# Patient Record
Sex: Male | Born: 1935
Health system: Southern US, Community
[De-identification: ages and names within clinical notes are randomized; demographics above are authoritative.]

## PROBLEM LIST (undated history)

## (undated) DIAGNOSIS — U071 COVID-19: Secondary | ICD-10-CM

## (undated) DIAGNOSIS — F0390 Unspecified dementia without behavioral disturbance: Secondary | ICD-10-CM

## (undated) DIAGNOSIS — J449 Chronic obstructive pulmonary disease, unspecified: Secondary | ICD-10-CM

## (undated) DIAGNOSIS — E785 Hyperlipidemia, unspecified: Secondary | ICD-10-CM

## (undated) DIAGNOSIS — J342 Deviated nasal septum: Secondary | ICD-10-CM

## (undated) DIAGNOSIS — R911 Solitary pulmonary nodule: Secondary | ICD-10-CM

## (undated) DIAGNOSIS — K219 Gastro-esophageal reflux disease without esophagitis: Secondary | ICD-10-CM

## (undated) DIAGNOSIS — N400 Enlarged prostate without lower urinary tract symptoms: Secondary | ICD-10-CM

## (undated) DIAGNOSIS — H269 Unspecified cataract: Secondary | ICD-10-CM

## (undated) DIAGNOSIS — S42009A Fracture of unspecified part of unspecified clavicle, initial encounter for closed fracture: Secondary | ICD-10-CM

## (undated) DIAGNOSIS — H9319 Tinnitus, unspecified ear: Secondary | ICD-10-CM

## (undated) DIAGNOSIS — H9190 Unspecified hearing loss, unspecified ear: Secondary | ICD-10-CM

## (undated) HISTORY — PX: SHOULDER SURGERY: SHX246

## (undated) HISTORY — PX: APPENDECTOMY: SHX54

---

## 2000-12-30 ENCOUNTER — Other Ambulatory Visit: Admission: RE | Admit: 2000-12-30 | Discharge: 2000-12-30 | Payer: Self-pay | Admitting: Gastroenterology

## 2000-12-30 ENCOUNTER — Encounter (INDEPENDENT_AMBULATORY_CARE_PROVIDER_SITE_OTHER): Payer: Self-pay | Admitting: Specialist

## 2007-01-07 ENCOUNTER — Encounter: Admission: RE | Admit: 2007-01-07 | Discharge: 2007-01-07 | Payer: Self-pay | Admitting: Family Medicine

## 2010-05-09 ENCOUNTER — Encounter (INDEPENDENT_AMBULATORY_CARE_PROVIDER_SITE_OTHER): Payer: Self-pay | Admitting: Surgery

## 2010-07-03 ENCOUNTER — Observation Stay (HOSPITAL_COMMUNITY): Admission: EM | Admit: 2010-07-03 | Discharge: 2010-05-11 | Payer: Self-pay | Admitting: Emergency Medicine

## 2010-08-12 ENCOUNTER — Other Ambulatory Visit: Payer: Self-pay | Admitting: Gastroenterology

## 2010-10-09 LAB — COMPREHENSIVE METABOLIC PANEL
ALT: 16 U/L (ref 0–53)
Albumin: 4.1 g/dL (ref 3.5–5.2)
Alkaline Phosphatase: 78 U/L (ref 39–117)
Calcium: 8.8 mg/dL (ref 8.4–10.5)
GFR calc Af Amer: >60 mL/min (ref >60)
Potassium: 4.1 mEq/L (ref 3.5–5.1)
Sodium: 134 mEq/L — ABNORMAL LOW (ref 135–145)
Total Protein: 6.9 g/dL (ref 6.0–8.3)

## 2010-10-09 LAB — CBC
HCT: 45.8 % (ref 39.0–52.0)
MCHC: 34.1 g/dL (ref 30.0–36.0)
Platelets: 201 10*3/uL (ref 150–400)
RDW: 13.3 % (ref 11.5–15.5)
WBC: 11.6 10*3/uL — ABNORMAL HIGH (ref 4.0–10.5)

## 2010-10-09 LAB — URINE MICROSCOPIC-ADD ON

## 2010-10-09 LAB — URINALYSIS, ROUTINE W REFLEX MICROSCOPIC
Bilirubin Urine: NEGATIVE
Glucose, UA: NEGATIVE mg/dL
Hgb urine dipstick: NEGATIVE
Specific Gravity, Urine: 1.025 (ref 1.005–1.030)
Urobilinogen, UA: 1 mg/dL (ref 0.0–1.0)

## 2010-10-09 LAB — DIFFERENTIAL
Basophils Relative: 0 % (ref 0–1)
Eosinophils Absolute: 0 10*3/uL (ref 0.0–0.7)
Lymphs Abs: 0.4 10*3/uL — ABNORMAL LOW (ref 0.7–4.0)
Monocytes Absolute: 0.3 10*3/uL (ref 0.1–1.0)
Monocytes Relative: 2 % — ABNORMAL LOW (ref 3–12)

## 2011-01-14 ENCOUNTER — Other Ambulatory Visit: Payer: Self-pay | Admitting: Dermatology

## 2011-02-10 ENCOUNTER — Encounter: Payer: Self-pay | Admitting: Gastroenterology

## 2011-04-14 ENCOUNTER — Other Ambulatory Visit: Payer: Self-pay | Admitting: Dermatology

## 2011-08-03 DIAGNOSIS — H6980 Other specified disorders of Eustachian tube, unspecified ear: Secondary | ICD-10-CM | POA: Diagnosis not present

## 2011-08-03 DIAGNOSIS — H9319 Tinnitus, unspecified ear: Secondary | ICD-10-CM | POA: Diagnosis not present

## 2011-09-15 DIAGNOSIS — H9190 Unspecified hearing loss, unspecified ear: Secondary | ICD-10-CM | POA: Diagnosis not present

## 2011-10-07 DIAGNOSIS — H903 Sensorineural hearing loss, bilateral: Secondary | ICD-10-CM | POA: Diagnosis not present

## 2011-10-13 DIAGNOSIS — L570 Actinic keratosis: Secondary | ICD-10-CM | POA: Diagnosis not present

## 2011-10-13 DIAGNOSIS — L821 Other seborrheic keratosis: Secondary | ICD-10-CM | POA: Diagnosis not present

## 2011-10-13 DIAGNOSIS — Z85828 Personal history of other malignant neoplasm of skin: Secondary | ICD-10-CM | POA: Diagnosis not present

## 2011-10-13 DIAGNOSIS — Z85820 Personal history of malignant melanoma of skin: Secondary | ICD-10-CM | POA: Diagnosis not present

## 2012-01-06 DIAGNOSIS — L821 Other seborrheic keratosis: Secondary | ICD-10-CM | POA: Diagnosis not present

## 2012-01-06 DIAGNOSIS — Z85820 Personal history of malignant melanoma of skin: Secondary | ICD-10-CM | POA: Diagnosis not present

## 2012-01-06 DIAGNOSIS — L570 Actinic keratosis: Secondary | ICD-10-CM | POA: Diagnosis not present

## 2012-02-10 DIAGNOSIS — H905 Unspecified sensorineural hearing loss: Secondary | ICD-10-CM | POA: Diagnosis not present

## 2012-02-10 DIAGNOSIS — H6980 Other specified disorders of Eustachian tube, unspecified ear: Secondary | ICD-10-CM | POA: Diagnosis not present

## 2012-02-10 DIAGNOSIS — J329 Chronic sinusitis, unspecified: Secondary | ICD-10-CM | POA: Diagnosis not present

## 2012-02-10 DIAGNOSIS — J301 Allergic rhinitis due to pollen: Secondary | ICD-10-CM | POA: Diagnosis not present

## 2012-02-24 ENCOUNTER — Other Ambulatory Visit: Payer: Self-pay | Admitting: *Deleted

## 2012-02-24 ENCOUNTER — Ambulatory Visit
Admission: RE | Admit: 2012-02-24 | Discharge: 2012-02-24 | Disposition: A | Discharge status: Home / Self Care | Payer: Medicare Other | Source: Ambulatory Visit | Attending: *Deleted | Admitting: *Deleted

## 2012-02-24 DIAGNOSIS — M79609 Pain in unspecified limb: Secondary | ICD-10-CM | POA: Diagnosis not present

## 2012-02-24 DIAGNOSIS — R609 Edema, unspecified: Secondary | ICD-10-CM | POA: Diagnosis not present

## 2012-02-24 DIAGNOSIS — M7989 Other specified soft tissue disorders: Secondary | ICD-10-CM | POA: Diagnosis not present

## 2012-04-11 DIAGNOSIS — Z85820 Personal history of malignant melanoma of skin: Secondary | ICD-10-CM | POA: Diagnosis not present

## 2012-04-11 DIAGNOSIS — L570 Actinic keratosis: Secondary | ICD-10-CM | POA: Diagnosis not present

## 2012-04-11 DIAGNOSIS — L821 Other seborrheic keratosis: Secondary | ICD-10-CM | POA: Diagnosis not present

## 2012-04-11 DIAGNOSIS — D485 Neoplasm of uncertain behavior of skin: Secondary | ICD-10-CM | POA: Diagnosis not present

## 2012-04-14 ENCOUNTER — Encounter: Payer: Self-pay | Admitting: Gastroenterology

## 2012-05-04 DIAGNOSIS — L570 Actinic keratosis: Secondary | ICD-10-CM | POA: Diagnosis not present

## 2012-05-04 DIAGNOSIS — J310 Chronic rhinitis: Secondary | ICD-10-CM | POA: Diagnosis not present

## 2012-05-04 DIAGNOSIS — H9190 Unspecified hearing loss, unspecified ear: Secondary | ICD-10-CM | POA: Diagnosis not present

## 2012-05-04 DIAGNOSIS — Z0000 Encounter for general adult medical examination without abnormal findings: Secondary | ICD-10-CM | POA: Diagnosis not present

## 2012-05-04 DIAGNOSIS — Z23 Encounter for immunization: Secondary | ICD-10-CM | POA: Diagnosis not present

## 2012-05-04 DIAGNOSIS — Z86010 Personal history of colonic polyps: Secondary | ICD-10-CM | POA: Diagnosis not present

## 2012-05-04 DIAGNOSIS — Z1331 Encounter for screening for depression: Secondary | ICD-10-CM | POA: Diagnosis not present

## 2012-05-04 DIAGNOSIS — R351 Nocturia: Secondary | ICD-10-CM | POA: Diagnosis not present

## 2012-05-04 DIAGNOSIS — E785 Hyperlipidemia, unspecified: Secondary | ICD-10-CM | POA: Diagnosis not present

## 2012-07-21 DIAGNOSIS — L821 Other seborrheic keratosis: Secondary | ICD-10-CM | POA: Diagnosis not present

## 2012-07-21 DIAGNOSIS — L570 Actinic keratosis: Secondary | ICD-10-CM | POA: Diagnosis not present

## 2012-07-21 DIAGNOSIS — D485 Neoplasm of uncertain behavior of skin: Secondary | ICD-10-CM | POA: Diagnosis not present

## 2012-07-21 DIAGNOSIS — C44621 Squamous cell carcinoma of skin of unspecified upper limb, including shoulder: Secondary | ICD-10-CM | POA: Diagnosis not present

## 2012-07-29 DIAGNOSIS — H6980 Other specified disorders of Eustachian tube, unspecified ear: Secondary | ICD-10-CM | POA: Diagnosis not present

## 2012-07-29 DIAGNOSIS — H9319 Tinnitus, unspecified ear: Secondary | ICD-10-CM | POA: Diagnosis not present

## 2012-07-29 DIAGNOSIS — J3489 Other specified disorders of nose and nasal sinuses: Secondary | ICD-10-CM | POA: Diagnosis not present

## 2012-07-29 DIAGNOSIS — H903 Sensorineural hearing loss, bilateral: Secondary | ICD-10-CM | POA: Insufficient documentation

## 2012-07-29 DIAGNOSIS — J342 Deviated nasal septum: Secondary | ICD-10-CM | POA: Insufficient documentation

## 2012-07-29 DIAGNOSIS — J343 Hypertrophy of nasal turbinates: Secondary | ICD-10-CM | POA: Diagnosis not present

## 2012-07-29 DIAGNOSIS — H6990 Unspecified Eustachian tube disorder, unspecified ear: Secondary | ICD-10-CM | POA: Insufficient documentation

## 2012-08-05 DIAGNOSIS — H6980 Other specified disorders of Eustachian tube, unspecified ear: Secondary | ICD-10-CM | POA: Diagnosis not present

## 2012-08-05 DIAGNOSIS — J301 Allergic rhinitis due to pollen: Secondary | ICD-10-CM | POA: Diagnosis not present

## 2012-08-11 DIAGNOSIS — I498 Other specified cardiac arrhythmias: Secondary | ICD-10-CM | POA: Diagnosis not present

## 2012-08-11 DIAGNOSIS — Z01810 Encounter for preprocedural cardiovascular examination: Secondary | ICD-10-CM | POA: Diagnosis not present

## 2012-08-17 DIAGNOSIS — J343 Hypertrophy of nasal turbinates: Secondary | ICD-10-CM | POA: Diagnosis not present

## 2012-08-17 DIAGNOSIS — J342 Deviated nasal septum: Secondary | ICD-10-CM | POA: Diagnosis not present

## 2012-08-22 DIAGNOSIS — J343 Hypertrophy of nasal turbinates: Secondary | ICD-10-CM | POA: Diagnosis not present

## 2012-08-22 DIAGNOSIS — J342 Deviated nasal septum: Secondary | ICD-10-CM | POA: Diagnosis not present

## 2012-08-22 DIAGNOSIS — H9319 Tinnitus, unspecified ear: Secondary | ICD-10-CM | POA: Diagnosis not present

## 2012-08-22 DIAGNOSIS — J3489 Other specified disorders of nose and nasal sinuses: Secondary | ICD-10-CM | POA: Diagnosis not present

## 2012-08-22 DIAGNOSIS — H6980 Other specified disorders of Eustachian tube, unspecified ear: Secondary | ICD-10-CM | POA: Diagnosis not present

## 2012-08-22 DIAGNOSIS — H903 Sensorineural hearing loss, bilateral: Secondary | ICD-10-CM | POA: Diagnosis not present

## 2012-08-26 DIAGNOSIS — H903 Sensorineural hearing loss, bilateral: Secondary | ICD-10-CM | POA: Diagnosis not present

## 2012-10-05 ENCOUNTER — Other Ambulatory Visit: Payer: Self-pay | Admitting: Dermatology

## 2012-10-05 DIAGNOSIS — Z85828 Personal history of other malignant neoplasm of skin: Secondary | ICD-10-CM | POA: Diagnosis not present

## 2012-10-05 DIAGNOSIS — L820 Inflamed seborrheic keratosis: Secondary | ICD-10-CM | POA: Diagnosis not present

## 2012-10-05 DIAGNOSIS — D485 Neoplasm of uncertain behavior of skin: Secondary | ICD-10-CM | POA: Diagnosis not present

## 2012-10-05 DIAGNOSIS — D235 Other benign neoplasm of skin of trunk: Secondary | ICD-10-CM | POA: Diagnosis not present

## 2012-10-05 DIAGNOSIS — L570 Actinic keratosis: Secondary | ICD-10-CM | POA: Diagnosis not present

## 2012-10-20 DIAGNOSIS — H918X9 Other specified hearing loss, unspecified ear: Secondary | ICD-10-CM | POA: Diagnosis not present

## 2012-10-20 DIAGNOSIS — J3489 Other specified disorders of nose and nasal sinuses: Secondary | ICD-10-CM | POA: Diagnosis not present

## 2012-10-20 DIAGNOSIS — H6980 Other specified disorders of Eustachian tube, unspecified ear: Secondary | ICD-10-CM | POA: Diagnosis not present

## 2012-10-20 DIAGNOSIS — H9319 Tinnitus, unspecified ear: Secondary | ICD-10-CM | POA: Diagnosis not present

## 2013-01-05 DIAGNOSIS — L821 Other seborrheic keratosis: Secondary | ICD-10-CM | POA: Diagnosis not present

## 2013-01-05 DIAGNOSIS — Z85820 Personal history of malignant melanoma of skin: Secondary | ICD-10-CM | POA: Diagnosis not present

## 2013-01-05 DIAGNOSIS — L570 Actinic keratosis: Secondary | ICD-10-CM | POA: Diagnosis not present

## 2013-01-26 DIAGNOSIS — H2510 Age-related nuclear cataract, unspecified eye: Secondary | ICD-10-CM | POA: Diagnosis not present

## 2013-04-04 DIAGNOSIS — H6980 Other specified disorders of Eustachian tube, unspecified ear: Secondary | ICD-10-CM | POA: Diagnosis not present

## 2013-04-04 DIAGNOSIS — H903 Sensorineural hearing loss, bilateral: Secondary | ICD-10-CM | POA: Diagnosis not present

## 2013-04-04 DIAGNOSIS — J343 Hypertrophy of nasal turbinates: Secondary | ICD-10-CM | POA: Diagnosis not present

## 2013-04-04 DIAGNOSIS — J342 Deviated nasal septum: Secondary | ICD-10-CM | POA: Diagnosis not present

## 2013-04-04 DIAGNOSIS — H9319 Tinnitus, unspecified ear: Secondary | ICD-10-CM | POA: Diagnosis not present

## 2013-04-04 DIAGNOSIS — J3489 Other specified disorders of nose and nasal sinuses: Secondary | ICD-10-CM | POA: Diagnosis not present

## 2013-04-12 DIAGNOSIS — L570 Actinic keratosis: Secondary | ICD-10-CM | POA: Diagnosis not present

## 2013-04-12 DIAGNOSIS — Z85820 Personal history of malignant melanoma of skin: Secondary | ICD-10-CM | POA: Diagnosis not present

## 2013-04-12 DIAGNOSIS — L820 Inflamed seborrheic keratosis: Secondary | ICD-10-CM | POA: Diagnosis not present

## 2013-04-12 DIAGNOSIS — L821 Other seborrheic keratosis: Secondary | ICD-10-CM | POA: Diagnosis not present

## 2013-05-08 DIAGNOSIS — R351 Nocturia: Secondary | ICD-10-CM | POA: Diagnosis not present

## 2013-05-08 DIAGNOSIS — Z79899 Other long term (current) drug therapy: Secondary | ICD-10-CM | POA: Diagnosis not present

## 2013-05-08 DIAGNOSIS — Z86010 Personal history of colonic polyps: Secondary | ICD-10-CM | POA: Diagnosis not present

## 2013-05-08 DIAGNOSIS — L570 Actinic keratosis: Secondary | ICD-10-CM | POA: Diagnosis not present

## 2013-05-08 DIAGNOSIS — M1710 Unilateral primary osteoarthritis, unspecified knee: Secondary | ICD-10-CM | POA: Diagnosis not present

## 2013-05-08 DIAGNOSIS — Z1331 Encounter for screening for depression: Secondary | ICD-10-CM | POA: Diagnosis not present

## 2013-05-08 DIAGNOSIS — Z0000 Encounter for general adult medical examination without abnormal findings: Secondary | ICD-10-CM | POA: Diagnosis not present

## 2013-05-08 DIAGNOSIS — K5730 Diverticulosis of large intestine without perforation or abscess without bleeding: Secondary | ICD-10-CM | POA: Diagnosis not present

## 2013-05-08 DIAGNOSIS — E785 Hyperlipidemia, unspecified: Secondary | ICD-10-CM | POA: Diagnosis not present

## 2013-05-23 DIAGNOSIS — Z23 Encounter for immunization: Secondary | ICD-10-CM | POA: Diagnosis not present

## 2013-05-29 DIAGNOSIS — H903 Sensorineural hearing loss, bilateral: Secondary | ICD-10-CM | POA: Diagnosis not present

## 2013-07-03 DIAGNOSIS — Z85820 Personal history of malignant melanoma of skin: Secondary | ICD-10-CM | POA: Diagnosis not present

## 2013-07-03 DIAGNOSIS — L570 Actinic keratosis: Secondary | ICD-10-CM | POA: Diagnosis not present

## 2013-07-03 DIAGNOSIS — B353 Tinea pedis: Secondary | ICD-10-CM | POA: Diagnosis not present

## 2013-07-03 DIAGNOSIS — Z85828 Personal history of other malignant neoplasm of skin: Secondary | ICD-10-CM | POA: Diagnosis not present

## 2013-08-31 DIAGNOSIS — H9319 Tinnitus, unspecified ear: Secondary | ICD-10-CM | POA: Diagnosis not present

## 2013-08-31 DIAGNOSIS — H903 Sensorineural hearing loss, bilateral: Secondary | ICD-10-CM | POA: Diagnosis not present

## 2013-08-31 DIAGNOSIS — H6980 Other specified disorders of Eustachian tube, unspecified ear: Secondary | ICD-10-CM | POA: Diagnosis not present

## 2013-10-05 DIAGNOSIS — D485 Neoplasm of uncertain behavior of skin: Secondary | ICD-10-CM | POA: Diagnosis not present

## 2013-10-05 DIAGNOSIS — B353 Tinea pedis: Secondary | ICD-10-CM | POA: Diagnosis not present

## 2013-10-05 DIAGNOSIS — Z85820 Personal history of malignant melanoma of skin: Secondary | ICD-10-CM | POA: Diagnosis not present

## 2013-10-05 DIAGNOSIS — L570 Actinic keratosis: Secondary | ICD-10-CM | POA: Diagnosis not present

## 2013-10-05 DIAGNOSIS — Z85828 Personal history of other malignant neoplasm of skin: Secondary | ICD-10-CM | POA: Diagnosis not present

## 2013-11-29 DIAGNOSIS — H6980 Other specified disorders of Eustachian tube, unspecified ear: Secondary | ICD-10-CM | POA: Diagnosis not present

## 2013-11-29 DIAGNOSIS — J310 Chronic rhinitis: Secondary | ICD-10-CM | POA: Diagnosis not present

## 2014-01-01 ENCOUNTER — Other Ambulatory Visit: Payer: Self-pay | Admitting: Dermatology

## 2014-01-01 DIAGNOSIS — Z85820 Personal history of malignant melanoma of skin: Secondary | ICD-10-CM | POA: Diagnosis not present

## 2014-01-01 DIAGNOSIS — Z85828 Personal history of other malignant neoplasm of skin: Secondary | ICD-10-CM | POA: Diagnosis not present

## 2014-01-01 DIAGNOSIS — D485 Neoplasm of uncertain behavior of skin: Secondary | ICD-10-CM | POA: Diagnosis not present

## 2014-01-01 DIAGNOSIS — C44721 Squamous cell carcinoma of skin of unspecified lower limb, including hip: Secondary | ICD-10-CM | POA: Diagnosis not present

## 2014-01-01 DIAGNOSIS — L821 Other seborrheic keratosis: Secondary | ICD-10-CM | POA: Diagnosis not present

## 2014-01-01 DIAGNOSIS — L570 Actinic keratosis: Secondary | ICD-10-CM | POA: Diagnosis not present

## 2014-01-30 DIAGNOSIS — H903 Sensorineural hearing loss, bilateral: Secondary | ICD-10-CM | POA: Diagnosis not present

## 2014-01-30 DIAGNOSIS — J310 Chronic rhinitis: Secondary | ICD-10-CM | POA: Diagnosis not present

## 2014-01-30 DIAGNOSIS — H6980 Other specified disorders of Eustachian tube, unspecified ear: Secondary | ICD-10-CM | POA: Diagnosis not present

## 2014-01-30 DIAGNOSIS — J309 Allergic rhinitis, unspecified: Secondary | ICD-10-CM | POA: Diagnosis not present

## 2014-01-31 DIAGNOSIS — H2510 Age-related nuclear cataract, unspecified eye: Secondary | ICD-10-CM | POA: Diagnosis not present

## 2014-02-20 DIAGNOSIS — H6980 Other specified disorders of Eustachian tube, unspecified ear: Secondary | ICD-10-CM | POA: Diagnosis not present

## 2014-02-20 DIAGNOSIS — H903 Sensorineural hearing loss, bilateral: Secondary | ICD-10-CM | POA: Diagnosis not present

## 2014-03-12 DIAGNOSIS — J343 Hypertrophy of nasal turbinates: Secondary | ICD-10-CM | POA: Diagnosis not present

## 2014-03-12 DIAGNOSIS — H9319 Tinnitus, unspecified ear: Secondary | ICD-10-CM | POA: Diagnosis not present

## 2014-03-12 DIAGNOSIS — J342 Deviated nasal septum: Secondary | ICD-10-CM | POA: Diagnosis not present

## 2014-03-12 DIAGNOSIS — J3489 Other specified disorders of nose and nasal sinuses: Secondary | ICD-10-CM | POA: Diagnosis not present

## 2014-03-12 DIAGNOSIS — H903 Sensorineural hearing loss, bilateral: Secondary | ICD-10-CM | POA: Diagnosis not present

## 2014-03-12 DIAGNOSIS — H6980 Other specified disorders of Eustachian tube, unspecified ear: Secondary | ICD-10-CM | POA: Diagnosis not present

## 2014-04-04 ENCOUNTER — Other Ambulatory Visit: Payer: Self-pay | Admitting: Dermatology

## 2014-04-04 DIAGNOSIS — D485 Neoplasm of uncertain behavior of skin: Secondary | ICD-10-CM | POA: Diagnosis not present

## 2014-04-04 DIAGNOSIS — L821 Other seborrheic keratosis: Secondary | ICD-10-CM | POA: Diagnosis not present

## 2014-04-04 DIAGNOSIS — L570 Actinic keratosis: Secondary | ICD-10-CM | POA: Diagnosis not present

## 2014-04-04 DIAGNOSIS — C44319 Basal cell carcinoma of skin of other parts of face: Secondary | ICD-10-CM | POA: Diagnosis not present

## 2014-04-04 DIAGNOSIS — Z85820 Personal history of malignant melanoma of skin: Secondary | ICD-10-CM | POA: Diagnosis not present

## 2014-04-04 DIAGNOSIS — Z85828 Personal history of other malignant neoplasm of skin: Secondary | ICD-10-CM | POA: Diagnosis not present

## 2014-05-04 DIAGNOSIS — Z23 Encounter for immunization: Secondary | ICD-10-CM | POA: Diagnosis not present

## 2014-05-08 DIAGNOSIS — R6889 Other general symptoms and signs: Secondary | ICD-10-CM | POA: Diagnosis not present

## 2014-05-30 DIAGNOSIS — Z0001 Encounter for general adult medical examination with abnormal findings: Secondary | ICD-10-CM | POA: Diagnosis not present

## 2014-05-30 DIAGNOSIS — Z23 Encounter for immunization: Secondary | ICD-10-CM | POA: Diagnosis not present

## 2014-05-30 DIAGNOSIS — H903 Sensorineural hearing loss, bilateral: Secondary | ICD-10-CM | POA: Diagnosis not present

## 2014-05-30 DIAGNOSIS — E785 Hyperlipidemia, unspecified: Secondary | ICD-10-CM | POA: Diagnosis not present

## 2014-05-30 DIAGNOSIS — Z1389 Encounter for screening for other disorder: Secondary | ICD-10-CM | POA: Diagnosis not present

## 2014-05-30 DIAGNOSIS — R351 Nocturia: Secondary | ICD-10-CM | POA: Diagnosis not present

## 2014-05-30 DIAGNOSIS — K5730 Diverticulosis of large intestine without perforation or abscess without bleeding: Secondary | ICD-10-CM | POA: Diagnosis not present

## 2014-05-30 DIAGNOSIS — M179 Osteoarthritis of knee, unspecified: Secondary | ICD-10-CM | POA: Diagnosis not present

## 2014-05-30 DIAGNOSIS — J310 Chronic rhinitis: Secondary | ICD-10-CM | POA: Diagnosis not present

## 2014-05-30 DIAGNOSIS — Z86010 Personal history of colonic polyps: Secondary | ICD-10-CM | POA: Diagnosis not present

## 2014-06-27 ENCOUNTER — Other Ambulatory Visit: Payer: Self-pay | Admitting: Dermatology

## 2014-06-27 DIAGNOSIS — L570 Actinic keratosis: Secondary | ICD-10-CM | POA: Diagnosis not present

## 2014-06-27 DIAGNOSIS — Z85828 Personal history of other malignant neoplasm of skin: Secondary | ICD-10-CM | POA: Diagnosis not present

## 2014-06-27 DIAGNOSIS — L821 Other seborrheic keratosis: Secondary | ICD-10-CM | POA: Diagnosis not present

## 2014-06-27 DIAGNOSIS — C44629 Squamous cell carcinoma of skin of left upper limb, including shoulder: Secondary | ICD-10-CM | POA: Diagnosis not present

## 2014-06-27 DIAGNOSIS — Z85820 Personal history of malignant melanoma of skin: Secondary | ICD-10-CM | POA: Diagnosis not present

## 2014-07-10 DIAGNOSIS — H903 Sensorineural hearing loss, bilateral: Secondary | ICD-10-CM | POA: Diagnosis not present

## 2014-07-10 DIAGNOSIS — H9313 Tinnitus, bilateral: Secondary | ICD-10-CM | POA: Diagnosis not present

## 2014-09-25 DIAGNOSIS — J343 Hypertrophy of nasal turbinates: Secondary | ICD-10-CM | POA: Diagnosis not present

## 2014-09-25 DIAGNOSIS — H9313 Tinnitus, bilateral: Secondary | ICD-10-CM | POA: Diagnosis not present

## 2014-09-25 DIAGNOSIS — J342 Deviated nasal septum: Secondary | ICD-10-CM | POA: Diagnosis not present

## 2014-09-25 DIAGNOSIS — J3489 Other specified disorders of nose and nasal sinuses: Secondary | ICD-10-CM | POA: Diagnosis not present

## 2014-09-25 DIAGNOSIS — H6983 Other specified disorders of Eustachian tube, bilateral: Secondary | ICD-10-CM | POA: Diagnosis not present

## 2014-09-25 DIAGNOSIS — H903 Sensorineural hearing loss, bilateral: Secondary | ICD-10-CM | POA: Diagnosis not present

## 2014-09-26 DIAGNOSIS — Z85820 Personal history of malignant melanoma of skin: Secondary | ICD-10-CM | POA: Diagnosis not present

## 2014-09-26 DIAGNOSIS — L570 Actinic keratosis: Secondary | ICD-10-CM | POA: Diagnosis not present

## 2014-09-26 DIAGNOSIS — L821 Other seborrheic keratosis: Secondary | ICD-10-CM | POA: Diagnosis not present

## 2014-09-26 DIAGNOSIS — Z85828 Personal history of other malignant neoplasm of skin: Secondary | ICD-10-CM | POA: Diagnosis not present

## 2014-11-01 DIAGNOSIS — J310 Chronic rhinitis: Secondary | ICD-10-CM | POA: Diagnosis not present

## 2014-11-01 DIAGNOSIS — H903 Sensorineural hearing loss, bilateral: Secondary | ICD-10-CM | POA: Diagnosis not present

## 2014-11-01 DIAGNOSIS — J45909 Unspecified asthma, uncomplicated: Secondary | ICD-10-CM | POA: Diagnosis not present

## 2014-11-30 DIAGNOSIS — R351 Nocturia: Secondary | ICD-10-CM | POA: Diagnosis not present

## 2014-11-30 DIAGNOSIS — J310 Chronic rhinitis: Secondary | ICD-10-CM | POA: Diagnosis not present

## 2014-11-30 DIAGNOSIS — J45909 Unspecified asthma, uncomplicated: Secondary | ICD-10-CM | POA: Diagnosis not present

## 2014-11-30 DIAGNOSIS — H903 Sensorineural hearing loss, bilateral: Secondary | ICD-10-CM | POA: Diagnosis not present

## 2014-11-30 DIAGNOSIS — E785 Hyperlipidemia, unspecified: Secondary | ICD-10-CM | POA: Diagnosis not present

## 2014-12-12 DIAGNOSIS — M25511 Pain in right shoulder: Secondary | ICD-10-CM | POA: Diagnosis not present

## 2014-12-17 DIAGNOSIS — M25511 Pain in right shoulder: Secondary | ICD-10-CM | POA: Diagnosis not present

## 2014-12-27 DIAGNOSIS — Z85828 Personal history of other malignant neoplasm of skin: Secondary | ICD-10-CM | POA: Diagnosis not present

## 2014-12-27 DIAGNOSIS — C44319 Basal cell carcinoma of skin of other parts of face: Secondary | ICD-10-CM | POA: Diagnosis not present

## 2014-12-27 DIAGNOSIS — Z85820 Personal history of malignant melanoma of skin: Secondary | ICD-10-CM | POA: Diagnosis not present

## 2014-12-27 DIAGNOSIS — L570 Actinic keratosis: Secondary | ICD-10-CM | POA: Diagnosis not present

## 2014-12-27 DIAGNOSIS — L821 Other seborrheic keratosis: Secondary | ICD-10-CM | POA: Diagnosis not present

## 2014-12-27 DIAGNOSIS — C4442 Squamous cell carcinoma of skin of scalp and neck: Secondary | ICD-10-CM | POA: Diagnosis not present

## 2014-12-27 DIAGNOSIS — D485 Neoplasm of uncertain behavior of skin: Secondary | ICD-10-CM | POA: Diagnosis not present

## 2015-01-01 DIAGNOSIS — M25511 Pain in right shoulder: Secondary | ICD-10-CM | POA: Diagnosis not present

## 2015-02-04 DIAGNOSIS — H2513 Age-related nuclear cataract, bilateral: Secondary | ICD-10-CM | POA: Diagnosis not present

## 2015-03-11 DIAGNOSIS — Z09 Encounter for follow-up examination after completed treatment for conditions other than malignant neoplasm: Secondary | ICD-10-CM | POA: Diagnosis not present

## 2015-03-11 DIAGNOSIS — H918X9 Other specified hearing loss, unspecified ear: Secondary | ICD-10-CM | POA: Diagnosis not present

## 2015-03-11 DIAGNOSIS — Z974 Presence of external hearing-aid: Secondary | ICD-10-CM | POA: Diagnosis not present

## 2015-04-04 DIAGNOSIS — Z85828 Personal history of other malignant neoplasm of skin: Secondary | ICD-10-CM | POA: Diagnosis not present

## 2015-04-04 DIAGNOSIS — Z85820 Personal history of malignant melanoma of skin: Secondary | ICD-10-CM | POA: Diagnosis not present

## 2015-04-04 DIAGNOSIS — L821 Other seborrheic keratosis: Secondary | ICD-10-CM | POA: Diagnosis not present

## 2015-04-04 DIAGNOSIS — B351 Tinea unguium: Secondary | ICD-10-CM | POA: Diagnosis not present

## 2015-04-04 DIAGNOSIS — L570 Actinic keratosis: Secondary | ICD-10-CM | POA: Diagnosis not present

## 2015-07-02 DIAGNOSIS — M179 Osteoarthritis of knee, unspecified: Secondary | ICD-10-CM | POA: Diagnosis not present

## 2015-07-02 DIAGNOSIS — J310 Chronic rhinitis: Secondary | ICD-10-CM | POA: Diagnosis not present

## 2015-07-02 DIAGNOSIS — J45909 Unspecified asthma, uncomplicated: Secondary | ICD-10-CM | POA: Diagnosis not present

## 2015-07-02 DIAGNOSIS — Z125 Encounter for screening for malignant neoplasm of prostate: Secondary | ICD-10-CM | POA: Diagnosis not present

## 2015-07-02 DIAGNOSIS — H903 Sensorineural hearing loss, bilateral: Secondary | ICD-10-CM | POA: Diagnosis not present

## 2015-07-02 DIAGNOSIS — R351 Nocturia: Secondary | ICD-10-CM | POA: Diagnosis not present

## 2015-07-02 DIAGNOSIS — Z0001 Encounter for general adult medical examination with abnormal findings: Secondary | ICD-10-CM | POA: Diagnosis not present

## 2015-07-02 DIAGNOSIS — E785 Hyperlipidemia, unspecified: Secondary | ICD-10-CM | POA: Diagnosis not present

## 2015-07-02 DIAGNOSIS — K5730 Diverticulosis of large intestine without perforation or abscess without bleeding: Secondary | ICD-10-CM | POA: Diagnosis not present

## 2015-07-02 DIAGNOSIS — Z1389 Encounter for screening for other disorder: Secondary | ICD-10-CM | POA: Diagnosis not present

## 2015-07-02 DIAGNOSIS — Z79899 Other long term (current) drug therapy: Secondary | ICD-10-CM | POA: Diagnosis not present

## 2015-07-04 DIAGNOSIS — Z85828 Personal history of other malignant neoplasm of skin: Secondary | ICD-10-CM | POA: Diagnosis not present

## 2015-07-04 DIAGNOSIS — Z85820 Personal history of malignant melanoma of skin: Secondary | ICD-10-CM | POA: Diagnosis not present

## 2015-07-04 DIAGNOSIS — L821 Other seborrheic keratosis: Secondary | ICD-10-CM | POA: Diagnosis not present

## 2015-07-04 DIAGNOSIS — L570 Actinic keratosis: Secondary | ICD-10-CM | POA: Diagnosis not present

## 2015-07-04 DIAGNOSIS — L814 Other melanin hyperpigmentation: Secondary | ICD-10-CM | POA: Diagnosis not present

## 2015-07-11 DIAGNOSIS — Z0110 Encounter for examination of ears and hearing without abnormal findings: Secondary | ICD-10-CM | POA: Diagnosis not present

## 2015-07-11 DIAGNOSIS — H903 Sensorineural hearing loss, bilateral: Secondary | ICD-10-CM | POA: Diagnosis not present

## 2015-07-11 DIAGNOSIS — J342 Deviated nasal septum: Secondary | ICD-10-CM | POA: Diagnosis not present

## 2015-07-11 DIAGNOSIS — H9313 Tinnitus, bilateral: Secondary | ICD-10-CM | POA: Diagnosis not present

## 2015-07-11 DIAGNOSIS — J3489 Other specified disorders of nose and nasal sinuses: Secondary | ICD-10-CM | POA: Diagnosis not present

## 2015-07-11 DIAGNOSIS — J343 Hypertrophy of nasal turbinates: Secondary | ICD-10-CM | POA: Diagnosis not present

## 2015-07-11 DIAGNOSIS — Z9889 Other specified postprocedural states: Secondary | ICD-10-CM | POA: Diagnosis not present

## 2015-08-09 DIAGNOSIS — J209 Acute bronchitis, unspecified: Secondary | ICD-10-CM | POA: Diagnosis not present

## 2015-08-19 DIAGNOSIS — M25561 Pain in right knee: Secondary | ICD-10-CM | POA: Diagnosis not present

## 2015-09-26 DIAGNOSIS — H6983 Other specified disorders of Eustachian tube, bilateral: Secondary | ICD-10-CM | POA: Diagnosis not present

## 2015-09-26 DIAGNOSIS — J342 Deviated nasal septum: Secondary | ICD-10-CM | POA: Diagnosis not present

## 2015-09-26 DIAGNOSIS — J3489 Other specified disorders of nose and nasal sinuses: Secondary | ICD-10-CM | POA: Diagnosis not present

## 2015-09-26 DIAGNOSIS — H9313 Tinnitus, bilateral: Secondary | ICD-10-CM | POA: Diagnosis not present

## 2015-09-26 DIAGNOSIS — Z7951 Long term (current) use of inhaled steroids: Secondary | ICD-10-CM | POA: Diagnosis not present

## 2015-09-26 DIAGNOSIS — H903 Sensorineural hearing loss, bilateral: Secondary | ICD-10-CM | POA: Diagnosis not present

## 2015-10-08 DIAGNOSIS — Z85820 Personal history of malignant melanoma of skin: Secondary | ICD-10-CM | POA: Diagnosis not present

## 2015-10-08 DIAGNOSIS — L570 Actinic keratosis: Secondary | ICD-10-CM | POA: Diagnosis not present

## 2015-10-08 DIAGNOSIS — Z85828 Personal history of other malignant neoplasm of skin: Secondary | ICD-10-CM | POA: Diagnosis not present

## 2015-10-08 DIAGNOSIS — L821 Other seborrheic keratosis: Secondary | ICD-10-CM | POA: Diagnosis not present

## 2015-12-31 DIAGNOSIS — J310 Chronic rhinitis: Secondary | ICD-10-CM | POA: Diagnosis not present

## 2015-12-31 DIAGNOSIS — K5730 Diverticulosis of large intestine without perforation or abscess without bleeding: Secondary | ICD-10-CM | POA: Diagnosis not present

## 2015-12-31 DIAGNOSIS — H903 Sensorineural hearing loss, bilateral: Secondary | ICD-10-CM | POA: Diagnosis not present

## 2015-12-31 DIAGNOSIS — R351 Nocturia: Secondary | ICD-10-CM | POA: Diagnosis not present

## 2015-12-31 DIAGNOSIS — Z1211 Encounter for screening for malignant neoplasm of colon: Secondary | ICD-10-CM | POA: Diagnosis not present

## 2016-01-08 DIAGNOSIS — L570 Actinic keratosis: Secondary | ICD-10-CM | POA: Diagnosis not present

## 2016-01-08 DIAGNOSIS — Z85828 Personal history of other malignant neoplasm of skin: Secondary | ICD-10-CM | POA: Diagnosis not present

## 2016-01-08 DIAGNOSIS — L821 Other seborrheic keratosis: Secondary | ICD-10-CM | POA: Diagnosis not present

## 2016-01-08 DIAGNOSIS — Z85820 Personal history of malignant melanoma of skin: Secondary | ICD-10-CM | POA: Diagnosis not present

## 2016-01-10 DIAGNOSIS — H903 Sensorineural hearing loss, bilateral: Secondary | ICD-10-CM | POA: Diagnosis not present

## 2016-01-16 DIAGNOSIS — E785 Hyperlipidemia, unspecified: Secondary | ICD-10-CM | POA: Insufficient documentation

## 2016-01-16 DIAGNOSIS — J330 Polyp of nasal cavity: Secondary | ICD-10-CM | POA: Insufficient documentation

## 2016-01-16 DIAGNOSIS — M179 Osteoarthritis of knee, unspecified: Secondary | ICD-10-CM | POA: Insufficient documentation

## 2016-01-16 DIAGNOSIS — Z86010 Personal history of colonic polyps: Secondary | ICD-10-CM | POA: Insufficient documentation

## 2016-01-16 DIAGNOSIS — Z79899 Other long term (current) drug therapy: Secondary | ICD-10-CM | POA: Insufficient documentation

## 2016-01-16 DIAGNOSIS — J310 Chronic rhinitis: Secondary | ICD-10-CM | POA: Diagnosis not present

## 2016-01-16 DIAGNOSIS — R351 Nocturia: Secondary | ICD-10-CM | POA: Insufficient documentation

## 2016-01-16 DIAGNOSIS — H9193 Unspecified hearing loss, bilateral: Secondary | ICD-10-CM | POA: Insufficient documentation

## 2016-01-16 DIAGNOSIS — J45909 Unspecified asthma, uncomplicated: Secondary | ICD-10-CM | POA: Insufficient documentation

## 2016-01-16 DIAGNOSIS — K5730 Diverticulosis of large intestine without perforation or abscess without bleeding: Secondary | ICD-10-CM | POA: Insufficient documentation

## 2016-01-16 DIAGNOSIS — E7800 Pure hypercholesterolemia, unspecified: Secondary | ICD-10-CM | POA: Insufficient documentation

## 2016-01-16 DIAGNOSIS — H903 Sensorineural hearing loss, bilateral: Secondary | ICD-10-CM | POA: Diagnosis not present

## 2016-01-16 DIAGNOSIS — M25519 Pain in unspecified shoulder: Secondary | ICD-10-CM | POA: Insufficient documentation

## 2016-01-16 DIAGNOSIS — J343 Hypertrophy of nasal turbinates: Secondary | ICD-10-CM | POA: Diagnosis not present

## 2016-01-16 DIAGNOSIS — Z9889 Other specified postprocedural states: Secondary | ICD-10-CM | POA: Diagnosis not present

## 2016-01-16 DIAGNOSIS — J309 Allergic rhinitis, unspecified: Secondary | ICD-10-CM | POA: Insufficient documentation

## 2016-01-16 DIAGNOSIS — H9313 Tinnitus, bilateral: Secondary | ICD-10-CM | POA: Diagnosis not present

## 2016-01-16 DIAGNOSIS — L570 Actinic keratosis: Secondary | ICD-10-CM | POA: Insufficient documentation

## 2016-01-16 DIAGNOSIS — Z881 Allergy status to other antibiotic agents status: Secondary | ICD-10-CM | POA: Diagnosis not present

## 2016-01-16 DIAGNOSIS — Z974 Presence of external hearing-aid: Secondary | ICD-10-CM | POA: Diagnosis not present

## 2016-01-16 DIAGNOSIS — R6889 Other general symptoms and signs: Secondary | ICD-10-CM | POA: Insufficient documentation

## 2016-01-16 DIAGNOSIS — J209 Acute bronchitis, unspecified: Secondary | ICD-10-CM | POA: Insufficient documentation

## 2016-01-16 DIAGNOSIS — H6983 Other specified disorders of Eustachian tube, bilateral: Secondary | ICD-10-CM | POA: Diagnosis not present

## 2016-01-16 DIAGNOSIS — R350 Frequency of micturition: Secondary | ICD-10-CM | POA: Insufficient documentation

## 2016-02-06 DIAGNOSIS — H2513 Age-related nuclear cataract, bilateral: Secondary | ICD-10-CM | POA: Diagnosis not present

## 2016-03-19 DIAGNOSIS — Z85828 Personal history of other malignant neoplasm of skin: Secondary | ICD-10-CM | POA: Diagnosis not present

## 2016-03-19 DIAGNOSIS — L720 Epidermal cyst: Secondary | ICD-10-CM | POA: Diagnosis not present

## 2016-03-19 DIAGNOSIS — Z85820 Personal history of malignant melanoma of skin: Secondary | ICD-10-CM | POA: Diagnosis not present

## 2016-04-09 DIAGNOSIS — L570 Actinic keratosis: Secondary | ICD-10-CM | POA: Diagnosis not present

## 2016-04-09 DIAGNOSIS — C44622 Squamous cell carcinoma of skin of right upper limb, including shoulder: Secondary | ICD-10-CM | POA: Diagnosis not present

## 2016-04-09 DIAGNOSIS — D1801 Hemangioma of skin and subcutaneous tissue: Secondary | ICD-10-CM | POA: Diagnosis not present

## 2016-04-09 DIAGNOSIS — Z85820 Personal history of malignant melanoma of skin: Secondary | ICD-10-CM | POA: Diagnosis not present

## 2016-04-09 DIAGNOSIS — Z85828 Personal history of other malignant neoplasm of skin: Secondary | ICD-10-CM | POA: Diagnosis not present

## 2016-04-09 DIAGNOSIS — D485 Neoplasm of uncertain behavior of skin: Secondary | ICD-10-CM | POA: Diagnosis not present

## 2016-04-09 DIAGNOSIS — L821 Other seborrheic keratosis: Secondary | ICD-10-CM | POA: Diagnosis not present

## 2016-07-08 DIAGNOSIS — L570 Actinic keratosis: Secondary | ICD-10-CM | POA: Diagnosis not present

## 2016-07-08 DIAGNOSIS — L814 Other melanin hyperpigmentation: Secondary | ICD-10-CM | POA: Diagnosis not present

## 2016-07-08 DIAGNOSIS — L821 Other seborrheic keratosis: Secondary | ICD-10-CM | POA: Diagnosis not present

## 2016-07-08 DIAGNOSIS — Z85820 Personal history of malignant melanoma of skin: Secondary | ICD-10-CM | POA: Diagnosis not present

## 2016-07-08 DIAGNOSIS — Z85828 Personal history of other malignant neoplasm of skin: Secondary | ICD-10-CM | POA: Diagnosis not present

## 2016-07-31 DIAGNOSIS — J342 Deviated nasal septum: Secondary | ICD-10-CM | POA: Diagnosis not present

## 2016-07-31 DIAGNOSIS — H903 Sensorineural hearing loss, bilateral: Secondary | ICD-10-CM | POA: Diagnosis not present

## 2016-07-31 DIAGNOSIS — H6983 Other specified disorders of Eustachian tube, bilateral: Secondary | ICD-10-CM | POA: Diagnosis not present

## 2016-07-31 DIAGNOSIS — H9313 Tinnitus, bilateral: Secondary | ICD-10-CM | POA: Diagnosis not present

## 2016-07-31 DIAGNOSIS — J3489 Other specified disorders of nose and nasal sinuses: Secondary | ICD-10-CM | POA: Diagnosis not present

## 2016-07-31 DIAGNOSIS — J343 Hypertrophy of nasal turbinates: Secondary | ICD-10-CM | POA: Diagnosis not present

## 2016-09-02 DIAGNOSIS — H903 Sensorineural hearing loss, bilateral: Secondary | ICD-10-CM | POA: Diagnosis not present

## 2016-09-02 DIAGNOSIS — Z1389 Encounter for screening for other disorder: Secondary | ICD-10-CM | POA: Diagnosis not present

## 2016-09-02 DIAGNOSIS — K5730 Diverticulosis of large intestine without perforation or abscess without bleeding: Secondary | ICD-10-CM | POA: Diagnosis not present

## 2016-09-02 DIAGNOSIS — M179 Osteoarthritis of knee, unspecified: Secondary | ICD-10-CM | POA: Diagnosis not present

## 2016-09-02 DIAGNOSIS — Z0000 Encounter for general adult medical examination without abnormal findings: Secondary | ICD-10-CM | POA: Diagnosis not present

## 2016-09-02 DIAGNOSIS — E785 Hyperlipidemia, unspecified: Secondary | ICD-10-CM | POA: Diagnosis not present

## 2016-09-02 DIAGNOSIS — J310 Chronic rhinitis: Secondary | ICD-10-CM | POA: Diagnosis not present

## 2016-09-02 DIAGNOSIS — R351 Nocturia: Secondary | ICD-10-CM | POA: Diagnosis not present

## 2016-10-08 DIAGNOSIS — L820 Inflamed seborrheic keratosis: Secondary | ICD-10-CM | POA: Diagnosis not present

## 2016-10-08 DIAGNOSIS — L570 Actinic keratosis: Secondary | ICD-10-CM | POA: Diagnosis not present

## 2016-10-08 DIAGNOSIS — D485 Neoplasm of uncertain behavior of skin: Secondary | ICD-10-CM | POA: Diagnosis not present

## 2016-10-08 DIAGNOSIS — L821 Other seborrheic keratosis: Secondary | ICD-10-CM | POA: Diagnosis not present

## 2016-10-08 DIAGNOSIS — Z85828 Personal history of other malignant neoplasm of skin: Secondary | ICD-10-CM | POA: Diagnosis not present

## 2016-10-08 DIAGNOSIS — Z85820 Personal history of malignant melanoma of skin: Secondary | ICD-10-CM | POA: Diagnosis not present

## 2016-12-16 DIAGNOSIS — J342 Deviated nasal septum: Secondary | ICD-10-CM | POA: Diagnosis not present

## 2016-12-16 DIAGNOSIS — H838X3 Other specified diseases of inner ear, bilateral: Secondary | ICD-10-CM | POA: Diagnosis not present

## 2016-12-16 DIAGNOSIS — J343 Hypertrophy of nasal turbinates: Secondary | ICD-10-CM | POA: Diagnosis not present

## 2016-12-16 DIAGNOSIS — H903 Sensorineural hearing loss, bilateral: Secondary | ICD-10-CM | POA: Diagnosis not present

## 2016-12-16 DIAGNOSIS — J310 Chronic rhinitis: Secondary | ICD-10-CM | POA: Diagnosis not present

## 2017-01-12 DIAGNOSIS — L821 Other seborrheic keratosis: Secondary | ICD-10-CM | POA: Diagnosis not present

## 2017-01-12 DIAGNOSIS — Z85828 Personal history of other malignant neoplasm of skin: Secondary | ICD-10-CM | POA: Diagnosis not present

## 2017-01-12 DIAGNOSIS — D485 Neoplasm of uncertain behavior of skin: Secondary | ICD-10-CM | POA: Diagnosis not present

## 2017-01-12 DIAGNOSIS — Z85820 Personal history of malignant melanoma of skin: Secondary | ICD-10-CM | POA: Diagnosis not present

## 2017-01-12 DIAGNOSIS — L570 Actinic keratosis: Secondary | ICD-10-CM | POA: Diagnosis not present

## 2017-01-21 DIAGNOSIS — Z881 Allergy status to other antibiotic agents status: Secondary | ICD-10-CM | POA: Diagnosis not present

## 2017-01-21 DIAGNOSIS — H6123 Impacted cerumen, bilateral: Secondary | ICD-10-CM | POA: Diagnosis not present

## 2017-02-08 DIAGNOSIS — H2513 Age-related nuclear cataract, bilateral: Secondary | ICD-10-CM | POA: Diagnosis not present

## 2017-02-17 ENCOUNTER — Encounter (HOSPITAL_COMMUNITY): Payer: Self-pay | Admitting: Family Medicine

## 2017-02-17 ENCOUNTER — Emergency Department (HOSPITAL_COMMUNITY)
Admission: EM | Admit: 2017-02-17 | Discharge: 2017-02-18 | Disposition: A | Discharge status: Home / Self Care | Payer: Medicare Other | Attending: Emergency Medicine | Admitting: Emergency Medicine

## 2017-02-17 DIAGNOSIS — Z23 Encounter for immunization: Secondary | ICD-10-CM | POA: Insufficient documentation

## 2017-02-17 DIAGNOSIS — S0191XA Laceration without foreign body of unspecified part of head, initial encounter: Secondary | ICD-10-CM | POA: Diagnosis not present

## 2017-02-17 DIAGNOSIS — S0990XA Unspecified injury of head, initial encounter: Secondary | ICD-10-CM | POA: Diagnosis not present

## 2017-02-17 DIAGNOSIS — W1830XA Fall on same level, unspecified, initial encounter: Secondary | ICD-10-CM | POA: Insufficient documentation

## 2017-02-17 DIAGNOSIS — Y92312 Tennis court as the place of occurrence of the external cause: Secondary | ICD-10-CM | POA: Diagnosis not present

## 2017-02-17 DIAGNOSIS — Y9373 Activity, racquet and hand sports: Secondary | ICD-10-CM | POA: Insufficient documentation

## 2017-02-17 DIAGNOSIS — T148XXA Other injury of unspecified body region, initial encounter: Secondary | ICD-10-CM

## 2017-02-17 DIAGNOSIS — Y999 Unspecified external cause status: Secondary | ICD-10-CM | POA: Diagnosis not present

## 2017-02-17 DIAGNOSIS — S0101XA Laceration without foreign body of scalp, initial encounter: Secondary | ICD-10-CM | POA: Diagnosis not present

## 2017-02-17 DIAGNOSIS — W19XXXA Unspecified fall, initial encounter: Secondary | ICD-10-CM

## 2017-02-17 DIAGNOSIS — S0001XA Abrasion of scalp, initial encounter: Secondary | ICD-10-CM | POA: Diagnosis not present

## 2017-02-17 MED ORDER — TETANUS-DIPHTH-ACELL PERTUSSIS 5-2.5-18.5 LF-MCG/0.5 IM SUSP
0.5000 mL | Freq: Once | INTRAMUSCULAR | Status: AC
  Administered 2017-02-18: 0.5 mL via INTRAMUSCULAR
  Filled 2017-02-17: qty 0.5

## 2017-02-17 NOTE — ED Triage Notes (Signed)
Patient reports he was playing tennis, slipped, and fell. Patient hit his head on the tennis court. [facility] evaluated patient and bandaged patient. Denies LOC or taking any blood thinners. Patient has a hematoma with a laceration to the vertex of his head. Bleeding controlled with pressure dressing.

## 2017-02-17 NOTE — ED Provider Notes (Signed)
[facility] DEPT Provider Note   CSN: [id number] Arrival date & time: 02/17/17  1953  By signing my name below, I, Margit Banda, attest that this documentation has been prepared under the direction and in the presence of Safir Michalec, Barbette Hair, MD. Electronically Signed: Margit Banda, ED Scribe. 02/17/17. 11:56 PM.  History   Chief Complaint Chief Complaint  Patient presents with  . Fall    HPI Adrian Ray is a 83 y.o. male with a PMHx of hard of hearing, who presents to the Emergency Department complaining of a 2/10 head injury s/p falling while playing tennis ~ 6:15 pm today, 02/17/17. He states he was playing tennis when he tripped moving backwards and hit the back/ top of his head. Associated sx include wound. Bleeding is controlled. Pt describes pain as mild and as if he got a sunburn. He is not on any blood thinners. Tetanus is NOT UTD. Pt denies fever, LOC, visual disturbance, or any other sx at this time.   The history is provided by the patient. No language interpreter was used.    History reviewed. No pertinent past medical history.  There are no active problems to display for this patient.   Past Surgical History:  Procedure Laterality Date  . APPENDECTOMY         Home Medications    Prior to Admission medications   Not on File    Family History History reviewed. No pertinent family history.  Social History Social History  Substance Use Topics  . Smoking status: Never Smoker  . Smokeless tobacco: Never Used  . Alcohol use Yes     Comment: Once a day. Beer, wine, or liquor.      Allergies   Patient has no allergy information on record.   Review of Systems Review of Systems  Constitutional: Negative for fever.  Eyes: Negative for visual disturbance.  Skin: Positive for wound.  Neurological: Negative for syncope.  All other systems reviewed and are negative.    Physical Exam Updated Vital Signs BP (!) 143/65 (BP Location: Left Arm)    Pulse (!) 56   Temp 97.7 F (36.5 C) (Oral)   Resp 18   Ht 6\' 2"  (1.88 m)   Wt 69.1 kg (152 lb 4.8 oz)   SpO2 98%   BMI 19.55 kg/m   Physical Exam  Constitutional: He is oriented to person, place, and time. He appears well-developed and well-nourished. No distress.  Appears rather than stated age, no acute distress  HENT:  Head: Normocephalic.    Large abrasion/avulsion of the scalp just posterior to the vertex, mild oozing noted, there is a laceration that is U-shaped with in the avulsion most gaping over the inferior aspects, laceration is approximately 3 cm on all sides  Eyes: Pupils are equal, round, and reactive to light. EOM are normal.  Neck: Normal range of motion. Neck supple.  No midline C-spine tenderness  Cardiovascular: Normal rate, regular rhythm and normal heart sounds.   No murmur heard. Pulmonary/Chest: Effort normal and breath sounds normal. No respiratory distress. He has no wheezes.  Abdominal: Soft. There is no tenderness.  Musculoskeletal: He exhibits no edema.  Neurological: He is alert and oriented to person, place, and time.  Skin: Skin is warm and dry.  Psychiatric: He has a normal mood and affect.  Nursing note and vitals reviewed.    ED Treatments / Results  DIAGNOSTIC STUDIES: Oxygen Saturation is 98% on RA, normal by my interpretation.   COORDINATION OF  CARE: 11:56 PM-Discussed next steps with pt which includes cleaning and closing wound. Pt will also go for a CT scan. Pt verbalized understanding and is agreeable with the plan.   Labs (all labs ordered are listed, but only abnormal results are displayed) Labs Reviewed - No data to display  EKG  EKG Interpretation None       Radiology Ct Head Wo Contrast  Result Date: 02/18/2017 CLINICAL DATA:  Slip and fall injury while playing tennis. Struck head. Hematoma and laceration to the head. No loss of consciousness. EXAM: CT HEAD WITHOUT CONTRAST TECHNIQUE: Contiguous axial images were  obtained from the base of the skull through the vertex without intravenous contrast. COMPARISON:  None. FINDINGS: Brain: Diffuse cerebral atrophy. Ventricular dilatation consistent with central atrophy. Low-attenuation changes in the deep white matter consistent with small vessel ischemia. No mass effect or midline shift. No abnormal extra-axial fluid collections. Gray-white matter junctions are distinct. Basal cisterns are not effaced. Vascular: Vascular calcifications are present. Skull: No depressed skull fractures. Sinuses/Orbits: Mucosal thickening in the paranasal sinuses. No acute air-fluid levels. Mastoid air cells are not opacified. Other: Subcutaneous scalp hematoma and laceration over the posterior vertex. IMPRESSION: No acute intracranial abnormalities. Mild chronic atrophy and small vessel ischemic changes. Electronically Signed   By: Lucienne Capers M.D.   On: 02/18/2017 00:44    Procedures Procedures (including critical care time)  LACERATION REPAIR Performed by: Merryl Hacker Authorized by: Merryl Hacker Consent: Verbal consent obtained. Risks and benefits: risks, benefits and alternatives were discussed Consent given by: patient Patient identity confirmed: provided demographic data Prepped and Draped in normal sterile fashion Wound explored  Laceration Location: scalp  Laceration Length: 3cm  No Foreign Bodies seen or palpated    Irrigation method: syringe Amount of cleaning: standard  Skin closure: staples  Number of sutures: 2  Technique: interrupted  Patient tolerance: Patient tolerated the procedure well with no immediate complications.   Medications Ordered in ED Medications  Tdap (BOOSTRIX) injection 0.5 mL (0.5 mLs Intramuscular Given 02/18/17 0059)  hydrogen peroxide 3 % external solution (  Given by Other 02/18/17 0118)  bacitracin 500 UNIT/GM ointment (  Given by Other 02/18/17 0119)     Initial Impression / Assessment and Plan / ED  Course  I have reviewed the triage vital signs and the nursing notes.  Pertinent labs & imaging results that were available during my care of the patient were reviewed by me and considered in my medical decision making (see chart for details).     Patient presents following a fall. Occurred 6 hours prior to arrival. He is nontoxic. Well appearing. He does have a significant abrasion with overlying laceration over the scalp. Given his age, CT scan was obtained to rule out intracranial bleed. This is reassuring. Wound was washed and the gaping portion of the laceration was stapled. Patient's tetanus was updated. Staple removal recommended in 10 days.  After history, exam, and medical workup I feel the patient has been appropriately medically screened and is safe for discharge home. Pertinent diagnoses were discussed with the patient. Patient was given return precautions.   Final Clinical Impressions(s) / ED Diagnoses   Final diagnoses:  Fall, initial encounter  Laceration of scalp, initial encounter  Abrasion    New Prescriptions New Prescriptions   No medications on file   I personally performed the services described in this documentation, which was scribed in my presence. The recorded information has been reviewed and is accurate.  Merryl Hacker, MD 02/18/17 [phone]

## 2017-02-18 ENCOUNTER — Emergency Department (HOSPITAL_COMMUNITY): Payer: Medicare Other

## 2017-02-18 DIAGNOSIS — S0101XA Laceration without foreign body of scalp, initial encounter: Secondary | ICD-10-CM | POA: Diagnosis not present

## 2017-02-18 DIAGNOSIS — S0990XA Unspecified injury of head, initial encounter: Secondary | ICD-10-CM | POA: Diagnosis not present

## 2017-02-18 DIAGNOSIS — S0191XA Laceration without foreign body of unspecified part of head, initial encounter: Secondary | ICD-10-CM | POA: Diagnosis not present

## 2017-02-18 MED ORDER — BACITRACIN ZINC 500 UNIT/GM EX OINT
TOPICAL_OINTMENT | CUTANEOUS | Status: AC
  Administered 2017-02-18: 01:00:00
  Filled 2017-02-18: qty 1.8

## 2017-02-18 MED ORDER — HYDROGEN PEROXIDE 3 % EX SOLN
CUTANEOUS | Status: AC
  Administered 2017-02-18: 01:00:00
  Filled 2017-02-18: qty 473

## 2017-02-18 NOTE — Discharge Instructions (Signed)
You were seen today after a fall. You had staples placed for a laceration in her scalp. You also have significant abrasion of the scalp. Keep a nonadherent dressing applied. Use triple antibiotic ointment as needed. You need to have staples removed in 10 days.

## 2017-02-19 DIAGNOSIS — S0101XA Laceration without foreign body of scalp, initial encounter: Secondary | ICD-10-CM | POA: Diagnosis not present

## 2017-02-24 DIAGNOSIS — Z4802 Encounter for removal of sutures: Secondary | ICD-10-CM | POA: Diagnosis not present

## 2017-04-15 DIAGNOSIS — D485 Neoplasm of uncertain behavior of skin: Secondary | ICD-10-CM | POA: Diagnosis not present

## 2017-04-15 DIAGNOSIS — L441 Lichen nitidus: Secondary | ICD-10-CM | POA: Diagnosis not present

## 2017-04-15 DIAGNOSIS — D0471 Carcinoma in situ of skin of right lower limb, including hip: Secondary | ICD-10-CM | POA: Diagnosis not present

## 2017-04-15 DIAGNOSIS — Z85828 Personal history of other malignant neoplasm of skin: Secondary | ICD-10-CM | POA: Diagnosis not present

## 2017-04-15 DIAGNOSIS — C44311 Basal cell carcinoma of skin of nose: Secondary | ICD-10-CM | POA: Diagnosis not present

## 2017-04-15 DIAGNOSIS — L821 Other seborrheic keratosis: Secondary | ICD-10-CM | POA: Diagnosis not present

## 2017-04-15 DIAGNOSIS — L570 Actinic keratosis: Secondary | ICD-10-CM | POA: Diagnosis not present

## 2017-04-15 DIAGNOSIS — Z85820 Personal history of malignant melanoma of skin: Secondary | ICD-10-CM | POA: Diagnosis not present

## 2017-04-15 DIAGNOSIS — L814 Other melanin hyperpigmentation: Secondary | ICD-10-CM | POA: Diagnosis not present

## 2017-06-11 DIAGNOSIS — H838X3 Other specified diseases of inner ear, bilateral: Secondary | ICD-10-CM | POA: Diagnosis not present

## 2017-06-11 DIAGNOSIS — H903 Sensorineural hearing loss, bilateral: Secondary | ICD-10-CM | POA: Diagnosis not present

## 2017-07-01 DIAGNOSIS — Z85828 Personal history of other malignant neoplasm of skin: Secondary | ICD-10-CM | POA: Diagnosis not present

## 2017-07-01 DIAGNOSIS — Z85820 Personal history of malignant melanoma of skin: Secondary | ICD-10-CM | POA: Diagnosis not present

## 2017-07-01 DIAGNOSIS — D485 Neoplasm of uncertain behavior of skin: Secondary | ICD-10-CM | POA: Diagnosis not present

## 2017-07-01 DIAGNOSIS — C44729 Squamous cell carcinoma of skin of left lower limb, including hip: Secondary | ICD-10-CM | POA: Diagnosis not present

## 2017-07-01 DIAGNOSIS — L821 Other seborrheic keratosis: Secondary | ICD-10-CM | POA: Diagnosis not present

## 2017-07-01 DIAGNOSIS — L570 Actinic keratosis: Secondary | ICD-10-CM | POA: Diagnosis not present

## 2017-08-13 DIAGNOSIS — H903 Sensorineural hearing loss, bilateral: Secondary | ICD-10-CM | POA: Diagnosis not present

## 2017-09-01 DIAGNOSIS — H903 Sensorineural hearing loss, bilateral: Secondary | ICD-10-CM | POA: Diagnosis not present

## 2017-09-01 DIAGNOSIS — J342 Deviated nasal septum: Secondary | ICD-10-CM | POA: Diagnosis not present

## 2017-09-01 DIAGNOSIS — J310 Chronic rhinitis: Secondary | ICD-10-CM | POA: Diagnosis not present

## 2017-09-01 DIAGNOSIS — H6983 Other specified disorders of Eustachian tube, bilateral: Secondary | ICD-10-CM | POA: Diagnosis not present

## 2017-09-08 DIAGNOSIS — J310 Chronic rhinitis: Secondary | ICD-10-CM | POA: Diagnosis not present

## 2017-09-08 DIAGNOSIS — Z0000 Encounter for general adult medical examination without abnormal findings: Secondary | ICD-10-CM | POA: Diagnosis not present

## 2017-09-08 DIAGNOSIS — M179 Osteoarthritis of knee, unspecified: Secondary | ICD-10-CM | POA: Diagnosis not present

## 2017-09-08 DIAGNOSIS — R351 Nocturia: Secondary | ICD-10-CM | POA: Diagnosis not present

## 2017-09-08 DIAGNOSIS — K5730 Diverticulosis of large intestine without perforation or abscess without bleeding: Secondary | ICD-10-CM | POA: Diagnosis not present

## 2017-09-08 DIAGNOSIS — Z1389 Encounter for screening for other disorder: Secondary | ICD-10-CM | POA: Diagnosis not present

## 2017-09-08 DIAGNOSIS — E785 Hyperlipidemia, unspecified: Secondary | ICD-10-CM | POA: Diagnosis not present

## 2017-09-08 DIAGNOSIS — H903 Sensorineural hearing loss, bilateral: Secondary | ICD-10-CM | POA: Diagnosis not present

## 2017-09-30 DIAGNOSIS — L570 Actinic keratosis: Secondary | ICD-10-CM | POA: Diagnosis not present

## 2017-09-30 DIAGNOSIS — L821 Other seborrheic keratosis: Secondary | ICD-10-CM | POA: Diagnosis not present

## 2017-09-30 DIAGNOSIS — Z85820 Personal history of malignant melanoma of skin: Secondary | ICD-10-CM | POA: Diagnosis not present

## 2017-09-30 DIAGNOSIS — L814 Other melanin hyperpigmentation: Secondary | ICD-10-CM | POA: Diagnosis not present

## 2017-09-30 DIAGNOSIS — Z85828 Personal history of other malignant neoplasm of skin: Secondary | ICD-10-CM | POA: Diagnosis not present

## 2017-11-23 DIAGNOSIS — E7800 Pure hypercholesterolemia, unspecified: Secondary | ICD-10-CM | POA: Diagnosis not present

## 2017-12-30 DIAGNOSIS — Z85828 Personal history of other malignant neoplasm of skin: Secondary | ICD-10-CM | POA: Diagnosis not present

## 2017-12-30 DIAGNOSIS — L821 Other seborrheic keratosis: Secondary | ICD-10-CM | POA: Diagnosis not present

## 2017-12-30 DIAGNOSIS — Z85820 Personal history of malignant melanoma of skin: Secondary | ICD-10-CM | POA: Diagnosis not present

## 2017-12-30 DIAGNOSIS — D1801 Hemangioma of skin and subcutaneous tissue: Secondary | ICD-10-CM | POA: Diagnosis not present

## 2017-12-30 DIAGNOSIS — L570 Actinic keratosis: Secondary | ICD-10-CM | POA: Diagnosis not present

## 2018-02-10 DIAGNOSIS — H25013 Cortical age-related cataract, bilateral: Secondary | ICD-10-CM | POA: Diagnosis not present

## 2018-02-10 DIAGNOSIS — H2513 Age-related nuclear cataract, bilateral: Secondary | ICD-10-CM | POA: Diagnosis not present

## 2018-02-25 DIAGNOSIS — C44729 Squamous cell carcinoma of skin of left lower limb, including hip: Secondary | ICD-10-CM | POA: Diagnosis not present

## 2018-02-25 DIAGNOSIS — D485 Neoplasm of uncertain behavior of skin: Secondary | ICD-10-CM | POA: Diagnosis not present

## 2018-02-25 DIAGNOSIS — L821 Other seborrheic keratosis: Secondary | ICD-10-CM | POA: Diagnosis not present

## 2018-02-25 DIAGNOSIS — Z85828 Personal history of other malignant neoplasm of skin: Secondary | ICD-10-CM | POA: Diagnosis not present

## 2018-02-25 DIAGNOSIS — Z85820 Personal history of malignant melanoma of skin: Secondary | ICD-10-CM | POA: Diagnosis not present

## 2018-03-01 DIAGNOSIS — H838X3 Other specified diseases of inner ear, bilateral: Secondary | ICD-10-CM | POA: Diagnosis not present

## 2018-03-01 DIAGNOSIS — H903 Sensorineural hearing loss, bilateral: Secondary | ICD-10-CM | POA: Diagnosis not present

## 2018-03-01 DIAGNOSIS — J310 Chronic rhinitis: Secondary | ICD-10-CM | POA: Diagnosis not present

## 2018-03-01 DIAGNOSIS — J343 Hypertrophy of nasal turbinates: Secondary | ICD-10-CM | POA: Diagnosis not present

## 2018-03-01 DIAGNOSIS — J342 Deviated nasal septum: Secondary | ICD-10-CM | POA: Diagnosis not present

## 2018-03-14 DIAGNOSIS — H9193 Unspecified hearing loss, bilateral: Secondary | ICD-10-CM | POA: Diagnosis not present

## 2018-03-14 DIAGNOSIS — H6983 Other specified disorders of Eustachian tube, bilateral: Secondary | ICD-10-CM | POA: Diagnosis not present

## 2018-03-14 DIAGNOSIS — H9313 Tinnitus, bilateral: Secondary | ICD-10-CM | POA: Diagnosis not present

## 2018-04-04 DIAGNOSIS — L218 Other seborrheic dermatitis: Secondary | ICD-10-CM | POA: Diagnosis not present

## 2018-04-04 DIAGNOSIS — Z85828 Personal history of other malignant neoplasm of skin: Secondary | ICD-10-CM | POA: Diagnosis not present

## 2018-04-04 DIAGNOSIS — L821 Other seborrheic keratosis: Secondary | ICD-10-CM | POA: Diagnosis not present

## 2018-04-04 DIAGNOSIS — Z85820 Personal history of malignant melanoma of skin: Secondary | ICD-10-CM | POA: Diagnosis not present

## 2018-04-04 DIAGNOSIS — L570 Actinic keratosis: Secondary | ICD-10-CM | POA: Diagnosis not present

## 2018-05-23 DIAGNOSIS — L84 Corns and callosities: Secondary | ICD-10-CM | POA: Diagnosis not present

## 2018-05-23 DIAGNOSIS — E785 Hyperlipidemia, unspecified: Secondary | ICD-10-CM | POA: Diagnosis not present

## 2018-05-23 DIAGNOSIS — G4700 Insomnia, unspecified: Secondary | ICD-10-CM | POA: Diagnosis not present

## 2018-05-23 DIAGNOSIS — H903 Sensorineural hearing loss, bilateral: Secondary | ICD-10-CM | POA: Diagnosis not present

## 2018-05-30 ENCOUNTER — Ambulatory Visit: Payer: Self-pay | Admitting: Podiatry

## 2018-07-04 DIAGNOSIS — L570 Actinic keratosis: Secondary | ICD-10-CM | POA: Diagnosis not present

## 2018-07-04 DIAGNOSIS — Z85828 Personal history of other malignant neoplasm of skin: Secondary | ICD-10-CM | POA: Diagnosis not present

## 2018-07-04 DIAGNOSIS — L814 Other melanin hyperpigmentation: Secondary | ICD-10-CM | POA: Diagnosis not present

## 2018-07-04 DIAGNOSIS — Z85820 Personal history of malignant melanoma of skin: Secondary | ICD-10-CM | POA: Diagnosis not present

## 2018-07-04 DIAGNOSIS — L821 Other seborrheic keratosis: Secondary | ICD-10-CM | POA: Diagnosis not present

## 2018-08-23 DIAGNOSIS — H903 Sensorineural hearing loss, bilateral: Secondary | ICD-10-CM | POA: Diagnosis not present

## 2018-08-23 DIAGNOSIS — J310 Chronic rhinitis: Secondary | ICD-10-CM | POA: Diagnosis not present

## 2018-08-23 DIAGNOSIS — J343 Hypertrophy of nasal turbinates: Secondary | ICD-10-CM | POA: Diagnosis not present

## 2018-08-23 DIAGNOSIS — J342 Deviated nasal septum: Secondary | ICD-10-CM | POA: Diagnosis not present

## 2018-09-29 DIAGNOSIS — E785 Hyperlipidemia, unspecified: Secondary | ICD-10-CM | POA: Diagnosis not present

## 2018-09-29 DIAGNOSIS — R351 Nocturia: Secondary | ICD-10-CM | POA: Diagnosis not present

## 2018-09-29 DIAGNOSIS — J310 Chronic rhinitis: Secondary | ICD-10-CM | POA: Diagnosis not present

## 2018-09-29 DIAGNOSIS — Z0000 Encounter for general adult medical examination without abnormal findings: Secondary | ICD-10-CM | POA: Diagnosis not present

## 2018-09-29 DIAGNOSIS — H903 Sensorineural hearing loss, bilateral: Secondary | ICD-10-CM | POA: Diagnosis not present

## 2018-09-29 DIAGNOSIS — G4700 Insomnia, unspecified: Secondary | ICD-10-CM | POA: Diagnosis not present

## 2018-09-29 DIAGNOSIS — K5730 Diverticulosis of large intestine without perforation or abscess without bleeding: Secondary | ICD-10-CM | POA: Diagnosis not present

## 2018-09-29 DIAGNOSIS — M179 Osteoarthritis of knee, unspecified: Secondary | ICD-10-CM | POA: Diagnosis not present

## 2018-09-29 DIAGNOSIS — Z1389 Encounter for screening for other disorder: Secondary | ICD-10-CM | POA: Diagnosis not present

## 2018-10-17 DIAGNOSIS — Z85828 Personal history of other malignant neoplasm of skin: Secondary | ICD-10-CM | POA: Diagnosis not present

## 2018-10-17 DIAGNOSIS — L821 Other seborrheic keratosis: Secondary | ICD-10-CM | POA: Diagnosis not present

## 2018-10-17 DIAGNOSIS — Z85820 Personal history of malignant melanoma of skin: Secondary | ICD-10-CM | POA: Diagnosis not present

## 2018-10-17 DIAGNOSIS — L570 Actinic keratosis: Secondary | ICD-10-CM | POA: Diagnosis not present

## 2018-10-17 DIAGNOSIS — D1801 Hemangioma of skin and subcutaneous tissue: Secondary | ICD-10-CM | POA: Diagnosis not present

## 2019-01-03 DIAGNOSIS — S3991XA Unspecified injury of abdomen, initial encounter: Secondary | ICD-10-CM | POA: Diagnosis not present

## 2019-01-17 DIAGNOSIS — L570 Actinic keratosis: Secondary | ICD-10-CM | POA: Diagnosis not present

## 2019-01-17 DIAGNOSIS — Z85820 Personal history of malignant melanoma of skin: Secondary | ICD-10-CM | POA: Diagnosis not present

## 2019-01-17 DIAGNOSIS — L821 Other seborrheic keratosis: Secondary | ICD-10-CM | POA: Diagnosis not present

## 2019-01-17 DIAGNOSIS — Z85828 Personal history of other malignant neoplasm of skin: Secondary | ICD-10-CM | POA: Diagnosis not present

## 2019-01-17 DIAGNOSIS — D485 Neoplasm of uncertain behavior of skin: Secondary | ICD-10-CM | POA: Diagnosis not present

## 2019-01-17 DIAGNOSIS — L820 Inflamed seborrheic keratosis: Secondary | ICD-10-CM | POA: Diagnosis not present

## 2019-02-13 DIAGNOSIS — H2513 Age-related nuclear cataract, bilateral: Secondary | ICD-10-CM | POA: Diagnosis not present

## 2019-02-28 DIAGNOSIS — K5641 Fecal impaction: Secondary | ICD-10-CM | POA: Diagnosis not present

## 2019-03-16 DIAGNOSIS — K5730 Diverticulosis of large intestine without perforation or abscess without bleeding: Secondary | ICD-10-CM | POA: Diagnosis not present

## 2019-03-16 DIAGNOSIS — H903 Sensorineural hearing loss, bilateral: Secondary | ICD-10-CM | POA: Diagnosis not present

## 2019-03-16 DIAGNOSIS — E785 Hyperlipidemia, unspecified: Secondary | ICD-10-CM | POA: Diagnosis not present

## 2019-03-16 DIAGNOSIS — M179 Osteoarthritis of knee, unspecified: Secondary | ICD-10-CM | POA: Diagnosis not present

## 2019-03-16 DIAGNOSIS — K5641 Fecal impaction: Secondary | ICD-10-CM | POA: Diagnosis not present

## 2019-03-16 DIAGNOSIS — J310 Chronic rhinitis: Secondary | ICD-10-CM | POA: Diagnosis not present

## 2019-03-16 DIAGNOSIS — E538 Deficiency of other specified B group vitamins: Secondary | ICD-10-CM | POA: Diagnosis not present

## 2019-03-16 DIAGNOSIS — G4700 Insomnia, unspecified: Secondary | ICD-10-CM | POA: Diagnosis not present

## 2019-03-16 DIAGNOSIS — R351 Nocturia: Secondary | ICD-10-CM | POA: Diagnosis not present

## 2019-04-20 DIAGNOSIS — Z85820 Personal history of malignant melanoma of skin: Secondary | ICD-10-CM | POA: Diagnosis not present

## 2019-04-20 DIAGNOSIS — L821 Other seborrheic keratosis: Secondary | ICD-10-CM | POA: Diagnosis not present

## 2019-04-20 DIAGNOSIS — L570 Actinic keratosis: Secondary | ICD-10-CM | POA: Diagnosis not present

## 2019-04-20 DIAGNOSIS — Z85828 Personal history of other malignant neoplasm of skin: Secondary | ICD-10-CM | POA: Diagnosis not present

## 2019-04-20 DIAGNOSIS — C44729 Squamous cell carcinoma of skin of left lower limb, including hip: Secondary | ICD-10-CM | POA: Diagnosis not present

## 2019-04-20 DIAGNOSIS — D485 Neoplasm of uncertain behavior of skin: Secondary | ICD-10-CM | POA: Diagnosis not present

## 2019-05-08 DIAGNOSIS — M545 Low back pain: Secondary | ICD-10-CM | POA: Diagnosis not present

## 2019-05-11 DIAGNOSIS — Z23 Encounter for immunization: Secondary | ICD-10-CM | POA: Diagnosis not present

## 2019-05-16 DIAGNOSIS — M47896 Other spondylosis, lumbar region: Secondary | ICD-10-CM | POA: Diagnosis not present

## 2019-05-22 DIAGNOSIS — M47896 Other spondylosis, lumbar region: Secondary | ICD-10-CM | POA: Diagnosis not present

## 2019-05-26 DIAGNOSIS — M47896 Other spondylosis, lumbar region: Secondary | ICD-10-CM | POA: Diagnosis not present

## 2019-05-30 DIAGNOSIS — M47896 Other spondylosis, lumbar region: Secondary | ICD-10-CM | POA: Diagnosis not present

## 2019-06-06 DIAGNOSIS — M47896 Other spondylosis, lumbar region: Secondary | ICD-10-CM | POA: Diagnosis not present

## 2019-06-12 DIAGNOSIS — J310 Chronic rhinitis: Secondary | ICD-10-CM | POA: Diagnosis not present

## 2019-06-12 DIAGNOSIS — M545 Low back pain: Secondary | ICD-10-CM | POA: Diagnosis not present

## 2019-06-12 DIAGNOSIS — K5641 Fecal impaction: Secondary | ICD-10-CM | POA: Diagnosis not present

## 2019-06-12 DIAGNOSIS — H903 Sensorineural hearing loss, bilateral: Secondary | ICD-10-CM | POA: Diagnosis not present

## 2019-08-08 DIAGNOSIS — Z23 Encounter for immunization: Secondary | ICD-10-CM | POA: Diagnosis not present

## 2019-08-09 DIAGNOSIS — Z20828 Contact with and (suspected) exposure to other viral communicable diseases: Secondary | ICD-10-CM | POA: Diagnosis not present

## 2019-09-06 DIAGNOSIS — Z23 Encounter for immunization: Secondary | ICD-10-CM | POA: Diagnosis not present

## 2019-10-09 DIAGNOSIS — H903 Sensorineural hearing loss, bilateral: Secondary | ICD-10-CM | POA: Diagnosis not present

## 2019-10-09 DIAGNOSIS — E785 Hyperlipidemia, unspecified: Secondary | ICD-10-CM | POA: Diagnosis not present

## 2019-10-09 DIAGNOSIS — M545 Low back pain: Secondary | ICD-10-CM | POA: Diagnosis not present

## 2019-10-09 DIAGNOSIS — E538 Deficiency of other specified B group vitamins: Secondary | ICD-10-CM | POA: Diagnosis not present

## 2019-10-09 DIAGNOSIS — G4700 Insomnia, unspecified: Secondary | ICD-10-CM | POA: Diagnosis not present

## 2019-10-09 DIAGNOSIS — Z0001 Encounter for general adult medical examination with abnormal findings: Secondary | ICD-10-CM | POA: Diagnosis not present

## 2019-10-09 DIAGNOSIS — J310 Chronic rhinitis: Secondary | ICD-10-CM | POA: Diagnosis not present

## 2019-10-09 DIAGNOSIS — R351 Nocturia: Secondary | ICD-10-CM | POA: Diagnosis not present

## 2019-10-09 DIAGNOSIS — M179 Osteoarthritis of knee, unspecified: Secondary | ICD-10-CM | POA: Diagnosis not present

## 2019-10-09 DIAGNOSIS — K5730 Diverticulosis of large intestine without perforation or abscess without bleeding: Secondary | ICD-10-CM | POA: Diagnosis not present

## 2019-10-12 DIAGNOSIS — L821 Other seborrheic keratosis: Secondary | ICD-10-CM | POA: Diagnosis not present

## 2019-10-12 DIAGNOSIS — Z85828 Personal history of other malignant neoplasm of skin: Secondary | ICD-10-CM | POA: Diagnosis not present

## 2019-10-12 DIAGNOSIS — D485 Neoplasm of uncertain behavior of skin: Secondary | ICD-10-CM | POA: Diagnosis not present

## 2019-10-12 DIAGNOSIS — D225 Melanocytic nevi of trunk: Secondary | ICD-10-CM | POA: Diagnosis not present

## 2019-10-12 DIAGNOSIS — L570 Actinic keratosis: Secondary | ICD-10-CM | POA: Diagnosis not present

## 2019-10-12 DIAGNOSIS — C44629 Squamous cell carcinoma of skin of left upper limb, including shoulder: Secondary | ICD-10-CM | POA: Diagnosis not present

## 2019-10-12 DIAGNOSIS — Z85820 Personal history of malignant melanoma of skin: Secondary | ICD-10-CM | POA: Diagnosis not present

## 2019-10-18 DIAGNOSIS — H903 Sensorineural hearing loss, bilateral: Secondary | ICD-10-CM | POA: Diagnosis not present

## 2020-02-13 DIAGNOSIS — H2513 Age-related nuclear cataract, bilateral: Secondary | ICD-10-CM | POA: Diagnosis not present

## 2020-02-29 DIAGNOSIS — C44729 Squamous cell carcinoma of skin of left lower limb, including hip: Secondary | ICD-10-CM | POA: Diagnosis not present

## 2020-02-29 DIAGNOSIS — Z85828 Personal history of other malignant neoplasm of skin: Secondary | ICD-10-CM | POA: Diagnosis not present

## 2020-02-29 DIAGNOSIS — D485 Neoplasm of uncertain behavior of skin: Secondary | ICD-10-CM | POA: Diagnosis not present

## 2020-02-29 DIAGNOSIS — Z85820 Personal history of malignant melanoma of skin: Secondary | ICD-10-CM | POA: Diagnosis not present

## 2020-02-29 DIAGNOSIS — L570 Actinic keratosis: Secondary | ICD-10-CM | POA: Diagnosis not present

## 2020-04-08 DIAGNOSIS — H903 Sensorineural hearing loss, bilateral: Secondary | ICD-10-CM | POA: Diagnosis not present

## 2020-04-08 DIAGNOSIS — K5730 Diverticulosis of large intestine without perforation or abscess without bleeding: Secondary | ICD-10-CM | POA: Diagnosis not present

## 2020-04-08 DIAGNOSIS — Z23 Encounter for immunization: Secondary | ICD-10-CM | POA: Diagnosis not present

## 2020-04-08 DIAGNOSIS — G4700 Insomnia, unspecified: Secondary | ICD-10-CM | POA: Diagnosis not present

## 2020-04-08 DIAGNOSIS — E538 Deficiency of other specified B group vitamins: Secondary | ICD-10-CM | POA: Diagnosis not present

## 2020-04-08 DIAGNOSIS — Z0000 Encounter for general adult medical examination without abnormal findings: Secondary | ICD-10-CM | POA: Diagnosis not present

## 2020-04-08 DIAGNOSIS — M179 Osteoarthritis of knee, unspecified: Secondary | ICD-10-CM | POA: Diagnosis not present

## 2020-04-08 DIAGNOSIS — E785 Hyperlipidemia, unspecified: Secondary | ICD-10-CM | POA: Diagnosis not present

## 2020-04-08 DIAGNOSIS — J310 Chronic rhinitis: Secondary | ICD-10-CM | POA: Diagnosis not present

## 2020-04-08 DIAGNOSIS — M545 Low back pain: Secondary | ICD-10-CM | POA: Diagnosis not present

## 2020-04-08 DIAGNOSIS — Z1389 Encounter for screening for other disorder: Secondary | ICD-10-CM | POA: Diagnosis not present

## 2020-04-08 DIAGNOSIS — R351 Nocturia: Secondary | ICD-10-CM | POA: Diagnosis not present

## 2020-04-17 DIAGNOSIS — Z85820 Personal history of malignant melanoma of skin: Secondary | ICD-10-CM | POA: Diagnosis not present

## 2020-04-17 DIAGNOSIS — D485 Neoplasm of uncertain behavior of skin: Secondary | ICD-10-CM | POA: Diagnosis not present

## 2020-04-17 DIAGNOSIS — C44722 Squamous cell carcinoma of skin of right lower limb, including hip: Secondary | ICD-10-CM | POA: Diagnosis not present

## 2020-04-17 DIAGNOSIS — L570 Actinic keratosis: Secondary | ICD-10-CM | POA: Diagnosis not present

## 2020-04-17 DIAGNOSIS — Z85828 Personal history of other malignant neoplasm of skin: Secondary | ICD-10-CM | POA: Diagnosis not present

## 2020-04-17 DIAGNOSIS — L821 Other seborrheic keratosis: Secondary | ICD-10-CM | POA: Diagnosis not present

## 2020-06-11 DIAGNOSIS — Z23 Encounter for immunization: Secondary | ICD-10-CM | POA: Diagnosis not present

## 2020-07-17 DIAGNOSIS — D485 Neoplasm of uncertain behavior of skin: Secondary | ICD-10-CM | POA: Diagnosis not present

## 2020-07-17 DIAGNOSIS — Z85820 Personal history of malignant melanoma of skin: Secondary | ICD-10-CM | POA: Diagnosis not present

## 2020-07-17 DIAGNOSIS — Z85828 Personal history of other malignant neoplasm of skin: Secondary | ICD-10-CM | POA: Diagnosis not present

## 2020-07-17 DIAGNOSIS — L570 Actinic keratosis: Secondary | ICD-10-CM | POA: Diagnosis not present

## 2020-07-17 DIAGNOSIS — C44722 Squamous cell carcinoma of skin of right lower limb, including hip: Secondary | ICD-10-CM | POA: Diagnosis not present

## 2020-07-17 DIAGNOSIS — L814 Other melanin hyperpigmentation: Secondary | ICD-10-CM | POA: Diagnosis not present

## 2020-07-17 DIAGNOSIS — L821 Other seborrheic keratosis: Secondary | ICD-10-CM | POA: Diagnosis not present

## 2020-07-25 DIAGNOSIS — H906 Mixed conductive and sensorineural hearing loss, bilateral: Secondary | ICD-10-CM | POA: Diagnosis not present

## 2020-07-25 DIAGNOSIS — Z77122 Contact with and (suspected) exposure to noise: Secondary | ICD-10-CM | POA: Diagnosis not present

## 2020-07-25 DIAGNOSIS — H9313 Tinnitus, bilateral: Secondary | ICD-10-CM | POA: Diagnosis not present

## 2020-09-24 DIAGNOSIS — C4442 Squamous cell carcinoma of skin of scalp and neck: Secondary | ICD-10-CM | POA: Diagnosis not present

## 2020-09-24 DIAGNOSIS — Z85828 Personal history of other malignant neoplasm of skin: Secondary | ICD-10-CM | POA: Diagnosis not present

## 2020-09-24 DIAGNOSIS — Z85820 Personal history of malignant melanoma of skin: Secondary | ICD-10-CM | POA: Diagnosis not present

## 2020-09-24 DIAGNOSIS — D485 Neoplasm of uncertain behavior of skin: Secondary | ICD-10-CM | POA: Diagnosis not present

## 2020-10-02 ENCOUNTER — Emergency Department (HOSPITAL_COMMUNITY): Payer: Medicare Other

## 2020-10-02 ENCOUNTER — Emergency Department (HOSPITAL_COMMUNITY)
Admission: EM | Admit: 2020-10-02 | Discharge: 2020-10-02 | Disposition: A | Discharge status: Home / Self Care | Payer: Medicare Other | Attending: Emergency Medicine | Admitting: Emergency Medicine

## 2020-10-02 ENCOUNTER — Other Ambulatory Visit: Payer: Self-pay

## 2020-10-02 ENCOUNTER — Encounter (HOSPITAL_COMMUNITY): Payer: Self-pay | Admitting: *Deleted

## 2020-10-02 DIAGNOSIS — Z23 Encounter for immunization: Secondary | ICD-10-CM | POA: Diagnosis not present

## 2020-10-02 DIAGNOSIS — S0181XA Laceration without foreign body of other part of head, initial encounter: Secondary | ICD-10-CM | POA: Diagnosis not present

## 2020-10-02 DIAGNOSIS — S81011A Laceration without foreign body, right knee, initial encounter: Secondary | ICD-10-CM | POA: Insufficient documentation

## 2020-10-02 DIAGNOSIS — S0001XA Abrasion of scalp, initial encounter: Secondary | ICD-10-CM | POA: Diagnosis not present

## 2020-10-02 DIAGNOSIS — I10 Essential (primary) hypertension: Secondary | ICD-10-CM | POA: Diagnosis not present

## 2020-10-02 DIAGNOSIS — S0083XA Contusion of other part of head, initial encounter: Secondary | ICD-10-CM | POA: Diagnosis not present

## 2020-10-02 DIAGNOSIS — S41122A Laceration with foreign body of left upper arm, initial encounter: Secondary | ICD-10-CM | POA: Insufficient documentation

## 2020-10-02 DIAGNOSIS — S81012A Laceration without foreign body, left knee, initial encounter: Secondary | ICD-10-CM | POA: Insufficient documentation

## 2020-10-02 DIAGNOSIS — S41111A Laceration without foreign body of right upper arm, initial encounter: Secondary | ICD-10-CM | POA: Insufficient documentation

## 2020-10-02 DIAGNOSIS — S0512XA Contusion of eyeball and orbital tissues, left eye, initial encounter: Secondary | ICD-10-CM | POA: Diagnosis not present

## 2020-10-02 DIAGNOSIS — Z043 Encounter for examination and observation following other accident: Secondary | ICD-10-CM | POA: Diagnosis not present

## 2020-10-02 DIAGNOSIS — W01198A Fall on same level from slipping, tripping and stumbling with subsequent striking against other object, initial encounter: Secondary | ICD-10-CM | POA: Diagnosis not present

## 2020-10-02 DIAGNOSIS — S0993XA Unspecified injury of face, initial encounter: Secondary | ICD-10-CM | POA: Diagnosis present

## 2020-10-02 DIAGNOSIS — I959 Hypotension, unspecified: Secondary | ICD-10-CM | POA: Diagnosis not present

## 2020-10-02 DIAGNOSIS — Y92481 Parking lot as the place of occurrence of the external cause: Secondary | ICD-10-CM | POA: Diagnosis not present

## 2020-10-02 DIAGNOSIS — W19XXXA Unspecified fall, initial encounter: Secondary | ICD-10-CM

## 2020-10-02 DIAGNOSIS — S01112A Laceration without foreign body of left eyelid and periocular area, initial encounter: Secondary | ICD-10-CM | POA: Diagnosis not present

## 2020-10-02 DIAGNOSIS — R58 Hemorrhage, not elsewhere classified: Secondary | ICD-10-CM | POA: Diagnosis not present

## 2020-10-02 HISTORY — DX: Hyperlipidemia, unspecified: E78.5

## 2020-10-02 HISTORY — DX: Unspecified cataract: H26.9

## 2020-10-02 HISTORY — DX: Gastro-esophageal reflux disease without esophagitis: K21.9

## 2020-10-02 MED ORDER — CEPHALEXIN 500 MG PO CAPS: 500.0000 mg | ORAL_CAPSULE | Freq: Four times a day (QID) | ORAL | 0 refills | Status: AC

## 2020-10-02 MED ORDER — TETANUS-DIPHTH-ACELL PERTUSSIS 5-2.5-18.5 LF-MCG/0.5 IM SUSY
0.5000 mL | PREFILLED_SYRINGE | Freq: Once | INTRAMUSCULAR | Status: AC
  Administered 2020-10-02: 0.5 mL via INTRAMUSCULAR
  Filled 2020-10-02: qty 0.5

## 2020-10-02 MED ORDER — ACETAMINOPHEN 325 MG PO TABS
650.0000 mg | ORAL_TABLET | Freq: Once | ORAL | Status: AC
  Administered 2020-10-02: 650 mg via ORAL
  Filled 2020-10-02: qty 2

## 2020-10-02 MED ORDER — LIDOCAINE-EPINEPHRINE (PF) 2 %-1:200000 IJ SOLN
20.0000 mL | Freq: Once | INTRAMUSCULAR | Status: AC
  Administered 2020-10-02: 20 mL via INTRADERMAL
  Filled 2020-10-02: qty 20

## 2020-10-02 NOTE — ED Provider Notes (Signed)
[facility] DEPT Provider Note   CSN: [id number] Arrival date & time: 10/02/20  1747     History Chief Complaint  Patient presents with  . Laceration  . Fall    Adrian Ray is a 87 y.o. male.  HPI   87 year old male with a history of cataracts, GERD, hyperlipidemia, presents the emergency department today for evaluation of a fall.  Patient was at [place] prior to arrival when he tripped over something and fell forward hitting his face and bilateral knees.  He has laceration over the left eyebrow and has some ecchymosis around the left eye.  He denies any significant pain to the face and denies any LOC.  He denies any neck or back pain.  He is complaining of pain to the left ribs and he has some mild pain to the left knee.  He is not sure when his last Tdap was  Past Medical History:  Diagnosis Date  . Cataracts, bilateral   . GERD (gastroesophageal reflux disease)   . Hyperlipemia     There are no problems to display for this patient.   Past Surgical History:  Procedure Laterality Date  . APPENDECTOMY         No family history on file.  Social History   Tobacco Use  . Smoking status: Never Smoker  . Smokeless tobacco: Never Used  Vaping Use  . Vaping Use: Never used  Substance Use Topics  . Alcohol use: Yes    Comment: Once a day. Beer, wine, or liquor.   . Drug use: No    Home Medications Prior to Admission medications   Medication Sig Start Date End Date Taking? Authorizing Provider  atorvastatin (LIPITOR) 10 MG tablet Take 10 mg by mouth See admin instructions. Takes 1 tablet on Monday, Wednesday and Friday 02/10/18  Yes [provider]  cephALEXin (KEFLEX) 500 MG capsule Take 1 capsule (500 mg total) by mouth 4 (four) times daily for 7 days. 10/02/20 10/09/20 Yes Couture, Cortni S, PA-C  fluticasone (FLONASE) 50 MCG/ACT nasal spray Place 1 spray into both nostrils daily as needed for allergies. 01/16/16  Yes [provider]    Allergies    Tetracycline and Codeine  Review of Systems   Review of Systems  Constitutional: Negative for fever.  HENT: Negative for ear pain and sore throat.   Eyes: Negative for visual disturbance.  Respiratory: Negative for cough and shortness of breath.   Cardiovascular: Negative for chest pain.  Gastrointestinal: Negative for abdominal pain, constipation, diarrhea, nausea and vomiting.  Genitourinary: Negative for dysuria and hematuria.  Musculoskeletal: Negative for back pain and neck pain.       Left knee pain  Skin: Positive for wound.  Neurological:       Head injury, no loc  All other systems reviewed and are negative.   Physical Exam Updated Vital Signs BP (!) 148/70   Pulse 73   Temp 98 F (36.7 C) (Oral)   Resp 18   Ht 6\' 2"  (1.88 m)   Wt 83.9 kg   SpO2 97%   BMI 23.75 kg/m   Physical Exam Vitals and nursing note reviewed.  Constitutional:      Appearance: He is well-developed and well-nourished.  HENT:     Head: Normocephalic.     Comments: 3cm laceration above the left eyebrow, 1 cm laceration above this. Ecchymosis noted around the left eye.     Nose: Nose normal.  Comments: No ttp Eyes:     Extraocular Movements: Extraocular movements intact.     Conjunctiva/sclera: Conjunctivae normal.     Pupils: Pupils are equal, round, and reactive to light.     Comments: No entrapment  Cardiovascular:     Rate and Rhythm: Normal rate and regular rhythm.     Heart sounds: Normal heart sounds. No murmur heard.   Pulmonary:     Effort: Pulmonary effort is normal. No respiratory distress.     Breath sounds: Normal breath sounds. No wheezing, rhonchi or rales.  Chest:     Chest wall: Tenderness (left chest ttp) present.  Abdominal:     General: Bowel sounds are normal.     Palpations: Abdomen is soft.     Tenderness: There is no abdominal tenderness. There is no guarding or rebound.  Musculoskeletal:        General: No edema.      Cervical back: Neck supple.     Comments: No cspine ttp, skins tears noted to the bilat arms and bilat knees. No pain with ROM of the bilat knees or hips. 1cm laceration over the right knee. Does not appear to involve the joint.  Skin:    General: Skin is warm and dry.  Neurological:     Mental Status: He is alert.  Psychiatric:        Mood and Affect: Mood and affect normal.     ED Results / Procedures / Treatments   Labs (all labs ordered are listed, but only abnormal results are displayed) Labs Reviewed - No data to display  EKG None  Radiology DG Ribs Unilateral W/Chest Left  Result Date: 10/02/2020 CLINICAL DATA:  Fall EXAM: LEFT RIBS AND CHEST - 3+ VIEW COMPARISON:  None. FINDINGS: No fracture or other bone lesions are seen involving the ribs. There is no evidence of pneumothorax or pleural effusion. Coarse interstitial opacities are seen in both lungs. Mild pulmonary vascular congestion as well. The aorta is calcified. The remaining cardiomediastinal contours are unremarkable for portable technique. IMPRESSION: 1. No acute rib fracture or other acute osseous abnormality of the chest wall. 2. Coarse interstitial opacities in both lungs could reflect interstitial edema, infection or chronic interstitial disease in the absence of comparison. 3. Mild pulmonary vascular congestion. Electronically Signed   By: Lovena Le M.D.   On: 10/02/2020 20:27   CT Head Wo Contrast  Result Date: 10/02/2020 CLINICAL DATA:  Fall with head trauma EXAM: CT HEAD WITHOUT CONTRAST CT MAXILLOFACIAL WITHOUT CONTRAST CT CERVICAL SPINE WITHOUT CONTRAST TECHNIQUE: Multidetector CT imaging of the head, cervical spine, and maxillofacial structures were performed using the standard protocol without intravenous contrast. Multiplanar CT image reconstructions of the cervical spine and maxillofacial structures were also generated. COMPARISON:  None. FINDINGS: CT HEAD FINDINGS Brain: No intracranial hemorrhage or  extra-axial collection. No mass effect. Generalized volume loss with mild white matter hypoattenuation. Vascular: Small amount of atherosclerotic calcification of the internal carotid arteries at skull base. Skull: Left frontal scalp abrasion.  No skull fracture. Other: None CT MAXILLOFACIAL FINDINGS Osseous: No fracture.  No mandibular dislocation. Orbits: Normal Sinuses: Mild mucosal thickening in the left maxillary sinus. Soft tissues: Small left facial hematoma. CT CERVICAL SPINE FINDINGS Alignment: Normal Skull base and vertebrae: No fracture Soft tissues and spinal canal: Spinal canal is patent. Disc levels: Severe right C4 foraminal stenosis due to uncovertebral and facet hypertrophy. Upper chest: Negative Other: None IMPRESSION: 1. No acute intracranial abnormality. 2. Left frontal scalp abrasion  and small left facial hematoma without skull fracture. 3. No acute fracture or static subluxation of the cervical spine. 4. Severe right C4 foraminal stenosis due to uncovertebral and facet hypertrophy. Electronically Signed   By: Ulyses Jarred M.D.   On: 10/02/2020 20:40   CT Cervical Spine Wo Contrast  Result Date: 10/02/2020 CLINICAL DATA:  Fall with head trauma EXAM: CT HEAD WITHOUT CONTRAST CT MAXILLOFACIAL WITHOUT CONTRAST CT CERVICAL SPINE WITHOUT CONTRAST TECHNIQUE: Multidetector CT imaging of the head, cervical spine, and maxillofacial structures were performed using the standard protocol without intravenous contrast. Multiplanar CT image reconstructions of the cervical spine and maxillofacial structures were also generated. COMPARISON:  None. FINDINGS: CT HEAD FINDINGS Brain: No intracranial hemorrhage or extra-axial collection. No mass effect. Generalized volume loss with mild white matter hypoattenuation. Vascular: Small amount of atherosclerotic calcification of the internal carotid arteries at skull base. Skull: Left frontal scalp abrasion.  No skull fracture. Other: None CT MAXILLOFACIAL FINDINGS  Osseous: No fracture.  No mandibular dislocation. Orbits: Normal Sinuses: Mild mucosal thickening in the left maxillary sinus. Soft tissues: Small left facial hematoma. CT CERVICAL SPINE FINDINGS Alignment: Normal Skull base and vertebrae: No fracture Soft tissues and spinal canal: Spinal canal is patent. Disc levels: Severe right C4 foraminal stenosis due to uncovertebral and facet hypertrophy. Upper chest: Negative Other: None IMPRESSION: 1. No acute intracranial abnormality. 2. Left frontal scalp abrasion and small left facial hematoma without skull fracture. 3. No acute fracture or static subluxation of the cervical spine. 4. Severe right C4 foraminal stenosis due to uncovertebral and facet hypertrophy. Electronically Signed   By: Ulyses Jarred M.D.   On: 10/02/2020 20:40   DG Knee Complete 4 Views Left  Result Date: 10/02/2020 CLINICAL DATA:  Fall, multiple lacerations and bruising EXAM: LEFT KNEE - COMPLETE 4+ VIEW COMPARISON:  None. FINDINGS: Soft tissue swelling and thickening is seen anteriorly. Correlate for contusive change in at most minimal prepatellar bursitis. No sizeable joint effusion. No acute bony abnormality. Specifically, no fracture, subluxation, or dislocation. Bony excrescence along the superomedial aspect of the medial femoral condyle may be on the spectrum of a Pellegrini-Stieda lesion which could implicate a remote MCL injury. Enthesopathic changes are noted along the extensor mechanism with superior inferior anterior patellar spurring. Corticated os ossific fragment anteroinferior to the patella may reflect a fragmented osteophyte or sequela of prior trauma, unlikely to be acute. Vascular calcium in the soft tissues. IMPRESSION: 1. Soft tissue swelling and thickening anteriorly. Correlate for contusive change and/or trace prepatellar bursitis. 2. No acute osseous abnormality. 3. Bony excrescence along the medial femoral condyle, possibly Pellegrini-Stieda lesion which could implicate a  remote MCL injury. 4. Tricompartmental degenerative changes and enthesopathy along the extensor mechanism. 5. Corticated fragment near the anteroinferior patella likely degenerative or remote traumatic. Electronically Signed   By: Lovena Le M.D.   On: 10/02/2020 20:21   CT Maxillofacial Wo Contrast  Result Date: 10/02/2020 CLINICAL DATA:  Fall with head trauma EXAM: CT HEAD WITHOUT CONTRAST CT MAXILLOFACIAL WITHOUT CONTRAST CT CERVICAL SPINE WITHOUT CONTRAST TECHNIQUE: Multidetector CT imaging of the head, cervical spine, and maxillofacial structures were performed using the standard protocol without intravenous contrast. Multiplanar CT image reconstructions of the cervical spine and maxillofacial structures were also generated. COMPARISON:  None. FINDINGS: CT HEAD FINDINGS Brain: No intracranial hemorrhage or extra-axial collection. No mass effect. Generalized volume loss with mild white matter hypoattenuation. Vascular: Small amount of atherosclerotic calcification of the internal carotid arteries at skull base. Skull: Left  frontal scalp abrasion.  No skull fracture. Other: None CT MAXILLOFACIAL FINDINGS Osseous: No fracture.  No mandibular dislocation. Orbits: Normal Sinuses: Mild mucosal thickening in the left maxillary sinus. Soft tissues: Small left facial hematoma. CT CERVICAL SPINE FINDINGS Alignment: Normal Skull base and vertebrae: No fracture Soft tissues and spinal canal: Spinal canal is patent. Disc levels: Severe right C4 foraminal stenosis due to uncovertebral and facet hypertrophy. Upper chest: Negative Other: None IMPRESSION: 1. No acute intracranial abnormality. 2. Left frontal scalp abrasion and small left facial hematoma without skull fracture. 3. No acute fracture or static subluxation of the cervical spine. 4. Severe right C4 foraminal stenosis due to uncovertebral and facet hypertrophy. Electronically Signed   By: Ulyses Jarred M.D.   On: 10/02/2020 20:40    Procedures .Marland KitchenLaceration  Repair  Date/Time: 10/02/2020 10:20 PM Performed by: Rodney Booze, PA-C Authorized by: Rodney Booze, PA-C   Consent:    Consent obtained:  Verbal   Consent given by:  Patient   Risks, benefits, and alternatives were discussed: yes     Risks discussed:  Infection, pain, poor cosmetic result and need for additional repair   Alternatives discussed:  No treatment Universal protocol:    Patient identity confirmed:  Verbally with patient Anesthesia:    Anesthesia method:  Local infiltration   Local anesthetic:  Lidocaine 2% WITH epi Laceration details:    Location:  Face   Face location:  L eyebrow   Length (cm):  3 Pre-procedure details:    Preparation:  Patient was prepped and draped in usual sterile fashion and imaging obtained to evaluate for foreign bodies Exploration:    Limited defect created (wound extended): no     Hemostasis achieved with:  Direct pressure and epinephrine   Wound exploration: wound explored through full range of motion and entire depth of wound visualized     Contaminated: no   Treatment:    Area cleansed with:  Saline   Amount of cleaning:  Extensive   Irrigation solution:  Sterile saline   Irrigation method:  Pressure wash   Visualized foreign bodies/material removed: no     Debridement:  None   Scar revision: no   Skin repair:    Repair method:  Sutures   Suture size:  6-0   Wound skin closure material used: vicryl.   Suture technique:  Simple interrupted   Number of sutures:  7 Approximation:    Approximation:  Close Repair type:    Repair type:  Simple Post-procedure details:    Dressing:  Open (no dressing)   Procedure completion:  Tolerated .Marland KitchenLaceration Repair  Date/Time: 10/02/2020 10:21 PM Performed by: Rodney Booze, PA-C Authorized by: Rodney Booze, PA-C   Consent:    Consent obtained:  Verbal   Consent given by:  Patient   Risks, benefits, and alternatives were discussed: yes     Risks discussed:  Infection,  pain, poor cosmetic result and need for additional repair   Alternatives discussed:  No treatment Universal protocol:    Patient identity confirmed:  Verbally with patient Anesthesia:    Anesthesia method:  Local infiltration   Local anesthetic:  Lidocaine 2% WITH epi Laceration details:    Location:  Face   Face location:  Forehead   Length (cm):  1 Pre-procedure details:    Preparation:  Patient was prepped and draped in usual sterile fashion and imaging obtained to evaluate for foreign bodies Exploration:    Limited defect created (wound extended):  no     Hemostasis achieved with:  Direct pressure and epinephrine   Wound exploration: wound explored through full range of motion and entire depth of wound visualized     Contaminated: no   Treatment:    Area cleansed with:  Saline   Amount of cleaning:  Extensive   Irrigation solution:  Sterile saline   Irrigation method:  Pressure wash   Visualized foreign bodies/material removed: no     Debridement:  None   Undermining:  None   Scar revision: no   Skin repair:    Repair method:  Sutures   Suture size:  6-0   Wound skin closure material used: vicryl.   Suture technique:  Simple interrupted   Number of sutures:  2 Approximation:    Approximation:  Close Repair type:    Repair type:  Simple Post-procedure details:    Dressing:  Open (no dressing)   Procedure completion:  Tolerated .Marland KitchenLaceration Repair  Date/Time: 10/02/2020 10:22 PM Performed by: Rodney Booze, PA-C Authorized by: Rodney Booze, PA-C   Consent:    Consent obtained:  Verbal   Consent given by:  Patient   Risks, benefits, and alternatives were discussed: yes     Risks discussed:  Infection, pain, need for additional repair and poor cosmetic result   Alternatives discussed:  No treatment Universal protocol:    Patient identity confirmed:  Verbally with patient Anesthesia:    Anesthesia method:  Local infiltration   Local anesthetic:  Lidocaine  2% w/o epi Laceration details:    Location: L knee.   Length (cm):  1 Pre-procedure details:    Preparation:  Patient was prepped and draped in usual sterile fashion and imaging obtained to evaluate for foreign bodies Exploration:    Limited defect created (wound extended): no     Hemostasis achieved with:  Direct pressure and epinephrine   Wound exploration: wound explored through full range of motion and entire depth of wound visualized     Contaminated: no   Treatment:    Area cleansed with:  Saline   Amount of cleaning:  Extensive   Irrigation solution:  Sterile saline   Irrigation method:  Pressure wash   Visualized foreign bodies/material removed: no     Debridement:  None   Scar revision: no   Skin repair:    Repair method:  Sutures   Suture size:  3-0   Wound skin closure material used: vicryl.   Suture technique:  Simple interrupted   Number of sutures:  3 Approximation:    Approximation:  Close Repair type:    Repair type:  Simple Post-procedure details:    Dressing:  Non-adherent dressing   Procedure completion:  Tolerated     Medications Ordered in ED Medications  lidocaine-EPINEPHrine (XYLOCAINE W/EPI) 2 %-1:200000 (PF) injection 20 mL (has no administration in time range)  Tdap (BOOSTRIX) injection 0.5 mL (0.5 mLs Intramuscular Given 10/02/20 2031)  acetaminophen (TYLENOL) tablet 650 mg (650 mg Oral Given 10/02/20 2027)    ED Course  I have reviewed the triage vital signs and the nursing notes.  Pertinent labs & imaging results that were available during my care of the patient were reviewed by me and considered in my medical decision making (see chart for details).    MDM Rules/Calculators/A&P                          87 y/o M with mechanical fall pta.  Head trauma, no loc. Laceration to the face and left knee.   Reviewed/interpreted imaging Ct head/maxillofacial/cervical spine -  1. No acute intracranial abnormality. 2. Left frontal scalp abrasion and  small left facial hematoma without skull fracture. 3. No acute fracture or static subluxation of the cervical spine. 4. Severe right C4 foraminal stenosis due to uncovertebral and facet hypertrophy. Xray left knee - 1. Soft tissue swelling and thickening anteriorly. Correlate for contusive change and/or trace prepatellar bursitis. 2. No acute osseous abnormality. 3. Bony excrescence along the medial femoral condyle, possibly Pellegrini-Stieda lesion which could implicate a remote MCL injury. 4. Tricompartmental degenerative changes and enthesopathy along the extensor mechanism. 5. Corticated fragment near the anteroinferior patella likely degenerative or remote traumatic. Xray left ribs/chest - 1. No acute rib fracture or other acute osseous abnormality of the chest wall. 2. Coarse interstitial opacities in both lungs could reflect interstitial edema, infection or chronic interstitial disease in the absence of comparison. 3. Mild pulmonary vascular congestion.  Pressure irrigation performed. Wound explored and base of wound visualized in a bloodless field without evidence of foreign body.  Laceration occurred < 8 hours prior to repair which was well tolerated.  Tdap updated.  Pt will be d/c with abx. Sutures are absorbable and will not need to be removed.  Discussed suture home care with patient and answered questions. they are to return to the ED sooner for signs of infection. Pt is hemodynamically stable with no complaints prior to dc.     Final Clinical Impression(s) / ED Diagnoses Final diagnoses:  Fall, initial encounter  Facial laceration, initial encounter  Laceration of left knee, initial encounter    Rx / DC Orders ED Discharge Orders         Ordered    cephALEXin (KEFLEX) 500 MG capsule  4 times daily        10/02/20 2234           Rodney Booze, PA-C 10/02/20 2234    Drenda Freeze, MD 10/03/20 [id number]

## 2020-10-02 NOTE — Discharge Instructions (Addendum)
The patient had sutures placed to his knee and forehead.  These are absorbable sutures and will not need to be removed.  He was given antibiotics to help prevent any infection in the wounds.  He had a CT scan of his head, face and cervical spine and it did not show any evidence of any traumatic fractures or intracranial injuries.  He had a chest x-ray did not show any rib fractures and also had an x-ray of the knee that did not show any fractures of the knee.  He will need to follow-up with his regular doctor in 1 week for reassessment and return to the ER for any new or worsening symptoms in the meantime.

## 2020-10-02 NOTE — ED Notes (Signed)
Discharged no concerns at this time. Left with SNF transportation.

## 2020-10-02 NOTE — ED Notes (Signed)
Patient has returned from CT

## 2020-10-02 NOTE — ED Triage Notes (Signed)
[facility] after tripping over tire stop in parking lot at [employer]. Multiple lacerations and abrasions, lac over left eye, left knee with puncture wound, fingers and hands abrasions. Pt did not have any LOC.

## 2020-10-04 DIAGNOSIS — J310 Chronic rhinitis: Secondary | ICD-10-CM | POA: Diagnosis not present

## 2020-10-04 DIAGNOSIS — H903 Sensorineural hearing loss, bilateral: Secondary | ICD-10-CM | POA: Diagnosis not present

## 2020-10-04 DIAGNOSIS — H6123 Impacted cerumen, bilateral: Secondary | ICD-10-CM | POA: Diagnosis not present

## 2020-10-04 DIAGNOSIS — J343 Hypertrophy of nasal turbinates: Secondary | ICD-10-CM | POA: Diagnosis not present

## 2020-10-04 DIAGNOSIS — J342 Deviated nasal septum: Secondary | ICD-10-CM | POA: Diagnosis not present

## 2020-10-09 DIAGNOSIS — M179 Osteoarthritis of knee, unspecified: Secondary | ICD-10-CM | POA: Diagnosis not present

## 2020-10-09 DIAGNOSIS — E785 Hyperlipidemia, unspecified: Secondary | ICD-10-CM | POA: Diagnosis not present

## 2020-10-09 DIAGNOSIS — H903 Sensorineural hearing loss, bilateral: Secondary | ICD-10-CM | POA: Diagnosis not present

## 2020-10-09 DIAGNOSIS — J310 Chronic rhinitis: Secondary | ICD-10-CM | POA: Diagnosis not present

## 2020-10-09 DIAGNOSIS — G4700 Insomnia, unspecified: Secondary | ICD-10-CM | POA: Diagnosis not present

## 2020-10-09 DIAGNOSIS — R32 Unspecified urinary incontinence: Secondary | ICD-10-CM | POA: Diagnosis not present

## 2020-10-09 DIAGNOSIS — E538 Deficiency of other specified B group vitamins: Secondary | ICD-10-CM | POA: Diagnosis not present

## 2020-10-09 DIAGNOSIS — W19XXXD Unspecified fall, subsequent encounter: Secondary | ICD-10-CM | POA: Diagnosis not present

## 2020-10-09 DIAGNOSIS — R0789 Other chest pain: Secondary | ICD-10-CM | POA: Diagnosis not present

## 2020-10-09 DIAGNOSIS — K5730 Diverticulosis of large intestine without perforation or abscess without bleeding: Secondary | ICD-10-CM | POA: Diagnosis not present

## 2020-10-09 DIAGNOSIS — M545 Low back pain, unspecified: Secondary | ICD-10-CM | POA: Diagnosis not present

## 2020-10-09 DIAGNOSIS — R351 Nocturia: Secondary | ICD-10-CM | POA: Diagnosis not present

## 2020-10-11 ENCOUNTER — Other Ambulatory Visit (HOSPITAL_BASED_OUTPATIENT_CLINIC_OR_DEPARTMENT_OTHER): Payer: Self-pay

## 2020-10-11 ENCOUNTER — Encounter (HOSPITAL_BASED_OUTPATIENT_CLINIC_OR_DEPARTMENT_OTHER): Payer: Self-pay | Admitting: *Deleted

## 2020-10-11 ENCOUNTER — Other Ambulatory Visit: Payer: Self-pay

## 2020-10-11 ENCOUNTER — Emergency Department (HOSPITAL_BASED_OUTPATIENT_CLINIC_OR_DEPARTMENT_OTHER): Payer: Medicare Other | Admitting: Radiology

## 2020-10-11 ENCOUNTER — Emergency Department (HOSPITAL_BASED_OUTPATIENT_CLINIC_OR_DEPARTMENT_OTHER)
Admission: EM | Admit: 2020-10-11 | Discharge: 2020-10-11 | Disposition: A | Discharge status: Home / Self Care | Payer: Medicare Other | Attending: Emergency Medicine | Admitting: Emergency Medicine

## 2020-10-11 DIAGNOSIS — Z4802 Encounter for removal of sutures: Secondary | ICD-10-CM | POA: Insufficient documentation

## 2020-10-11 DIAGNOSIS — R079 Chest pain, unspecified: Secondary | ICD-10-CM | POA: Diagnosis present

## 2020-10-11 DIAGNOSIS — S29011A Strain of muscle and tendon of front wall of thorax, initial encounter: Secondary | ICD-10-CM

## 2020-10-11 DIAGNOSIS — Z79899 Other long term (current) drug therapy: Secondary | ICD-10-CM | POA: Diagnosis not present

## 2020-10-11 DIAGNOSIS — W19XXXA Unspecified fall, initial encounter: Secondary | ICD-10-CM | POA: Diagnosis not present

## 2020-10-11 DIAGNOSIS — S01112D Laceration without foreign body of left eyelid and periocular area, subsequent encounter: Secondary | ICD-10-CM | POA: Insufficient documentation

## 2020-10-11 DIAGNOSIS — K219 Gastro-esophageal reflux disease without esophagitis: Secondary | ICD-10-CM | POA: Diagnosis not present

## 2020-10-11 DIAGNOSIS — R0781 Pleurodynia: Secondary | ICD-10-CM | POA: Diagnosis not present

## 2020-10-11 DIAGNOSIS — R918 Other nonspecific abnormal finding of lung field: Secondary | ICD-10-CM | POA: Diagnosis not present

## 2020-10-11 DIAGNOSIS — R0789 Other chest pain: Secondary | ICD-10-CM | POA: Insufficient documentation

## 2020-10-11 LAB — CBC WITH DIFFERENTIAL/PLATELET
Abs Immature Granulocytes: 0.02 10*3/uL (ref 0.00–0.07)
Basophils Absolute: 0 10*3/uL (ref 0.0–0.1)
Basophils Relative: 0 %
Eosinophils Absolute: 0.1 10*3/uL (ref 0.0–0.5)
Eosinophils Relative: 1 %
HCT: 44.9 % (ref 39.0–52.0)
Hemoglobin: 15.3 g/dL (ref 13.0–17.0)
Immature Granulocytes: 0 %
Lymphocytes Relative: 14 %
Lymphs Abs: 1.4 10*3/uL (ref 0.7–4.0)
MCH: 32.8 pg (ref 26.0–34.0)
MCHC: 34.1 g/dL (ref 30.0–36.0)
MCV: 96.1 fL (ref 80.0–100.0)
Monocytes Absolute: 1.2 10*3/uL — ABNORMAL HIGH (ref 0.1–1.0)
Monocytes Relative: 12 %
Neutro Abs: 7.6 10*3/uL (ref 1.7–7.7)
Neutrophils Relative %: 73 %
Platelets: 224 10*3/uL (ref 150–400)
RBC: 4.67 MIL/uL (ref 4.22–5.81)
RDW: 13.2 % (ref 11.5–15.5)
WBC: 10.3 10*3/uL (ref 4.0–10.5)
nRBC: 0 % (ref 0.0–0.2)

## 2020-10-11 LAB — BASIC METABOLIC PANEL
Anion gap: 8 (ref 5–15)
BUN: 13 mg/dL (ref 8–23)
CO2: 27 mmol/L (ref 22–32)
Calcium: 9.1 mg/dL (ref 8.9–10.3)
Chloride: 100 mmol/L (ref 98–111)
Creatinine, Ser: 0.81 mg/dL (ref 0.61–1.24)
GFR, Estimated: >60 mL/min (ref >60)
Glucose, Bld: 103 mg/dL — ABNORMAL HIGH (ref 70–99)
Potassium: 4.4 mmol/L (ref 3.5–5.1)
Sodium: 135 mmol/L (ref 135–145)

## 2020-10-11 LAB — TROPONIN I (HIGH SENSITIVITY): Troponin I (High Sensitivity): 5 ng/L (ref <18)

## 2020-10-11 MED ORDER — KETOROLAC TROMETHAMINE 15 MG/ML IJ SOLN
15.0000 mg | Freq: Once | INTRAMUSCULAR | Status: AC
  Administered 2020-10-11: 15 mg via INTRAVENOUS
  Filled 2020-10-11: qty 1

## 2020-10-11 MED ORDER — METHOCARBAMOL 500 MG PO TABS
500.0000 mg | ORAL_TABLET | Freq: Three times a day (TID) | ORAL | 0 refills | Status: DC | PRN
  Filled 2020-10-11 (×2): qty 30, 10d supply, fill #0

## 2020-10-11 MED ORDER — ACETAMINOPHEN 500 MG PO TABS
1000.0000 mg | ORAL_TABLET | Freq: Once | ORAL | Status: DC
  Filled 2020-10-11: qty 2

## 2020-10-11 MED ORDER — METHOCARBAMOL 500 MG PO TABS: 500.0000 mg | ORAL_TABLET | Freq: Three times a day (TID) | ORAL | 0 refills | Status: DC | PRN

## 2020-10-11 NOTE — ED Provider Notes (Signed)
[place] EMERGENCY DEPT Provider Note   CSN: [id number] Arrival date & time: 10/11/20  1034     History Chief Complaint  Patient presents with  . Chest Pain    Adrian Ray is a 87 y.o. male.  I presents to ER with concern for chest pain.  Patient had fall on the ninth of this month and came to the ER.  At that time received wound care, laceration repair.  Imaging studies were negative.  Discharged.  States around 24 hours after the fall he started experiencing left-sided chest pains.  Pain occur whenever moving, getting up or down, touching chest wall.  Worse on left side, sharp and stabbing.  Currently mild.  At times severe.  Nonradiating.  No difficulty in breathing.  Has tried Tylenol with minimal relief.  Primary doctor prescribed diclofenac cream but he was hesitant to try this due to concern for side effects.  HPI     Past Medical History:  Diagnosis Date  . Cataracts, bilateral   . GERD (gastroesophageal reflux disease)   . Hyperlipemia     There are no problems to display for this patient.   Past Surgical History:  Procedure Laterality Date  . APPENDECTOMY         No family history on file.  Social History   Tobacco Use  . Smoking status: Never Smoker  . Smokeless tobacco: Never Used  Vaping Use  . Vaping Use: Never used  Substance Use Topics  . Alcohol use: Yes    Comment: Once a day. Beer, wine, or liquor.   . Drug use: No    Home Medications Prior to Admission medications   Medication Sig Start Date End Date Taking? Authorizing Provider  atorvastatin (LIPITOR) 10 MG tablet Take 10 mg by mouth See admin instructions. Takes 1 tablet on Monday, Wednesday and Friday 02/10/18  Yes [provider]  fluticasone (FLONASE) 50 MCG/ACT nasal spray Place 1 spray into both nostrils daily as needed for allergies. 01/16/16   [provider]    Allergies    Tetracycline and Codeine  Review of Systems   Review of Systems   Constitutional: Negative for chills and fever.  HENT: Negative for ear pain and sore throat.   Eyes: Negative for pain and visual disturbance.  Respiratory: Negative for cough and shortness of breath.   Cardiovascular: Positive for chest pain. Negative for palpitations.  Gastrointestinal: Negative for abdominal pain and vomiting.  Genitourinary: Negative for dysuria and hematuria.  Musculoskeletal: Negative for arthralgias and back pain.  Skin: Negative for color change and rash.  Neurological: Negative for seizures and syncope.  All other systems reviewed and are negative.   Physical Exam Updated Vital Signs BP (!) 154/76 (BP Location: Right Arm)   Pulse 76   Temp 98.4 F (36.9 C) (Oral)   Resp 18   Ht 6\' 2"  (1.88 m)   Wt 83.9 kg   SpO2 99%   BMI 23.75 kg/m   Physical Exam Vitals and nursing note reviewed.  Constitutional:      Appearance: He is well-developed.  HENT:     Head: Normocephalic.     Comments: Laceration above left eyebrow appears to be healing well Eyes:     Conjunctiva/sclera: Conjunctivae normal.  Cardiovascular:     Rate and Rhythm: Normal rate and regular rhythm.     Heart sounds: No murmur heard.   Pulmonary:     Effort: Pulmonary effort is normal. No respiratory distress.  Breath sounds: Normal breath sounds.  Chest:     Comments: There is tenderness over the left anterior chest wall, left lateral chest wall, no crepitus or deformity noted Abdominal:     Palpations: Abdomen is soft.     Tenderness: There is no abdominal tenderness.  Musculoskeletal:     Cervical back: Neck supple.  Skin:    General: Skin is warm and dry.     Comments: Superficial abrasions over anterior knees appear to be healing well  Neurological:     General: No focal deficit present.     Mental Status: He is alert.  Psychiatric:        Mood and Affect: Mood normal.        Behavior: Behavior normal.     ED Results / Procedures / Treatments   Labs (all labs  ordered are listed, but only abnormal results are displayed) Labs Reviewed  CBC WITH DIFFERENTIAL/PLATELET - Abnormal; Notable for the following components:      Result Value   Monocytes Absolute 1.2 (*)    All other components within normal limits  BASIC METABOLIC PANEL  TROPONIN I (HIGH SENSITIVITY)    EKG None  Radiology No results found.  Procedures .Suture Removal  Date/Time: 10/11/2020 2:16 PM Performed by: Lucrezia Starch, MD Authorized by: Lucrezia Starch, MD   Consent:    Consent obtained:  Verbal   Consent given by:  Patient   Risks, benefits, and alternatives were discussed: yes     Risks discussed:  Bleeding   Alternatives discussed:  No treatment Procedure details:    Wound appearance:  No signs of infection, clean and good wound healing   Number of sutures removed:  7 Post-procedure details:    Post-removal:  No dressing applied   Procedure completion:  Tolerated well, no immediate complications     Medications Ordered in ED Medications  ketorolac (TORADOL) 15 MG/ML injection 15 mg (has no administration in time range)  acetaminophen (TYLENOL) tablet 1,000 mg (has no administration in time range)    ED Course  I have reviewed the triage vital signs and the nursing notes.  Pertinent labs & imaging results that were available during my care of the patient were reviewed by me and considered in my medical decision making (see chart for details).    MDM Rules/Calculators/A&P                         87 year old male presents to ER with concern for left-sided chest pain associated with recent fall.  On exam he is well-appearing with stable vital signs.  Chest x-ray negative for any rib fractures.  Basic labs stable.  EKG without acute ischemic change and troponin within normal limits.  Suspect MSK strain related to the fall.  Recommended symptomatic management and follow-up with primary care.  The laceration above his left eyebrow appears to be healing  well.  Vicryl sutures were placed 9 days ago and were still intact.  Removed sutures.   After the discussed management above, the patient was determined to be safe for discharge.  The patient was in agreement with this plan and all questions regarding their care were answered.  ED return precautions were discussed and the patient will return to the ED with any significant worsening of condition.   Final Clinical Impression(s) / ED Diagnoses Final diagnoses:  None    Rx / DC Orders ED Discharge Orders    None  Lucrezia Starch, MD 10/11/20 [phone]

## 2020-10-11 NOTE — Discharge Instructions (Signed)
Continue the medicine as previously prescribed by your primary care doctor as needed.  I additionally would recommend taking Tylenol and the prescribed muscle relaxer, Robaxin.  Please note this can make you slightly drowsy and should not be taken while driving or operating heavy machinery.  If you are pain becomes worse, you develop difficulty in breathing, fever or other new concerning symptom, return to ER for reassessment.  Otherwise I recommend a recheck with your primary doctor later this week.

## 2020-10-11 NOTE — Progress Notes (Signed)
Patient arrived with chest pain. Patient placed on monitor. Patient endorses Shob with pain and had a recent fall. O2 sats were acceptable on RA @ 99%. Patient has no documented pulmonary hx or meds in Epic.

## 2020-10-11 NOTE — ED Notes (Signed)
ED Provider at bedside. 

## 2020-10-11 NOTE — ED Triage Notes (Signed)
Central chest pain started on the 9th of this month after the fall.

## 2020-10-15 DIAGNOSIS — W19XXXA Unspecified fall, initial encounter: Secondary | ICD-10-CM | POA: Diagnosis not present

## 2020-10-15 DIAGNOSIS — H2513 Age-related nuclear cataract, bilateral: Secondary | ICD-10-CM | POA: Diagnosis not present

## 2020-10-15 DIAGNOSIS — H25013 Cortical age-related cataract, bilateral: Secondary | ICD-10-CM | POA: Diagnosis not present

## 2020-10-15 DIAGNOSIS — S0012XA Contusion of left eyelid and periocular area, initial encounter: Secondary | ICD-10-CM | POA: Diagnosis not present

## 2020-10-17 DIAGNOSIS — L570 Actinic keratosis: Secondary | ICD-10-CM | POA: Diagnosis not present

## 2020-10-17 DIAGNOSIS — Z85820 Personal history of malignant melanoma of skin: Secondary | ICD-10-CM | POA: Diagnosis not present

## 2020-10-17 DIAGNOSIS — L821 Other seborrheic keratosis: Secondary | ICD-10-CM | POA: Diagnosis not present

## 2020-10-17 DIAGNOSIS — Z85828 Personal history of other malignant neoplasm of skin: Secondary | ICD-10-CM | POA: Diagnosis not present

## 2020-10-22 ENCOUNTER — Emergency Department (HOSPITAL_BASED_OUTPATIENT_CLINIC_OR_DEPARTMENT_OTHER): Payer: Medicare Other

## 2020-10-22 ENCOUNTER — Other Ambulatory Visit: Payer: Self-pay

## 2020-10-22 ENCOUNTER — Encounter (HOSPITAL_BASED_OUTPATIENT_CLINIC_OR_DEPARTMENT_OTHER): Payer: Self-pay | Admitting: Emergency Medicine

## 2020-10-22 ENCOUNTER — Inpatient Hospital Stay (HOSPITAL_BASED_OUTPATIENT_CLINIC_OR_DEPARTMENT_OTHER)
Admission: EM | Admit: 2020-10-22 | Discharge: 2020-10-24 | DRG: 690 | Disposition: A | Discharge status: Home Health | Payer: Medicare Other | Attending: Internal Medicine | Admitting: Internal Medicine

## 2020-10-22 DIAGNOSIS — R000 Tachycardia, unspecified: Secondary | ICD-10-CM | POA: Diagnosis not present

## 2020-10-22 DIAGNOSIS — R911 Solitary pulmonary nodule: Secondary | ICD-10-CM | POA: Diagnosis present

## 2020-10-22 DIAGNOSIS — E871 Hypo-osmolality and hyponatremia: Secondary | ICD-10-CM | POA: Diagnosis not present

## 2020-10-22 DIAGNOSIS — Z20822 Contact with and (suspected) exposure to covid-19: Secondary | ICD-10-CM | POA: Diagnosis present

## 2020-10-22 DIAGNOSIS — N390 Urinary tract infection, site not specified: Secondary | ICD-10-CM | POA: Diagnosis not present

## 2020-10-22 DIAGNOSIS — R7989 Other specified abnormal findings of blood chemistry: Secondary | ICD-10-CM | POA: Diagnosis present

## 2020-10-22 DIAGNOSIS — Z881 Allergy status to other antibiotic agents status: Secondary | ICD-10-CM

## 2020-10-22 DIAGNOSIS — R072 Precordial pain: Secondary | ICD-10-CM | POA: Diagnosis not present

## 2020-10-22 DIAGNOSIS — E785 Hyperlipidemia, unspecified: Secondary | ICD-10-CM | POA: Diagnosis present

## 2020-10-22 DIAGNOSIS — R079 Chest pain, unspecified: Secondary | ICD-10-CM

## 2020-10-22 DIAGNOSIS — N3001 Acute cystitis with hematuria: Secondary | ICD-10-CM

## 2020-10-22 DIAGNOSIS — D72829 Elevated white blood cell count, unspecified: Secondary | ICD-10-CM

## 2020-10-22 DIAGNOSIS — R410 Disorientation, unspecified: Secondary | ICD-10-CM

## 2020-10-22 DIAGNOSIS — J9811 Atelectasis: Secondary | ICD-10-CM | POA: Diagnosis not present

## 2020-10-22 DIAGNOSIS — R531 Weakness: Secondary | ICD-10-CM | POA: Diagnosis not present

## 2020-10-22 DIAGNOSIS — E861 Hypovolemia: Secondary | ICD-10-CM | POA: Diagnosis present

## 2020-10-22 LAB — BASIC METABOLIC PANEL
Anion gap: 8 (ref 5–15)
BUN: 14 mg/dL (ref 8–23)
CO2: 20 mmol/L — ABNORMAL LOW (ref 22–32)
Calcium: 7.2 mg/dL — ABNORMAL LOW (ref 8.9–10.3)
Chloride: 103 mmol/L (ref 98–111)
Creatinine, Ser: 0.76 mg/dL (ref 0.61–1.24)
GFR, Estimated: >60 mL/min (ref >60)
Glucose, Bld: 84 mg/dL (ref 70–99)
Potassium: 3.9 mmol/L (ref 3.5–5.1)
Sodium: 131 mmol/L — ABNORMAL LOW (ref 135–145)

## 2020-10-22 LAB — URINALYSIS, ROUTINE W REFLEX MICROSCOPIC
Bilirubin Urine: NEGATIVE
Glucose, UA: NEGATIVE mg/dL
Ketones, ur: NEGATIVE mg/dL
Nitrite: NEGATIVE
Protein, ur: 30 mg/dL — AB
Specific Gravity, Urine: 1.012 (ref 1.005–1.030)
WBC, UA: >50 WBC/hpf — ABNORMAL HIGH (ref 0–5)
pH: 6.5 (ref 5.0–8.0)

## 2020-10-22 LAB — CBC
HCT: 34.6 % — ABNORMAL LOW (ref 39.0–52.0)
Hemoglobin: 12.2 g/dL — ABNORMAL LOW (ref 13.0–17.0)
MCH: 33.2 pg (ref 26.0–34.0)
MCHC: 35.3 g/dL (ref 30.0–36.0)
MCV: 94 fL (ref 80.0–100.0)
Platelets: 151 10*3/uL (ref 150–400)
RBC: 3.68 MIL/uL — ABNORMAL LOW (ref 4.22–5.81)
RDW: 13 % (ref 11.5–15.5)
WBC: 14.8 10*3/uL — ABNORMAL HIGH (ref 4.0–10.5)
nRBC: 0 % (ref 0.0–0.2)

## 2020-10-22 LAB — LACTIC ACID, PLASMA: Lactic Acid, Venous: 1.5 mmol/L (ref 0.5–1.9)

## 2020-10-22 LAB — TROPONIN I (HIGH SENSITIVITY)
Troponin I (High Sensitivity): 21 ng/L — ABNORMAL HIGH (ref <18)
Troponin I (High Sensitivity): 6 ng/L (ref <18)

## 2020-10-22 LAB — D-DIMER, QUANTITATIVE: D-Dimer, Quant: 3.33 ug/mL-FEU — ABNORMAL HIGH (ref 0.00–0.50)

## 2020-10-22 MED ORDER — IOHEXOL 350 MG/ML SOLN
100.0000 mL | Freq: Once | INTRAVENOUS | Status: AC | PRN
  Administered 2020-10-22: 100 mL via INTRAVENOUS

## 2020-10-22 MED ORDER — SODIUM CHLORIDE 0.9 % IV SOLN
1.0000 g | Freq: Once | INTRAVENOUS | Status: AC
  Administered 2020-10-22: 1 g via INTRAVENOUS
  Filled 2020-10-22: qty 10

## 2020-10-22 MED ORDER — SODIUM CHLORIDE 0.9 % IV BOLUS
500.0000 mL | Freq: Once | INTRAVENOUS | Status: AC
  Administered 2020-10-22: 500 mL via INTRAVENOUS

## 2020-10-22 NOTE — ED Triage Notes (Signed)
Pt lives at [facility]. He was brought in by security at the facility. He went to visit his wife today and she felt like he didn't look right so she wanted him to be checked. Pt is extremely hard of hearing. Pt c/o chest pain (states it always hurts) and generalized weakness.

## 2020-10-22 NOTE — ED Provider Notes (Signed)
[facility] EMERGENCY DEPT Provider Note   CSN: [id number] Arrival date & time: 10/22/20  1821     History Chief Complaint  Patient presents with  . Weakness  . Chest Pain    Adrian Ray is a 87 y.o. male.  Adrian Ray was also recently evaluated in the emergency department for his ongoing left-sided chest pain.  Serial troponins were within normal limits.  Today, his wife noted that he looked a little bit ill.  The patient states that he was weak all over for a short period of time but that he feels better now.  He has been taking a muscle relaxer for his chest pain.  He reports no new symptoms, but he states that the pain in his chest has been quite limiting.  The history is provided by the patient. History limited by: very hard of hearing.  Chest Pain Pain location:  L chest Pain quality: stabbing   Pain radiates to:  Does not radiate Pain severity:  Severe Onset quality:  Sudden Timing:  Constant Progression:  Worsening Chronicity:  New Context comment:  Fell 10/02/20 and sustained a painful injury to his chest. X-rays have been normal. Relieved by:  Nothing Worsened by:  Certain positions and coughing Associated symptoms: no abdominal pain, no back pain, no cough, no diaphoresis, no fever, no nausea, no palpitations, no shortness of breath and no vomiting        Past Medical History:  Diagnosis Date  . Cataracts, bilateral   . GERD (gastroesophageal reflux disease)   . Hyperlipemia     There are no problems to display for this patient.   Past Surgical History:  Procedure Laterality Date  . APPENDECTOMY         No family history on file.  Social History   Tobacco Use  . Smoking status: Never Smoker  . Smokeless tobacco: Never Used  Vaping Use  . Vaping Use: Never used  Substance Use Topics  . Alcohol use: Yes    Comment: Once a day. Beer, wine, or liquor.   . Drug use: No    Home Medications Prior to Admission medications    Medication Sig Start Date End Date Taking? Authorizing Provider  atorvastatin (LIPITOR) 10 MG tablet Take 10 mg by mouth See admin instructions. Takes 1 tablet on Monday, Wednesday and Friday 02/10/18   [provider]  fluticasone (FLONASE) 50 MCG/ACT nasal spray Place 1 spray into both nostrils daily as needed for allergies. 01/16/16   [provider]  methocarbamol (ROBAXIN) 500 MG tablet Take 1 tablet (500 mg total) by mouth every 8 (eight) hours as needed for muscle spasms. 10/11/20   Lucrezia Starch, MD    Allergies    Tetracycline and Codeine  Review of Systems   Review of Systems  Constitutional: Negative for chills, diaphoresis and fever.  HENT: Negative for ear pain and sore throat.   Eyes: Negative for pain and visual disturbance.  Respiratory: Negative for cough and shortness of breath.   Cardiovascular: Positive for chest pain. Negative for palpitations.  Gastrointestinal: Negative for abdominal pain, nausea and vomiting.  Genitourinary: Negative for dysuria and hematuria.  Musculoskeletal: Negative for arthralgias and back pain.  Skin: Negative for color change and rash.  Neurological: Negative for seizures and syncope.  All other systems reviewed and are negative.   Physical Exam Updated Vital Signs BP (!) 117/57   Pulse (!) 108   Temp 98.6 F (37 C) (Oral)  Resp 20   Wt 86.2 kg   SpO2 96%   BMI 24.39 kg/m   Physical Exam Vitals and nursing note reviewed.  Constitutional:      Appearance: He is well-developed.  HENT:     Head: Normocephalic and atraumatic.  Eyes:     Conjunctiva/sclera: Conjunctivae normal.  Cardiovascular:     Rate and Rhythm: Regular rhythm. Tachycardia present.     Heart sounds: No murmur heard.   Pulmonary:     Effort: Pulmonary effort is normal. No respiratory distress.     Breath sounds: Normal breath sounds.  Chest:     Chest wall: Tenderness present.    Abdominal:     Palpations: Abdomen is soft.      Tenderness: There is no abdominal tenderness.  Musculoskeletal:     Cervical back: Neck supple.  Skin:    General: Skin is warm and dry.  Neurological:     Mental Status: He is alert.     ED Results / Procedures / Treatments   Labs (all labs ordered are listed, but only abnormal results are displayed) Labs Reviewed  BASIC METABOLIC PANEL - Abnormal; Notable for the following components:      Result Value   Sodium 131 (*)    CO2 20 (*)    Calcium 7.2 (*)    All other components within normal limits  CBC - Abnormal; Notable for the following components:   WBC 14.8 (*)    RBC 3.68 (*)    Hemoglobin 12.2 (*)    HCT 34.6 (*)    All other components within normal limits  D-DIMER, QUANTITATIVE - Abnormal; Notable for the following components:   D-Dimer, Quant 3.33 (*)    All other components within normal limits  URINALYSIS, ROUTINE W REFLEX MICROSCOPIC - Abnormal; Notable for the following components:   APPearance HAZY (*)    Hgb urine dipstick SMALL (*)    Protein, ur 30 (*)    Leukocytes,Ua LARGE (*)    WBC, UA >50 (*)    Bacteria, UA MANY (*)    All other components within normal limits  TROPONIN I (HIGH SENSITIVITY) - Abnormal; Notable for the following components:   Troponin I (High Sensitivity) 21 (*)    All other components within normal limits  RESP PANEL BY RT-PCR (FLU A&B, COVID) ARPGX2  URINE CULTURE  LACTIC ACID, PLASMA  LACTIC ACID, PLASMA  TROPONIN I (HIGH SENSITIVITY)    EKG EKG Interpretation  Date/Time:  Tuesday October 22 2020 18:35:18 EDT Ventricular Rate:  102 PR Interval:  157 QRS Duration: 92 QT Interval:  334 QTC Calculation: 435 R Axis:   78 Text Interpretation: Sinus tachycardia Atrial premature complex no acute ischemia axis normal Confirmed by Lorre Munroe ([id number]) on 10/22/2020 6:53:13 PM   Radiology DG Chest 1 View  Result Date: 10/22/2020 CLINICAL DATA:  Left chest pain EXAM: CHEST  1 VIEW COMPARISON:  10/11/2020 FINDINGS: Thoracic  spondylosis. Mildly low lung volumes. Chronic fine reticular interstitial accentuation in the lung bases. No pneumothorax or discrete airspace opacity identified. IMPRESSION: 1. A specific cause for left chest pain is not identified. 2. Stable fine reticular accentuation in the lung bases. 3. Thoracic spondylosis. Electronically Signed   By: Van Clines M.D.   On: 10/22/2020 19:44   CT Angio Chest PE W/Cm &/Or Wo Cm  Result Date: 10/22/2020 CLINICAL DATA:  Chest pain and weakness EXAM: CT ANGIOGRAPHY CHEST WITH CONTRAST TECHNIQUE: Multidetector CT imaging of the chest was performed  using the standard protocol during bolus administration of intravenous contrast. Multiplanar CT image reconstructions and MIPs were obtained to evaluate the vascular anatomy. CONTRAST:  162mL OMNIPAQUE IOHEXOL 350 MG/ML SOLN COMPARISON:  None. FINDINGS: Cardiovascular: Contrast injection is sufficient to demonstrate satisfactory opacification of the pulmonary arteries to the segmental level. There is no pulmonary embolus or evidence of right heart strain. The size of the main pulmonary artery is normal. Heart size is normal, with no pericardial effusion. The course and caliber of the aorta are normal. There is atherosclerotic calcification. No acute aortic syndrome. Mediastinum/Nodes: No mediastinal, hilar or axillary lymphadenopathy. Normal visualized thyroid. Thoracic esophageal course is normal. Lungs/Pleura: Bilateral dependent atelectasis. Right apical nodule measures 6 mm (7:73). Upper Abdomen: Contrast bolus timing is not optimized for evaluation of the abdominal organs. The visualized portions of the organs of the upper abdomen are normal. Musculoskeletal: No chest wall abnormality. No bony spinal canal stenosis. Review of the MIP images confirms the above findings. IMPRESSION: 1. No pulmonary embolus or other acute thoracic abnormality. 2. 6 mm right apical pulmonary nodule. Non-contrast chest CT at 6-12 months is  recommended. If the nodule is stable at time of repeat CT, then future CT at 18-24 months (from today's scan) is considered optional for low-risk patients, but is recommended for high-risk patients. This recommendation follows the consensus statement: Guidelines for Management of Incidental Pulmonary Nodules Detected on CT Images: From the Fleischner Society 2017; Radiology 2017; 284:228-243. Aortic atherosclerosis (ICD10-I70.0). Electronically Signed   By: Ulyses Jarred M.D.   On: 10/22/2020 21:40    Procedures Procedures   Medications Ordered in ED Medications  sodium chloride 0.9 % bolus 500 mL (has no administration in time range)    ED Course  I have reviewed the triage vital signs and the nursing notes.  Pertinent labs & imaging results that were available during my care of the patient were reviewed by me and considered in my medical decision making (see chart for details).  Clinical Course as of 10/22/20 2310  Tue Oct 22, 2020  2307 I spoke with Dr. Blima Singer with [facility].  [AW]    Clinical Course User Index [AW] Arnaldo Natal, MD   MDM Rules/Calculators/A&P                          Adrian Ray presented with left-sided chest pain after a trauma occurring 3 weeks ago. His wife noted that he was diffusely weak and possibly febrile. He was tachycardic, and I was suspicious for infection or PE. He was found to have a UTI with evidence of systemic infection.  He did have an elevated white count.  Troponin slightly bumped.  Lactic acid was within normal limits, and he does not have evidence of sepsis.  Nonetheless, I think he is slightly confused, and with his lab abnormalities, I think he would benefit from an observation admission.  Will be transferred to [facility] or [facility] for further treatment. Final Clinical Impression(s) / ED Diagnoses Final diagnoses:  Acute cystitis with hematuria  Delirium    Rx / DC Orders ED Discharge Orders    None       Arnaldo Natal,  MD 10/22/20 2318

## 2020-10-23 ENCOUNTER — Encounter (HOSPITAL_COMMUNITY): Payer: Self-pay | Admitting: Family Medicine

## 2020-10-23 DIAGNOSIS — D72829 Elevated white blood cell count, unspecified: Secondary | ICD-10-CM

## 2020-10-23 DIAGNOSIS — N390 Urinary tract infection, site not specified: Secondary | ICD-10-CM | POA: Diagnosis present

## 2020-10-23 DIAGNOSIS — R410 Disorientation, unspecified: Secondary | ICD-10-CM | POA: Diagnosis present

## 2020-10-23 DIAGNOSIS — R7989 Other specified abnormal findings of blood chemistry: Secondary | ICD-10-CM | POA: Diagnosis present

## 2020-10-23 DIAGNOSIS — Z20822 Contact with and (suspected) exposure to covid-19: Secondary | ICD-10-CM | POA: Diagnosis present

## 2020-10-23 DIAGNOSIS — R911 Solitary pulmonary nodule: Secondary | ICD-10-CM | POA: Diagnosis present

## 2020-10-23 DIAGNOSIS — E785 Hyperlipidemia, unspecified: Secondary | ICD-10-CM | POA: Diagnosis present

## 2020-10-23 DIAGNOSIS — E861 Hypovolemia: Secondary | ICD-10-CM | POA: Diagnosis present

## 2020-10-23 DIAGNOSIS — E871 Hypo-osmolality and hyponatremia: Secondary | ICD-10-CM | POA: Diagnosis present

## 2020-10-23 DIAGNOSIS — Z881 Allergy status to other antibiotic agents status: Secondary | ICD-10-CM | POA: Diagnosis not present

## 2020-10-23 DIAGNOSIS — R072 Precordial pain: Secondary | ICD-10-CM | POA: Diagnosis present

## 2020-10-23 DIAGNOSIS — R079 Chest pain, unspecified: Secondary | ICD-10-CM

## 2020-10-23 LAB — CBC
HCT: 38.9 % — ABNORMAL LOW (ref 39.0–52.0)
Hemoglobin: 13 g/dL (ref 13.0–17.0)
MCH: 32.8 pg (ref 26.0–34.0)
MCHC: 33.4 g/dL (ref 30.0–36.0)
MCV: 98.2 fL (ref 80.0–100.0)
Platelets: 150 10*3/uL (ref 150–400)
RBC: 3.96 MIL/uL — ABNORMAL LOW (ref 4.22–5.81)
RDW: 13.2 % (ref 11.5–15.5)
WBC: 21.7 10*3/uL — ABNORMAL HIGH (ref 4.0–10.5)
nRBC: 0 % (ref 0.0–0.2)

## 2020-10-23 LAB — BASIC METABOLIC PANEL
Anion gap: 7 (ref 5–15)
BUN: 12 mg/dL (ref 8–23)
CO2: 24 mmol/L (ref 22–32)
Calcium: 8.5 mg/dL — ABNORMAL LOW (ref 8.9–10.3)
Chloride: 103 mmol/L (ref 98–111)
Creatinine, Ser: 0.91 mg/dL (ref 0.61–1.24)
GFR, Estimated: >60 mL/min (ref >60)
Glucose, Bld: 119 mg/dL — ABNORMAL HIGH (ref 70–99)
Potassium: 4.1 mmol/L (ref 3.5–5.1)
Sodium: 134 mmol/L — ABNORMAL LOW (ref 135–145)

## 2020-10-23 LAB — RESP PANEL BY RT-PCR (FLU A&B, COVID) ARPGX2
Influenza A by PCR: NEGATIVE
Influenza B by PCR: NEGATIVE
SARS Coronavirus 2 by RT PCR: NEGATIVE

## 2020-10-23 LAB — TROPONIN I (HIGH SENSITIVITY)
Troponin I (High Sensitivity): 18 ng/L — ABNORMAL HIGH (ref <18)
Troponin I (High Sensitivity): 16 ng/L (ref <18)

## 2020-10-23 MED ORDER — ASPIRIN EC 81 MG PO TBEC
81.0000 mg | DELAYED_RELEASE_TABLET | Freq: Every day | ORAL | Status: DC
  Administered 2020-10-23 – 2020-10-24 (×2): 81 mg via ORAL
  Filled 2020-10-23 (×2): qty 1

## 2020-10-23 MED ORDER — ACETAMINOPHEN 650 MG RE SUPP: 650.0000 mg | Freq: Four times a day (QID) | RECTAL | Status: DC | PRN

## 2020-10-23 MED ORDER — ACETAMINOPHEN 500 MG PO TABS
1000.0000 mg | ORAL_TABLET | Freq: Once | ORAL | Status: AC
  Administered 2020-10-23: 1000 mg via ORAL
  Filled 2020-10-23: qty 2

## 2020-10-23 MED ORDER — SENNOSIDES-DOCUSATE SODIUM 8.6-50 MG PO TABS: 1.0000 | ORAL_TABLET | Freq: Every evening | ORAL | Status: DC | PRN

## 2020-10-23 MED ORDER — SODIUM CHLORIDE 0.9 % IV SOLN
1.0000 g | INTRAVENOUS | Status: DC
  Administered 2020-10-23: 1 g via INTRAVENOUS
  Filled 2020-10-23: qty 1

## 2020-10-23 MED ORDER — NAPROXEN 500 MG PO TABS: 500.0000 mg | ORAL_TABLET | Freq: Two times a day (BID) | ORAL | Status: DC | PRN

## 2020-10-23 MED ORDER — ACETAMINOPHEN 325 MG PO TABS
650.0000 mg | ORAL_TABLET | Freq: Four times a day (QID) | ORAL | Status: DC | PRN
  Administered 2020-10-23 – 2020-10-24 (×3): 650 mg via ORAL
  Filled 2020-10-23 (×3): qty 2

## 2020-10-23 MED ORDER — ENOXAPARIN SODIUM 40 MG/0.4ML ~~LOC~~ SOLN
40.0000 mg | SUBCUTANEOUS | Status: DC
  Administered 2020-10-23: 40 mg via SUBCUTANEOUS
  Filled 2020-10-23: qty 0.4

## 2020-10-23 MED ORDER — LACTATED RINGERS IV SOLN: INTRAVENOUS | Status: DC

## 2020-10-23 NOTE — Plan of Care (Signed)
  Problem: Education: Goal: Knowledge of General Education information will improve Description: Including pain rating scale, medication(s)/side effects and non-pharmacologic comfort measures Outcome: Progressing   Problem: Health Behavior/Discharge Planning: Goal: Ability to manage health-related needs will improve Outcome: Progressing   Problem: Clinical Measurements: Goal: Ability to maintain clinical measurements within normal limits will improve Outcome: Progressing Goal: Will remain free from infection Outcome: Progressing Goal: Diagnostic test results will improve Outcome: Progressing Goal: Respiratory complications will improve Outcome: Progressing Goal: Cardiovascular complication will be avoided Outcome: Progressing   Problem: Nutrition: Goal: Adequate nutrition will be maintained Outcome: Progressing   Problem: Coping: Goal: Level of anxiety will decrease Outcome: Progressing   Problem: Elimination: Goal: Will not experience complications related to bowel motility Outcome: Progressing Goal: Will not experience complications related to urinary retention Outcome: Progressing   Problem: Pain Managment: Goal: General experience of comfort will improve Outcome: Progressing   Problem: Safety: Goal: Ability to remain free from injury will improve Outcome: Progressing   Problem: Skin Integrity: Goal: Risk for impaired skin integrity will decrease Outcome: Progressing   Problem: Urinary Elimination: Goal: Signs and symptoms of infection will decrease Outcome: Progressing

## 2020-10-23 NOTE — TOC Transition Note (Signed)
Transition of Care [facility]) - CM/SW Discharge Note   Patient Details  Name: ARTIE MCINTYRE MRN: [id number] Date of Birth: Oct 30, 1933  Transition of Care [facility]) CM/SW Contact:  Dessa Phi, RN Phone Number: 10/23/2020, 3:41 PM   Clinical Narrative: For return back to [facility] w/HHPT-provided by [facility] Will contact their security for transportation back-tel#[phone].      Final next level of care: [place] Barriers to Discharge: No Barriers Identified   Patient Goals and CMS Choice Patient states their goals for this hospitalization and ongoing recovery are:: return back to [facility] CMS Medicare.gov Compare Post Acute Care list provided to:: Patient Represenative (must comment)    Discharge Placement                       Discharge Plan and Services   Discharge Planning Services: CM Consult                      HH Arranged: PT HH Agency:  ([facility] has their own HHPT.) Date HH Agency Contacted: 10/23/20 Time [place]: [place] Representative spoke with at [place]: [place] ([place]) Interventions     Readmission Risk Interventions No flowsheet data found.

## 2020-10-23 NOTE — TOC Initial Note (Signed)
Transition of Care [facility]) - Initial/Assessment Note    Patient Details  Name: Adrian Ray MRN: [id number] Date of Birth: [birth year]  Transition of Care [facility]) CM/SW Contact:    Dessa Phi, RN Phone Number: 10/23/2020, 8:54 AM  Clinical Narrative: Spoke to spouse Susan/[facility] nurse manager Sandra-d/c plan return back to [facility] ADL's,& ambulation.[facility] has own therapy if needed, & their own transportation for return back contact Chauncey [phone] @ d/c.                 Expected Discharge Plan: Home/Self Care Barriers to Discharge: Continued Medical Work up   Patient Goals and CMS Choice Patient states their goals for this hospitalization and ongoing recovery are:: return back to [facility] CMS Medicare.gov Compare Post Acute Care list provided to:: Patient Represenative (must comment)    Expected Discharge Plan and Services Expected Discharge Plan: Home/Self Care   Discharge Planning Services: CM Consult   Living arrangements for the past 2 months: [place]                                      Prior Living Arrangements/Services Living arrangements for the past 2 months: [place] Lives with:: Self Patient language and need for interpreter reviewed:: Yes Do you feel safe going back to the place where you live?: Yes      Need for Family Participation in Patient Care: No (Comment) Care giver support system in place?: Yes (comment)   Criminal Activity/Legal Involvement Pertinent to Current Situation/Hospitalization: No - Comment as needed  Activities of Daily Living Home Assistive Devices/Equipment: Eyeglasses,Other (Comment) ADL Screening (condition at time of admission) Patient's cognitive ability adequate to safely complete daily activities?: Yes Is the patient deaf or have difficulty hearing?: Yes Does the patient have difficulty seeing, even when wearing  glasses/contacts?: No Does the patient have difficulty concentrating, remembering, or making decisions?: No Patient able to express need for assistance with ADLs?: Yes Does the patient have difficulty dressing or bathing?: Yes Independently performs ADLs?: No Communication: Independent Dressing (OT): Needs assistance Grooming: Needs assistance Feeding: Independent Bathing: Needs assistance Toileting: Needs assistance In/Out Bed: Needs assistance Does the patient have difficulty walking or climbing stairs?: Yes Weakness of Legs: Both Weakness of Arms/Hands: None  Permission Sought/Granted Permission sought to share information with : Case Manager Permission granted to share information with : Yes, Verbal Permission Granted  Share Information with NAME: Case Manager     Permission granted to share info w Relationship: Manuela Schwartz spouse [phone]     Emotional Assessment Appearance:: Appears stated age Attitude/Demeanor/Rapport: Gracious Affect (typically observed): Accepting Orientation: : Oriented to Self,Oriented to Place,Oriented to  Time,Oriented to Situation Alcohol / Substance Use: Not Applicable Psych Involvement: No (comment)  Admission diagnosis:  Delirium [R41.0] Acute UTI [N39.0] Acute cystitis with hematuria [N30.01] Patient Active Problem List   Diagnosis Date Noted  . Delirium 10/23/2020  . Leukocytosis 10/23/2020  . Chest pain 10/23/2020  . Acute UTI 10/22/2020   PCP:  Pcp, No Pharmacy:   [facility] [place], [address] [place] [address] Phone: [phone] Fax: [phone]  [place] at [facility] [address] [place] [address] Phone: [phone] Fax: [phone]     Social Determinants of Health (SDOH) Interventions  Readmission Risk Interventions No flowsheet data found.

## 2020-10-23 NOTE — Plan of Care (Signed)
Pt alert, follows command, very HOH , comfortably sleeping at the moment, VSS and will continue plan of care.  Problem: Clinical Measurements: Goal: Ability to maintain clinical measurements within normal limits will improve Outcome: Progressing   Problem: Clinical Measurements: Goal: Will remain free from infection Outcome: Progressing   Problem: Safety: Goal: Ability to remain free from injury will improve Outcome: Progressing   Problem: Urinary Elimination: Goal: Signs and symptoms of infection will decrease Outcome: Progressing  Problem: Elimination: Goal: Will not experience complications related to bowel motility Outcome: Progressing

## 2020-10-23 NOTE — Progress Notes (Signed)
PROGRESS NOTE    Adrian Ray  ION:[id number] DOB: 21-Jun-1934 DOA: 10/22/2020 PCP: Josetta Huddle, MD    Brief Narrative:  Adrian Ray is an 87 year old male with past medical history significant for hyperlipidemia, GERD, cataracts, hard of hearing who presented as a transfer from [place] complaining of left-sided chest pain.  He reports fell 3 weeks ago and was seen at [facility], ED.  X-rays were negative and patient was subsequently discharged home.  Patient reports landing on his chest with pain worsened by deep breaths, coughs or sneezes.  Denies radiation to his shoulder, arm, neck or jaw.  No history of cardiovascular disease.  Also reports increased urinary frequency but denies dysuria or pelvic pain.  Denies any blood in his urine.  Reports normal bowel movements.  No recent fever.  Wife reported to the ED physician that patient had been more confused and not acting like himself.  In the ED, temperature 98.6 F, HR 108, RR 20, BP 117/57, SPO2 96% on room air.  Sodium 131, potassium 3.9, chloride 103, CO2 20, glucose 84, BUN 14, creatinine 0.76.  High-sensitivity troponin 21.  Lactic acid 1.5.  WBC 14.8, hemoglobin 12.2, platelets 151.  Urinalysis with large leukocytes, negative nitrite, many bacteria, greater than 50 WBCs.  D-dimer elevated 3.33.  Chest x-ray with stable fine reticular attenuation in the lung bases, otherwise no acute cardiopulmonary disease process.  CT angiogram chest negative for PE or other acute thoracic abnormality, but incidental finding 6 mm right apical pulmonary nodule.   Assessment & Plan:   Principal Problem:   Acute UTI Active Problems:   Delirium   Leukocytosis   Chest pain   Urinary tract infection Urinalysis with large leukocytes, negative nitrite, many bacteria, greater than 50 WBCs.  WBC 14.8.  Lactic acid 1.5.  No previous urine cultures in the EMR for review. --WBC 14.8>21.7 --Urine culture: Pending --Ceftriaxone 1 g IV every 24  hours --Repeat CBC in the a.m.  Atypical chest pain Patient reports persistent left-sided chest discomfort, worse with inspiration.  Was noted to have an elevated D-dimer, CT angiogram chest negative for pulmonary embolism or other acute intrathoracic abnormality.  High-sensitivity troponin 21>6>18>16, flat. EKG reviewed with sinus tachycardia, rate 102, QTc 435 without dynamic EKG changes.  Suspect etiology musculoskeletal following fall.  --Continue monitor on telemetry  Pulmonary nodule Incidental finding of 6 mm pulmonary nodule right apical segment on CT angiogram chest.  Recommend repeat CT chest 6/12 months.  Hyponatremia Sodium 131 on presentation.  Suspect hypovolemic hyponatremia from poor oral intake. --Na 131>134 --Continue LR at 100 mL/h --Encourage increase oral intake --Repeat BMP in the a.m.  HLD: Continue Crestor   DVT prophylaxis: Lovenox   Code Status: Full Code Family Communication: Attempted to update patient's spouse, Manuela Schwartz via telephone, unsuccessful.  Message left.  Disposition Plan:  Level of care: Telemetry Status is: Inpatient  Remains inpatient appropriate because:Ongoing diagnostic testing needed not appropriate for outpatient work up, Unsafe d/c plan, IV treatments appropriate due to intensity of illness or inability to take PO and Inpatient level of care appropriate due to severity of illness   Dispo: The patient is from: [facility]              Anticipated d/c is to: ALF  [facility]              Patient currently is not medically stable to d/c.   Difficult to place patient No   Consultants:   none  Procedures:   None  Antimicrobials:   Ceftriaxone 3/29>>   Subjective: Patient seen and examined bedside, resting comfortably.  Believes his dizziness was from receiving Robaxin recently.  Wants to know when he is able to discharge home.  Discussed with him waiting for urine culture to return.  Also need PT/OT to evaluate him given his  falls.  No current chest pain.  No family present at bedside this morning.  No other questions or concerns at this time.  Denies headache, no current dizziness, no current chest pain, no palpitations, no visual changes, no shortness of breath, no abdominal pain, no fever/chills/night sweats, no nausea/vomiting/diarrhea.  No acute events overnight per nursing staff.  Objective: Vitals:   10/23/20 0149 10/23/20 0149 10/23/20 0535 10/23/20 1253  BP: 125/67 125/67 127/60 115/60  Pulse: 78 78 73 65  Resp: 18 18 15 18   Temp: 98.6 F (37 C) 98.6 F (37 C) 98.2 F (36.8 C) 97.7 F (36.5 C)  TempSrc: Oral Oral Oral Oral  SpO2:  96% 99% 100%  Weight: 86 kg     Height: 6\' 2"  (1.88 m)       Intake/Output Summary (Last 24 hours) at 10/23/2020 1417 Last data filed at 10/23/2020 1000 Gross per 24 hour  Intake 1495.88 ml  Output 1450 ml  Net 45.88 ml   Filed Weights   10/22/20 1834 10/23/20 0149  Weight: 86.2 kg 86 kg    Examination:  General exam: Appears calm and comfortable, hard of hearing Respiratory system: Clear to auscultation. Respiratory effort normal.  On room air Cardiovascular system: S1 & S2 heard, RRR. No JVD, murmurs, rubs, gallops or clicks. No pedal edema. Gastrointestinal system: Abdomen is nondistended, soft and nontender. No organomegaly or masses felt. Normal bowel sounds heard. Central nervous system: Alert and oriented. No focal neurological deficits. Extremities: Symmetric 5 x 5 power. Skin: No rashes, lesions or ulcers Psychiatry: Judgement and insight appear normal. Mood & affect appropriate.     Data Reviewed: I have personally reviewed following labs and imaging studies  CBC: Recent Labs  Lab 10/22/20 1851 10/23/20 0523  WBC 14.8* 21.7*  HGB 12.2* 13.0  HCT 34.6* 38.9*  MCV 94.0 98.2  PLT 151 626   Basic Metabolic Panel: Recent Labs  Lab 10/22/20 1851 10/23/20 0523  NA 131* 134*  K 3.9 4.1  CL 103 103  CO2 20* 24  GLUCOSE 84 119*  BUN 14  12  CREATININE 0.76 0.91  CALCIUM 7.2* 8.5*   GFR: Estimated Creatinine Clearance: 70.3 mL/min (by C-G formula based on SCr of 0.91 mg/dL). Liver Function Tests: No results for input(s): AST, ALT, ALKPHOS, BILITOT, PROT, ALBUMIN in the last 168 hours. No results for input(s): LIPASE, AMYLASE in the last 168 hours. No results for input(s): AMMONIA in the last 168 hours. Coagulation Profile: No results for input(s): INR, PROTIME in the last 168 hours. Cardiac Enzymes: No results for input(s): CKTOTAL, CKMB, CKMBINDEX, TROPONINI in the last 168 hours. BNP (last 3 results) No results for input(s): PROBNP in the last 8760 hours. HbA1C: No results for input(s): HGBA1C in the last 72 hours. CBG: No results for input(s): GLUCAP in the last 168 hours. Lipid Profile: No results for input(s): CHOL, HDL, LDLCALC, TRIG, CHOLHDL, LDLDIRECT in the last 72 hours. Thyroid Function Tests: No results for input(s): TSH, T4TOTAL, FREET4, T3FREE, THYROIDAB in the last 72 hours. Anemia Panel: No results for input(s): VITAMINB12, FOLATE, FERRITIN, TIBC, IRON, RETICCTPCT in the last 72 hours. Sepsis Labs:  Recent Labs  Lab 10/22/20 2215  LATICACIDVEN 1.5    Recent Results (from the past 240 hour(s))  Resp Panel by RT-PCR (Flu A&B, Covid) Nasopharyngeal Swab     Status: None   Collection Time: 10/22/20 11:55 PM   Specimen: Nasopharyngeal Swab; Nasopharyngeal(NP) swabs in vial transport medium  Result Value Ref Range Status   SARS Coronavirus 2 by RT PCR NEGATIVE NEGATIVE Final    Comment: (NOTE) SARS-CoV-2 target nucleic acids are NOT DETECTED.  The SARS-CoV-2 RNA is generally detectable in upper respiratory specimens during the acute phase of infection. The lowest concentration of SARS-CoV-2 viral copies this assay can detect is 138 copies/mL. A negative result does not preclude SARS-Cov-2 infection and should not be used as the sole basis for treatment or other patient management decisions. A  negative result may occur with  improper specimen collection/handling, submission of specimen other than nasopharyngeal swab, presence of viral mutation(s) within the areas targeted by this assay, and inadequate number of viral copies(<138 copies/mL). A negative result must be combined with clinical observations, patient history, and epidemiological information. The expected result is Negative.  Fact Sheet for Patients:  EntrepreneurPulse.com.au  Fact Sheet for Healthcare Providers:  IncredibleEmployment.be  This test is no t yet approved or cleared by the [place] FDA and  has been authorized for detection and/or diagnosis of SARS-CoV-2 by FDA under an Emergency Use Authorization (EUA). This EUA will remain  in effect (meaning this test can be used) for the duration of the COVID-19 declaration under Section 564(b)(1) of the Act, 21 U.S.C.section 360bbb-3(b)(1), unless the authorization is terminated  or revoked sooner.       Influenza A by PCR NEGATIVE NEGATIVE Final   Influenza B by PCR NEGATIVE NEGATIVE Final    Comment: (NOTE) The Xpert Xpress SARS-CoV-2/FLU/RSV plus assay is intended as an aid in the diagnosis of influenza from Nasopharyngeal swab specimens and should not be used as a sole basis for treatment. Nasal washings and aspirates are unacceptable for Xpert Xpress SARS-CoV-2/FLU/RSV testing.  Fact Sheet for Patients: EntrepreneurPulse.com.au  Fact Sheet for Healthcare Providers: IncredibleEmployment.be  This test is not yet approved or cleared by the [place] FDA and has been authorized for detection and/or diagnosis of SARS-CoV-2 by FDA under an Emergency Use Authorization (EUA). This EUA will remain in effect (meaning this test can be used) for the duration of the COVID-19 declaration under Section 564(b)(1) of the Act, 21 U.S.C. section 360bbb-3(b)(1), unless the authorization  is terminated or revoked.  Performed at [facility]          Radiology Studies: DG Chest 1 View  Result Date: 10/22/2020 CLINICAL DATA:  Left chest pain EXAM: CHEST  1 VIEW COMPARISON:  10/11/2020 FINDINGS: Thoracic spondylosis. Mildly low lung volumes. Chronic fine reticular interstitial accentuation in the lung bases. No pneumothorax or discrete airspace opacity identified. IMPRESSION: 1. A specific cause for left chest pain is not identified. 2. Stable fine reticular accentuation in the lung bases. 3. Thoracic spondylosis. Electronically Signed   By: Van Clines M.D.   On: 10/22/2020 19:44   CT Angio Chest PE W/Cm &/Or Wo Cm  Result Date: 10/22/2020 CLINICAL DATA:  Chest pain and weakness EXAM: CT ANGIOGRAPHY CHEST WITH CONTRAST TECHNIQUE: Multidetector CT imaging of the chest was performed using the standard protocol during bolus administration of intravenous contrast. Multiplanar CT image reconstructions and MIPs were obtained to evaluate the vascular anatomy. CONTRAST:  12mL OMNIPAQUE IOHEXOL 350 MG/ML SOLN COMPARISON:  None. FINDINGS: Cardiovascular: Contrast injection is sufficient to demonstrate satisfactory opacification of the pulmonary arteries to the segmental level. There is no pulmonary embolus or evidence of right heart strain. The size of the main pulmonary artery is normal. Heart size is normal, with no pericardial effusion. The course and caliber of the aorta are normal. There is atherosclerotic calcification. No acute aortic syndrome. Mediastinum/Nodes: No mediastinal, hilar or axillary lymphadenopathy. Normal visualized thyroid. Thoracic esophageal course is normal. Lungs/Pleura: Bilateral dependent atelectasis. Right apical nodule measures 6 mm (7:73). Upper Abdomen: Contrast bolus timing is not optimized for evaluation of the abdominal organs. The visualized portions of the organs of the upper abdomen are normal. Musculoskeletal: No chest wall  abnormality. No bony spinal canal stenosis. Review of the MIP images confirms the above findings. IMPRESSION: 1. No pulmonary embolus or other acute thoracic abnormality. 2. 6 mm right apical pulmonary nodule. Non-contrast chest CT at 6-12 months is recommended. If the nodule is stable at time of repeat CT, then future CT at 18-24 months (from today's scan) is considered optional for low-risk patients, but is recommended for high-risk patients. This recommendation follows the consensus statement: Guidelines for Management of Incidental Pulmonary Nodules Detected on CT Images: From the Fleischner Society 2017; Radiology 2017; 284:228-243. Aortic atherosclerosis (ICD10-I70.0). Electronically Signed   By: Ulyses Jarred M.D.   On: 10/22/2020 21:40        Scheduled Meds: . aspirin EC  81 mg Oral Daily  . enoxaparin (LOVENOX) injection  40 mg Subcutaneous Q24H   Continuous Infusions: . cefTRIAXone (ROCEPHIN)  IV    . lactated ringers 100 mL/hr at 10/23/20 1000     LOS: 0 days    Time spent: 41 minutes spent on chart review, discussion with nursing staff, consultants, updating family and interview/physical exam; more than 50% of that time was spent in counseling and/or coordination of care.    Donya Hitch J [place] DO [facility] Available via Epic secure chat 7am-7pm After these hours, please refer to coverage provider listed on [email or web address] 10/23/2020, 2:17 PM

## 2020-10-23 NOTE — H&P (Signed)
History and Physical    Adrian Ray DQQ:[id number] DOB: [birth year] DOA: 10/22/2020  PCP: Pcp, No   Patient coming from: Home  Chief Complaint: Left-sided chest pain, urinary frequency  HPI: Adrian Ray is a 87 y.o. male with medical history significant for cataracts, hyperlipidemia, GERD, hard of hearing who presents complaining of central and left-sided chest pain.  He reports he fell 3 weeks ago and was seen at [facility] emergency room at that time.  He fell landing on his chest at that time and chest x-rays were negative then.  He reports he has had this substernal and left-sided chest pain since then.  Pain is exacerbated if he takes a deep breath, coughs or sneezes.  He has not had any associated nausea, vomiting, diaphoresis, shortness of breath.  Does not radiate to his shoulder, left arm, neck or jaw.  He has no history of cardiovascular disease.  He does have high cholesterol and takes Crestor 3 days a week.  He reports he has been having urinary frequency but denies any dysuria or pelvic pain.  He denies any pain over his kidneys.  States he has not noticed any blood in his urine.  States bowel movements have been normal.  Denies having any recent fever.  Wife reported to the emergency room physician that patient had been more confused yesterday and not acting like himself.   ED Course: Patient been hemodynamically stable in the emergency room.  His initial troponin was mildly elevated at 21.  He was found to have a urinary tract infection which was assumed to be the cause of his mild delirium.  Full service was asked to admit for further management.  He was started on Rocephin in the emergency room.  His repeat troponin a few hours after the initial one was decreased to 6  Review of Systems:  General: Denies fever, chills, weight loss, night sweats.  Denies dizziness.  Denies change in appetite HENT: Denies head trauma, headache, denies change in hearing, tinnitus.  Denies bleeding.   Denies sore throat, sores in mouth.  Denies difficulty swallowing Eyes: Denies blurry vision, pain in eye, drainage.  Denies discoloration of eyes. Neck: Denies pain.  Denies swelling.  Denies pain with movement. Cardiovascular: Reports chest pain.  Denies palpitations.  Denies edema.  Denies orthopnea Respiratory: Denies shortness of breath, cough.  Denies wheezing.  Denies sputum production Gastrointestinal: Denies abdominal pain, swelling.  Denies nausea, vomiting, diarrhea.  Denies melena.  Denies hematemesis. Musculoskeletal: Denies limitation of movement.  Denies deformity or swelling.  Denies pain. Genitourinary: Reports urinary frequency.  Denies pelvic pain.  Denies dysuria.  Skin: Denies rash.  Denies petechiae, purpura, ecchymosis. Neurological: Denies headache.  Denies syncope.  Denies seizure activity.  Denies weakness or paresthesia.  Denies slurred speech, drooping face.  Denies visual change. Psychiatric: Denies depression, anxiety. Denies hallucinations.  Past Medical History:  Diagnosis Date  . Cataracts, bilateral   . GERD (gastroesophageal reflux disease)   . Hyperlipemia     Past Surgical History:  Procedure Laterality Date  . APPENDECTOMY      Social History  reports that he has never smoked. He has never used smokeless tobacco. He reports current alcohol use. He reports that he does not use drugs.  Allergies  Allergen Reactions  . Tetracycline     Other reaction(s): Other (See Comments), Unknown Lips swelled   . Codeine     Other reaction(s): Unknown    History reviewed. No pertinent family history.  Prior to Admission medications   Medication Sig Start Date End Date Taking? Authorizing Provider  fluticasone (FLONASE) 50 MCG/ACT nasal spray Place 1 spray into both nostrils daily as needed for allergies. 01/16/16   [provider]  methocarbamol (ROBAXIN) 500 MG tablet Take 1 tablet (500 mg total) by mouth every 8 (eight) hours as needed for  muscle spasms. 10/11/20   Lucrezia Starch, MD  rosuvastatin (CRESTOR) 5 MG tablet Take 1 tablet by mouth See admin instructions. No sig found in Fill Hx on how medication is prescribed 10/03/20   [provider]    Physical Exam: Vitals:   10/22/20 2200 10/22/20 2230 10/23/20 0026 10/23/20 0149  BP: 140/81 127/72 136/69 125/67  Pulse: 100 99 91 78  Resp: 19 (!) 21 16 18   Temp:    98.6 F (37 C)  TempSrc:    Oral  SpO2: 98% 100% 99% 96%  Weight:        Constitutional: NAD, calm, comfortable Vitals:   10/22/20 2200 10/22/20 2230 10/23/20 0026 10/23/20 0149  BP: 140/81 127/72 136/69 125/67  Pulse: 100 99 91 78  Resp: 19 (!) 21 16 18   Temp:    98.6 F (37 C)  TempSrc:    Oral  SpO2: 98% 100% 99% 96%  Weight:       General: WDWN, Alert and oriented x3.  Eyes: EOMI, PERRL, conjunctivae normal.  Sclera nonicteric HENT:  [place]/AT, external ears normal.  Nares patent without epistasis.  Mucous membranes are moist.  Hard of hearing and wears hearing aids. Neck: Soft, normal range of motion, supple, no masses, no thyromegaly.  Trachea midline Respiratory: clear to auscultation bilaterally, no wheezing, no crackles. Normal respiratory effort. No accessory muscle use.  Cardiovascular: Regular rate and rhythm, no murmurs / rubs / gallops. No extremity edema. 2+ pedal pulses.   Abdomen: Soft, no tenderness, nondistended, no rebound or guarding.  No masses palpated. Bowel sounds normoactive Musculoskeletal: FROM. no cyanosis. No joint deformity upper and lower extremities. Normal muscle tone.  Mild tenderness to palpation of lower central sternum but no deformity. Skin: Warm, dry, intact no rashes, lesions, ulcers. No induration Neurologic: CN 2-12 grossly intact.  Normal speech.  Sensation intact, Strength 5/5 in all extremities.   Psychiatric: Normal judgment and insight.  Normal mood.    Labs on Admission: I have personally reviewed following labs and imaging  studies  CBC: Recent Labs  Lab 10/22/20 1851  WBC 14.8*  HGB 12.2*  HCT 34.6*  MCV 94.0  PLT 932    Basic Metabolic Panel: Recent Labs  Lab 10/22/20 1851  NA 131*  K 3.9  CL 103  CO2 20*  GLUCOSE 84  BUN 14  CREATININE 0.76  CALCIUM 7.2*    GFR: Estimated Creatinine Clearance: 79.9 mL/min (by C-G formula based on SCr of 0.76 mg/dL).  Liver Function Tests: No results for input(s): AST, ALT, ALKPHOS, BILITOT, PROT, ALBUMIN in the last 168 hours.  Urine analysis:    Component Value Date/Time   COLORURINE YELLOW 10/22/2020 2012   APPEARANCEUR HAZY (A) 10/22/2020 2012   LABSPEC 1.012 10/22/2020 2012   PHURINE 6.5 10/22/2020 2012   GLUCOSEU NEGATIVE 10/22/2020 2012   HGBUR SMALL (A) 10/22/2020 2012   BILIRUBINUR NEGATIVE 10/22/2020 2012   [place] 10/22/2020 2012   PROTEINUR 30 (A) 10/22/2020 2012   UROBILINOGEN 1.0 05/08/2010 2151   NITRITE NEGATIVE 10/22/2020 2012   LEUKOCYTESUR LARGE (A) 10/22/2020 2012    Radiological Exams on Admission:  DG Chest 1 View  Result Date: 10/22/2020 CLINICAL DATA:  Left chest pain EXAM: CHEST  1 VIEW COMPARISON:  10/11/2020 FINDINGS: Thoracic spondylosis. Mildly low lung volumes. Chronic fine reticular interstitial accentuation in the lung bases. No pneumothorax or discrete airspace opacity identified. IMPRESSION: 1. A specific cause for left chest pain is not identified. 2. Stable fine reticular accentuation in the lung bases. 3. Thoracic spondylosis. Electronically Signed   By: Van Clines M.D.   On: 10/22/2020 19:44   CT Angio Chest PE W/Cm &/Or Wo Cm  Result Date: 10/22/2020 CLINICAL DATA:  Chest pain and weakness EXAM: CT ANGIOGRAPHY CHEST WITH CONTRAST TECHNIQUE: Multidetector CT imaging of the chest was performed using the standard protocol during bolus administration of intravenous contrast. Multiplanar CT image reconstructions and MIPs were obtained to evaluate the vascular anatomy. CONTRAST:  110mL  OMNIPAQUE IOHEXOL 350 MG/ML SOLN COMPARISON:  None. FINDINGS: Cardiovascular: Contrast injection is sufficient to demonstrate satisfactory opacification of the pulmonary arteries to the segmental level. There is no pulmonary embolus or evidence of right heart strain. The size of the main pulmonary artery is normal. Heart size is normal, with no pericardial effusion. The course and caliber of the aorta are normal. There is atherosclerotic calcification. No acute aortic syndrome. Mediastinum/Nodes: No mediastinal, hilar or axillary lymphadenopathy. Normal visualized thyroid. Thoracic esophageal course is normal. Lungs/Pleura: Bilateral dependent atelectasis. Right apical nodule measures 6 mm (7:73). Upper Abdomen: Contrast bolus timing is not optimized for evaluation of the abdominal organs. The visualized portions of the organs of the upper abdomen are normal. Musculoskeletal: No chest wall abnormality. No bony spinal canal stenosis. Review of the MIP images confirms the above findings. IMPRESSION: 1. No pulmonary embolus or other acute thoracic abnormality. 2. 6 mm right apical pulmonary nodule. Non-contrast chest CT at 6-12 months is recommended. If the nodule is stable at time of repeat CT, then future CT at 18-24 months (from today's scan) is considered optional for low-risk patients, but is recommended for high-risk patients. This recommendation follows the consensus statement: Guidelines for Management of Incidental Pulmonary Nodules Detected on CT Images: From the Fleischner Society 2017; Radiology 2017; 284:228-243. Aortic atherosclerosis (ICD10-I70.0). Electronically Signed   By: Ulyses Jarred M.D.   On: 10/22/2020 21:40    EKG: Independently reviewed.  EKG shows sinus tachycardia with PACs.  No acute ST elevation or depression.  QTc 435  Assessment/Plan Principal Problem:   Acute UTI Mr. Vandevoort is admitted to telemetry.  Started on Rocephin for antibiotic coverage.  Urine culture obtained emergency  room will be monitored and antibiotic will be adjusted as indicated. IV fluid hydration provided Recheck CBC and BMP in morning  Active Problems:   Delirium Patient with mild delirium secondary to the UTI.  We will continue to monitor.  Patient placed on fall precautions    Leukocytosis Evaded WBC.  Recheck CBC in morning.  Patient on antibiotic    Chest pain Patient reports substernal chest pain that is also painful on the left side that occurs with deep inspiration, cough, sneezing.  His initial troponin was 21 in the emergency room.  Repeat troponin a few hours later was 6.  We will check serial troponins to monitor.  He has no history of coronary artery disease or heart disease.  He is on Crestor for hyperlipidemia and states he takes it every Monday Wednesday Friday.    DVT prophylaxis: Lovenox for DVT prophylaxis Code Status:   Full code Family Communication:  Diagnosis and plan discussed  with patient.  Patient verbalized understanding agrees with plan.  Further recommendations to follow as clinical indicated Disposition Plan:   Patient is from:  Home  Anticipated DC to:  Home  Anticipated DC date:  Anticipate 2 midnight stay in the hospital  Anticipated DC barriers: No as to discharge identified at this time  Admission status:  Inpatient   Yevonne Aline Merlina Marchena MD [facility]  How to contact the [facility] Attending or Consulting provider [place] or covering provider during after hours [place], for this patient?   1. Check the care team in [facility] and look for a) attending/consulting TR[facility] provider listed and b) the [facility] team listed 2. Log into [email or web address] and use [place] universal password to access. If you do not have the password, please contact the hospital operator. 3. Locate the [facility] provider you are looking for under [facility] and page to a number that you can be directly reached. 4. If you still have difficulty reaching the provider, please page the [facility]  (Director on Call) for the Hospitalists listed on amion for assistance.  10/23/2020, 2:35 AM

## 2020-10-23 NOTE — Evaluation (Signed)
Physical Therapy Evaluation Patient Details Name: Adrian Ray MRN: [id number] DOB: [birth year] Today's Date: 10/23/2020   History of Present Illness  87 year old male with past medical history significant for hyperlipidemia, GERD, cataracts, hard of hearing who presented as a transfer from [place] complaining of left-sided chest pain.  He reports fell 3 weeks ago and was seen at [facility] ED.  X-rays were negative and patient was subsequently discharged home.  Pt admitted for UTI and atypical chest pain  Clinical Impression  Pt admitted with above diagnosis.  Pt currently with functional limitations due to the deficits listed below (see PT Problem List). Pt will benefit from skilled PT to increase their independence and safety with mobility to allow discharge to the venue listed below.  Pt reports he lives at [place] in his own apartment and his spouse (who has MS) is in SNF section.  Pt able to stand at sink and brush his teeth and then ambulate in hallway.  Pt does present with gait deficits however appears to be his baseline.  Pt declined using assistive device today however may improved gait stability upon d/c.  Recommend HHPT to continue assistive device education as well as improve gait pattern and balance deficits.  Pt ambulated 800 feet in hallway and no overt LOB observed.  HR remained in 80s bpm in sinus rhythm per telemetry monitor during session     Follow Up Recommendations Home health PT;Supervision - Intermittent    Equipment Recommendations  Rolling walker with 5" wheels (states he has RW "somewhere")    Recommendations for Other Services       Precautions / Restrictions Precautions Precautions: Fall Precaution Comments: HOH      Mobility  Bed Mobility Overal bed mobility: Modified Independent                  Transfers Overall transfer level: Needs assistance Equipment used: None Transfers: Sit to/from Stand Sit to Stand: Min guard          General transfer comment: pt with unsteady rise however utilized bed rail to self steady, also widened BOS; pt declined using RW today  Ambulation/Gait Ambulation/Gait assistance: Min guard Gait Distance (Feet): 800 Feet Assistive device: None Gait Pattern/deviations: Step-through pattern;Decreased stride length Gait velocity: decr   General Gait Details: decreased control observed with midswing to stance phase bilaterally, this appears to be pt's baseline, pt ocassionally grazing hand rail, no overt LOB observed  Stairs            Wheelchair Mobility    Modified Rankin (Stroke Patients Only)       Balance Overall balance assessment: History of Falls                                           Pertinent Vitals/Pain Pain Assessment: No/denies pain ("feels good to walk")    Home Living Family/patient expects to be discharged to:: Assisted living               Home Equipment: Walker - 2 wheels      Prior Function Level of Independence: Independent         Comments: pt reports he lives at [place] in his own apartment; spouse is also at facility however in SNF section     Hand Dominance        Extremity/Trunk Assessment  Lower Extremity Assessment Lower Extremity Assessment: Generalized weakness    Cervical / Trunk Assessment Cervical / Trunk Assessment: Kyphotic  Communication   Communication: HOH (HOH despite hearing aid in place)  Cognition Arousal/Alertness: Awake/alert Behavior During Therapy: WFL for tasks assessed/performed Overall Cognitive Status: Within Functional Limits for tasks assessed                                 General Comments: pt very HOH however appropriately responds to questions and cues      General Comments      Exercises     Assessment/Plan    PT Assessment Patient needs continued PT services  PT Problem List Decreased strength;Decreased mobility;Decreased  balance;Decreased knowledge of use of DME       PT Treatment Interventions Gait training;DME instruction;Balance training;Therapeutic exercise;Functional mobility training;Therapeutic activities;Patient/family education    PT Goals (Current goals can be found in the Care Plan section)  Acute Rehab PT Goals PT Goal Formulation: With patient Time For Goal Achievement: 11/06/20 Potential to Achieve Goals: Good    Frequency Min 3X/week   Barriers to discharge        Co-evaluation               AM-PAC PT "6 Clicks" Mobility  Outcome Measure Help needed turning from your back to your side while in a flat bed without using bedrails?: None Help needed moving from lying on your back to sitting on the side of a flat bed without using bedrails?: None Help needed moving to and from a bed to a chair (including a wheelchair)?: A Little Help needed standing up from a chair using your arms (e.g., wheelchair or bedside chair)?: A Little Help needed to walk in hospital room?: A Little Help needed climbing 3-5 steps with a railing? : A Little 6 Click Score: 20    End of Session Equipment Utilized During Treatment: Gait belt Activity Tolerance: Patient tolerated treatment well Patient left: in chair;with call bell/phone within reach;with chair alarm set Nurse Communication: Mobility status PT Visit Diagnosis: Other abnormalities of gait and mobility (R26.89)    Time: 1165-7903 PT Time Calculation (min) (ACUTE ONLY): 28 min   Charges:   PT Evaluation $PT Eval Low Complexity: 1 Low PT Treatments $Gait Training: 8-22 mins      Jannette Spanner PT, DPT Acute Rehabilitation Services Pager: [phone] Office: [phone]  York Ram E 10/23/2020, 2:45 PM

## 2020-10-24 LAB — BASIC METABOLIC PANEL
Anion gap: 8 (ref 5–15)
BUN: 12 mg/dL (ref 8–23)
CO2: 23 mmol/L (ref 22–32)
Calcium: 8.3 mg/dL — ABNORMAL LOW (ref 8.9–10.3)
Chloride: 100 mmol/L (ref 98–111)
Creatinine, Ser: 0.72 mg/dL (ref 0.61–1.24)
GFR, Estimated: >60 mL/min (ref >60)
Glucose, Bld: 105 mg/dL — ABNORMAL HIGH (ref 70–99)
Potassium: 4.3 mmol/L (ref 3.5–5.1)
Sodium: 131 mmol/L — ABNORMAL LOW (ref 135–145)

## 2020-10-24 LAB — CBC
HCT: 38.2 % — ABNORMAL LOW (ref 39.0–52.0)
Hemoglobin: 13 g/dL (ref 13.0–17.0)
MCH: 32.7 pg (ref 26.0–34.0)
MCHC: 34 g/dL (ref 30.0–36.0)
MCV: 96.2 fL (ref 80.0–100.0)
Platelets: 165 10*3/uL (ref 150–400)
RBC: 3.97 MIL/uL — ABNORMAL LOW (ref 4.22–5.81)
RDW: 13.2 % (ref 11.5–15.5)
WBC: 12.8 10*3/uL — ABNORMAL HIGH (ref 4.0–10.5)
nRBC: 0 % (ref 0.0–0.2)

## 2020-10-24 LAB — MAGNESIUM: Magnesium: 1.9 mg/dL (ref 1.7–2.4)

## 2020-10-24 MED ORDER — ASPIRIN 81 MG PO TBEC: 81.0000 mg | DELAYED_RELEASE_TABLET | Freq: Every day | ORAL | 0 refills | Status: AC

## 2020-10-24 MED ORDER — CEFDINIR 300 MG PO CAPS: 300.0000 mg | ORAL_CAPSULE | Freq: Two times a day (BID) | ORAL | 0 refills | Status: AC

## 2020-10-24 MED ORDER — NAPROXEN 500 MG PO TABS: 500.0000 mg | ORAL_TABLET | Freq: Two times a day (BID) | ORAL | 0 refills | Status: AC | PRN

## 2020-10-24 NOTE — Discharge Summary (Signed)
Physician Discharge Summary  Adrian Ray:[id number] DOB: [birth year] DOA: 10/22/2020  PCP: Josetta Huddle, MD  Admit date: 10/22/2020 Discharge date: 10/24/2020  Admitted From: [facility] Disposition:  [facility]  Recommendations for Outpatient Follow-up:  1. Follow up with PCP in 1-2 weeks 2. Continue antibiotics with cefdinir 300 mg p.o. twice daily to complete 10-day course for UTI 3. Naproxen as needed for musculoskeletal pain 4. Consider follow-up CT chest 6-12 months for incidental finding of pulmonary nodule 5. Please follow up on the following pending results: Urine culture pending at time of discharge  Home Health: PT Equipment/Devices: Walker  Discharge Condition: Stable CODE STATUS: Full code Diet recommendation: Heart healthy diet  History of present illness:  Adrian Ray is an 87 year old male with past medical history significant for hyperlipidemia, GERD, cataracts, hard of hearing who presented as a transfer from [place] complaining of left-sided chest pain.  He reports fell 3 weeks ago and was seen at [facility], ED.  X-rays were negative and patient was subsequently discharged home.  Patient reports landing on his chest with pain worsened by deep breaths, coughs or sneezes.  Denies radiation to his shoulder, arm, neck or jaw.  No history of cardiovascular disease.  Also reports increased urinary frequency but denies dysuria or pelvic pain.  Denies any blood in his urine.  Reports normal bowel movements.  No recent fever.  Wife reported to the ED physician that patient had been more confused and not acting like himself.  In the ED, temperature 98.6 F, HR 108, RR 20, BP 117/57, SPO2 96% on room air.  Sodium 131, potassium 3.9, chloride 103, CO2 20, glucose 84, BUN 14, creatinine 0.76.  High-sensitivity troponin 21.  Lactic acid 1.5.  WBC 14.8, hemoglobin 12.2, platelets 151.  Urinalysis with large leukocytes, negative nitrite, many bacteria, greater than 50  WBCs.  D-dimer elevated 3.33.  Chest x-ray with stable fine reticular attenuation in the lung bases, otherwise no acute cardiopulmonary disease process.  CT angiogram chest negative for PE or other acute thoracic abnormality, but incidental finding 6 mm right apical pulmonary nodule.  Hospital course:  Urinary tract infection Urinalysis with large leukocytes, negative nitrite, many bacteria, greater than 50 WBCs.  WBC 14.8.  Lactic acid 1.5.  No previous urine cultures in the EMR for review.  White cell count elevated up to 21.7.  Patient was started on ceftriaxone 1 g IV every 24 hours with improvement of WBC count to 12.8 at time of discharge.  Urine culture with greater than 100 K gram-negative rods; further pending at time of discharge.  We will continue antibiotics with cefdinir 300 mg p.o. twice daily to complete 10-day course for UTI.  Follow-up finalized urine culture.  Outpatient follow-up with PCP in 1-2 weeks.  Recommend CBC at visit.  Atypical chest pain likely secondary to muscular skeletal pain Patient reports persistent left-sided chest discomfort, worse with inspiration.  Was noted to have an elevated D-dimer, CT angiogram chest negative for pulmonary embolism or other acute intrathoracic abnormality.  High-sensitivity troponin 21>6>18>16, flat. EKG reviewed with sinus tachycardia, rate 102, QTc 435 without dynamic EKG changes.  Suspect etiology musculoskeletal following fall.  No concerning arrhythmias on telemetry.  Naproxen as needed.  Pulmonary nodule Incidental finding of 6 mm pulmonary nodule right apical segment on CT angiogram chest. Recommend repeat CT chest 6-12 months.  Hyponatremia Sodium 131 on presentation.  Suspect hypovolemic hyponatremia from poor oral intake.  Treated with IV fluid hydration.  Recommend BMP 1  week.  HLD: Continue Crestor  Discharge Diagnoses:  Principal Problem:   Acute UTI Active Problems:   Delirium   Leukocytosis   Chest  pain    Discharge Instructions  Discharge Instructions    Call MD for:  difficulty breathing, headache or visual disturbances   Complete by: As directed    Call MD for:  extreme fatigue   Complete by: As directed    Call MD for:  persistant dizziness or light-headedness   Complete by: As directed    Call MD for:  persistant nausea and vomiting   Complete by: As directed    Call MD for:  severe uncontrolled pain   Complete by: As directed    Call MD for:  temperature >100.4   Complete by: As directed    Diet - low sodium heart healthy   Complete by: As directed    Increase activity slowly   Complete by: As directed      Allergies as of 10/24/2020      Reactions   Tetracycline    Other reaction(s): Other (See Comments), Unknown Lips swelled   Codeine    Other reaction(s): Unknown      Medication List    STOP taking these medications   methocarbamol 500 MG tablet Commonly known as: ROBAXIN     TAKE these medications   acetaminophen 500 MG tablet Commonly known as: TYLENOL Take 1,000 mg by mouth every 8 (eight) hours.   aspirin 81 MG EC tablet Take 1 tablet (81 mg total) by mouth daily. Swallow whole.   cefdinir 300 MG capsule Commonly known as: OMNICEF Take 1 capsule (300 mg total) by mouth 2 (two) times daily for 8 days.   fluticasone 50 MCG/ACT nasal spray Commonly known as: FLONASE Place 1 spray into both nostrils daily as needed for allergies.   naproxen 500 MG tablet Commonly known as: NAPROSYN Take 1 tablet (500 mg total) by mouth 2 (two) times daily as needed for up to 14 days for mild pain.   rosuvastatin 5 MG tablet Commonly known as: CRESTOR Take 1 tablet by mouth 3 (three) times a week. Monday Wednesday Friday            Durable Medical Equipment  (From admission, onward)         Start     Ordered   10/24/20 0709  For home use only DME Walker rolling  Once       Question Answer Comment  Walker: With [place] Wheels   Patient needs a  walker to treat with the following condition Gait disorder      10/24/20 0708   10/23/20 1757  For home use only DME Walker rolling  Once       Question Answer Comment  Walker: With 5 Inch Wheels   Patient needs a walker to treat with the following condition Gait abnormality      10/23/20 [facility] Follow up.   Why: [facility] Contact information: [address] [place] [address] [phone]        Josetta Huddle, MD. Schedule an appointment as soon as possible for a visit in 1 week(s).   Specialty: Internal Medicine Contact information: [address] [place] [address] [phone]              Allergies  Allergen Reactions  . Tetracycline  Other reaction(s): Other (See Comments), Unknown Lips swelled   . Codeine     Other reaction(s): Unknown    Consultations:  None   Procedures/Studies: DG Chest 1 View  Result Date: 10/22/2020 CLINICAL DATA:  Left chest pain EXAM: CHEST  1 VIEW COMPARISON:  10/11/2020 FINDINGS: Thoracic spondylosis. Mildly low lung volumes. Chronic fine reticular interstitial accentuation in the lung bases. No pneumothorax or discrete airspace opacity identified. IMPRESSION: 1. A specific cause for left chest pain is not identified. 2. Stable fine reticular accentuation in the lung bases. 3. Thoracic spondylosis. Electronically Signed   By: Van Clines M.D.   On: 10/22/2020 19:44   DG Ribs Unilateral W/Chest Left  Result Date: 10/11/2020 CLINICAL DATA:  Left rib pain after fall EXAM: LEFT RIBS AND CHEST - 3+ VIEW COMPARISON:  10/02/2020 chest radiograph. FINDINGS: Stable cardiomediastinal silhouette with normal heart size. No pneumothorax. No pleural effusion. No pulmonary edema. Mild reticular opacities at both lung bases. The area of symptomatic concern as indicated by the patient in the upper left chest was denoted with a  metallic skin BB by the technologist. No fracture or focal osseous lesion detected in the left ribs. IMPRESSION: No left rib fracture detected. Should the patient's symptoms persist or worsen, repeat radiographs of the ribs in 10 - 14 days maybe of use to detect subtle nondisplaced rib fractures (which are commonly occult on initial imaging). No pneumothorax. Mild reticular opacities at both lung bases, which could represent mild atelectasis or nonspecific mild fibrosis. Electronically Signed   By: Ilona Sorrel M.D.   On: 10/11/2020 11:47   DG Ribs Unilateral W/Chest Left  Result Date: 10/02/2020 CLINICAL DATA:  Fall EXAM: LEFT RIBS AND CHEST - 3+ VIEW COMPARISON:  None. FINDINGS: No fracture or other bone lesions are seen involving the ribs. There is no evidence of pneumothorax or pleural effusion. Coarse interstitial opacities are seen in both lungs. Mild pulmonary vascular congestion as well. The aorta is calcified. The remaining cardiomediastinal contours are unremarkable for portable technique. IMPRESSION: 1. No acute rib fracture or other acute osseous abnormality of the chest wall. 2. Coarse interstitial opacities in both lungs could reflect interstitial edema, infection or chronic interstitial disease in the absence of comparison. 3. Mild pulmonary vascular congestion. Electronically Signed   By: Lovena Le M.D.   On: 10/02/2020 20:27   CT Head Wo Contrast  Result Date: 10/02/2020 CLINICAL DATA:  Fall with head trauma EXAM: CT HEAD WITHOUT CONTRAST CT MAXILLOFACIAL WITHOUT CONTRAST CT CERVICAL SPINE WITHOUT CONTRAST TECHNIQUE: Multidetector CT imaging of the head, cervical spine, and maxillofacial structures were performed using the standard protocol without intravenous contrast. Multiplanar CT image reconstructions of the cervical spine and maxillofacial structures were also generated. COMPARISON:  None. FINDINGS: CT HEAD FINDINGS Brain: No intracranial hemorrhage or extra-axial collection. No mass  effect. Generalized volume loss with mild white matter hypoattenuation. Vascular: Small amount of atherosclerotic calcification of the internal carotid arteries at skull base. Skull: Left frontal scalp abrasion.  No skull fracture. Other: None CT MAXILLOFACIAL FINDINGS Osseous: No fracture.  No mandibular dislocation. Orbits: Normal Sinuses: Mild mucosal thickening in the left maxillary sinus. Soft tissues: Small left facial hematoma. CT CERVICAL SPINE FINDINGS Alignment: Normal Skull base and vertebrae: No fracture Soft tissues and spinal canal: Spinal canal is patent. Disc levels: Severe right C4 foraminal stenosis due to uncovertebral and facet hypertrophy. Upper chest: Negative Other: None IMPRESSION: 1. No acute intracranial abnormality. 2. Left frontal scalp abrasion and small left  facial hematoma without skull fracture. 3. No acute fracture or static subluxation of the cervical spine. 4. Severe right C4 foraminal stenosis due to uncovertebral and facet hypertrophy. Electronically Signed   By: Ulyses Jarred M.D.   On: 10/02/2020 20:40   CT Angio Chest PE W/Cm &/Or Wo Cm  Result Date: 10/22/2020 CLINICAL DATA:  Chest pain and weakness EXAM: CT ANGIOGRAPHY CHEST WITH CONTRAST TECHNIQUE: Multidetector CT imaging of the chest was performed using the standard protocol during bolus administration of intravenous contrast. Multiplanar CT image reconstructions and MIPs were obtained to evaluate the vascular anatomy. CONTRAST:  144mL OMNIPAQUE IOHEXOL 350 MG/ML SOLN COMPARISON:  None. FINDINGS: Cardiovascular: Contrast injection is sufficient to demonstrate satisfactory opacification of the pulmonary arteries to the segmental level. There is no pulmonary embolus or evidence of right heart strain. The size of the main pulmonary artery is normal. Heart size is normal, with no pericardial effusion. The course and caliber of the aorta are normal. There is atherosclerotic calcification. No acute aortic syndrome.  Mediastinum/Nodes: No mediastinal, hilar or axillary lymphadenopathy. Normal visualized thyroid. Thoracic esophageal course is normal. Lungs/Pleura: Bilateral dependent atelectasis. Right apical nodule measures 6 mm (7:73). Upper Abdomen: Contrast bolus timing is not optimized for evaluation of the abdominal organs. The visualized portions of the organs of the upper abdomen are normal. Musculoskeletal: No chest wall abnormality. No bony spinal canal stenosis. Review of the MIP images confirms the above findings. IMPRESSION: 1. No pulmonary embolus or other acute thoracic abnormality. 2. 6 mm right apical pulmonary nodule. Non-contrast chest CT at 6-12 months is recommended. If the nodule is stable at time of repeat CT, then future CT at 18-24 months (from today's scan) is considered optional for low-risk patients, but is recommended for high-risk patients. This recommendation follows the consensus statement: Guidelines for Management of Incidental Pulmonary Nodules Detected on CT Images: From the Fleischner Society 2017; Radiology 2017; 284:228-243. Aortic atherosclerosis (ICD10-I70.0). Electronically Signed   By: Ulyses Jarred M.D.   On: 10/22/2020 21:40   CT Cervical Spine Wo Contrast  Result Date: 10/02/2020 CLINICAL DATA:  Fall with head trauma EXAM: CT HEAD WITHOUT CONTRAST CT MAXILLOFACIAL WITHOUT CONTRAST CT CERVICAL SPINE WITHOUT CONTRAST TECHNIQUE: Multidetector CT imaging of the head, cervical spine, and maxillofacial structures were performed using the standard protocol without intravenous contrast. Multiplanar CT image reconstructions of the cervical spine and maxillofacial structures were also generated. COMPARISON:  None. FINDINGS: CT HEAD FINDINGS Brain: No intracranial hemorrhage or extra-axial collection. No mass effect. Generalized volume loss with mild white matter hypoattenuation. Vascular: Small amount of atherosclerotic calcification of the internal carotid arteries at skull base. Skull:  Left frontal scalp abrasion.  No skull fracture. Other: None CT MAXILLOFACIAL FINDINGS Osseous: No fracture.  No mandibular dislocation. Orbits: Normal Sinuses: Mild mucosal thickening in the left maxillary sinus. Soft tissues: Small left facial hematoma. CT CERVICAL SPINE FINDINGS Alignment: Normal Skull base and vertebrae: No fracture Soft tissues and spinal canal: Spinal canal is patent. Disc levels: Severe right C4 foraminal stenosis due to uncovertebral and facet hypertrophy. Upper chest: Negative Other: None IMPRESSION: 1. No acute intracranial abnormality. 2. Left frontal scalp abrasion and small left facial hematoma without skull fracture. 3. No acute fracture or static subluxation of the cervical spine. 4. Severe right C4 foraminal stenosis due to uncovertebral and facet hypertrophy. Electronically Signed   By: Ulyses Jarred M.D.   On: 10/02/2020 20:40   DG Knee Complete 4 Views Left  Result Date: 10/02/2020 CLINICAL DATA:  Fall, multiple lacerations and bruising EXAM: LEFT KNEE - COMPLETE 4+ VIEW COMPARISON:  None. FINDINGS: Soft tissue swelling and thickening is seen anteriorly. Correlate for contusive change in at most minimal prepatellar bursitis. No sizeable joint effusion. No acute bony abnormality. Specifically, no fracture, subluxation, or dislocation. Bony excrescence along the superomedial aspect of the medial femoral condyle may be on the spectrum of a Pellegrini-Stieda lesion which could implicate a remote MCL injury. Enthesopathic changes are noted along the extensor mechanism with superior inferior anterior patellar spurring. Corticated os ossific fragment anteroinferior to the patella may reflect a fragmented osteophyte or sequela of prior trauma, unlikely to be acute. Vascular calcium in the soft tissues. IMPRESSION: 1. Soft tissue swelling and thickening anteriorly. Correlate for contusive change and/or trace prepatellar bursitis. 2. No acute osseous abnormality. 3. Bony excrescence  along the medial femoral condyle, possibly Pellegrini-Stieda lesion which could implicate a remote MCL injury. 4. Tricompartmental degenerative changes and enthesopathy along the extensor mechanism. 5. Corticated fragment near the anteroinferior patella likely degenerative or remote traumatic. Electronically Signed   By: Lovena Le M.D.   On: 10/02/2020 20:21   CT Maxillofacial Wo Contrast  Result Date: 10/02/2020 CLINICAL DATA:  Fall with head trauma EXAM: CT HEAD WITHOUT CONTRAST CT MAXILLOFACIAL WITHOUT CONTRAST CT CERVICAL SPINE WITHOUT CONTRAST TECHNIQUE: Multidetector CT imaging of the head, cervical spine, and maxillofacial structures were performed using the standard protocol without intravenous contrast. Multiplanar CT image reconstructions of the cervical spine and maxillofacial structures were also generated. COMPARISON:  None. FINDINGS: CT HEAD FINDINGS Brain: No intracranial hemorrhage or extra-axial collection. No mass effect. Generalized volume loss with mild white matter hypoattenuation. Vascular: Small amount of atherosclerotic calcification of the internal carotid arteries at skull base. Skull: Left frontal scalp abrasion.  No skull fracture. Other: None CT MAXILLOFACIAL FINDINGS Osseous: No fracture.  No mandibular dislocation. Orbits: Normal Sinuses: Mild mucosal thickening in the left maxillary sinus. Soft tissues: Small left facial hematoma. CT CERVICAL SPINE FINDINGS Alignment: Normal Skull base and vertebrae: No fracture Soft tissues and spinal canal: Spinal canal is patent. Disc levels: Severe right C4 foraminal stenosis due to uncovertebral and facet hypertrophy. Upper chest: Negative Other: None IMPRESSION: 1. No acute intracranial abnormality. 2. Left frontal scalp abrasion and small left facial hematoma without skull fracture. 3. No acute fracture or static subluxation of the cervical spine. 4. Severe right C4 foraminal stenosis due to uncovertebral and facet hypertrophy.  Electronically Signed   By: Ulyses Jarred M.D.   On: 10/02/2020 20:40      Subjective: Patient seen and examined bedside, resting comfortably.  States ready for [facility].  Discussed with him needs close follow-up with his PCP and to continue antibiotics for his urinary tract infection.  No other questions or concerns at this time.  Denies headache, no fever/chills/night sweats, no nausea/vomiting/diarrhea, no chest pain/palpitations, no abdominal pain, no weakness, no fatigue, no paresthesias.  No acute events overnight per nursing staff.  Discharge Exam: Vitals:   10/23/20 2030 10/24/20 0605  BP: 131/66 126/73  Pulse: 70 70  Resp: 18 16  Temp: 98.2 F (36.8 C) 98.4 F (36.9 C)  SpO2: 99% 99%   Vitals:   10/23/20 0535 10/23/20 1253 10/23/20 2030 10/24/20 0605  BP: 127/60 115/60 131/66 126/73  Pulse: 73 65 70 70  Resp: 15 18 18 16   Temp: 98.2 F (36.8 C) 97.7 F (36.5 C) 98.2 F (36.8 C) 98.4 F (36.9 C)  TempSrc: Oral Oral Oral  Oral  SpO2: 99% 100% 99% 99%  Weight:      Height:        General: Pt is alert, awake, not in acute distress, hard of hearing with hearing aids in place, elderly in appearance Cardiovascular: RRR, S1/S2 +, no rubs, no gallops Respiratory: CTA bilaterally, no wheezing, no rhonchi, on room air Abdominal: Soft, NT, ND, bowel sounds + Extremities: no edema, no cyanosis    The results of significant diagnostics from this hospitalization (including imaging, microbiology, ancillary and laboratory) are listed below for reference.     Microbiology: Recent Results (from the past 240 hour(s))  Urine culture     Status: Abnormal (Preliminary result)   Collection Time: 10/22/20  8:12 PM   Specimen: Urine, Clean Catch  Result Value Ref Range Status   Specimen Description   Final    Urine Performed at [facility]    Special Requests NONE Performed at [facility]   Final   Culture (A)   Final    >=100,000 COLONIES/mL GRAM NEGATIVE RODS SUSCEPTIBILITIES TO FOLLOW Performed at [facility], [place] [address], [place], [place] [address]    Report Status PENDING  Incomplete  Resp Panel by RT-PCR (Flu A&B, Covid) Nasopharyngeal Swab     Status: None   Collection Time: 10/22/20 11:55 PM   Specimen: Nasopharyngeal Swab; Nasopharyngeal(NP) swabs in vial transport medium  Result Value Ref Range Status   SARS Coronavirus 2 by RT PCR NEGATIVE NEGATIVE Final    Comment: (NOTE) SARS-CoV-2 target nucleic acids are NOT DETECTED.  The SARS-CoV-2 RNA is generally detectable in upper respiratory specimens during the acute phase of infection. The lowest concentration of SARS-CoV-2 viral copies this assay can detect is 138 copies/mL. A negative result does not preclude SARS-Cov-2 infection and should not be used as the sole basis for treatment or other patient management decisions. A negative result may occur with  improper specimen collection/handling, submission of specimen other than nasopharyngeal swab, presence of viral mutation(s) within the areas targeted by this assay, and inadequate number of viral copies(<138 copies/mL). A negative result must be combined with clinical observations, patient history, and epidemiological information. The expected result is Negative.  Fact Sheet for Patients:  EntrepreneurPulse.com.au  Fact Sheet for Healthcare Providers:  IncredibleEmployment.be  This test is no t yet approved or cleared by the [place] FDA and  has been authorized for detection and/or diagnosis of SARS-CoV-2 by FDA under an Emergency Use Authorization (EUA). This EUA will remain  in effect (meaning this test can be used) for the duration of the COVID-19 declaration under Section 564(b)(1) of the Act, 21 U.S.C.section 360bbb-3(b)(1), unless the authorization is terminated  or revoked sooner.       Influenza A by PCR NEGATIVE  NEGATIVE Final   Influenza B by PCR NEGATIVE NEGATIVE Final    Comment: (NOTE) The Xpert Xpress SARS-CoV-2/FLU/RSV plus assay is intended as an aid in the diagnosis of influenza from Nasopharyngeal swab specimens and should not be used as a sole basis for treatment. Nasal washings and aspirates are unacceptable for Xpert Xpress SARS-CoV-2/FLU/RSV testing.  Fact Sheet for Patients: EntrepreneurPulse.com.au  Fact Sheet for Healthcare Providers: IncredibleEmployment.be  This test is not yet approved or cleared by the [place] FDA and has been authorized for detection and/or diagnosis of SARS-CoV-2 by FDA under an Emergency Use Authorization (EUA). This EUA will remain in effect (meaning this test can be used) for the duration of the COVID-19  declaration under Section 564(b)(1) of the Act, 21 U.S.C. section 360bbb-3(b)(1), unless the authorization is terminated or revoked.  Performed at [facility]      Labs: BNP (last 3 results) No results for input(s): BNP in the last 8760 hours. Basic Metabolic Panel: Recent Labs  Lab 10/22/20 1851 10/23/20 0523 10/24/20 0509  NA 131* 134* 131*  K 3.9 4.1 4.3  CL 103 103 100  CO2 20* 24 23  GLUCOSE 84 119* 105*  BUN 14 12 12   CREATININE 0.76 0.91 0.72  CALCIUM 7.2* 8.5* 8.3*  MG  --   --  1.9   Liver Function Tests: No results for input(s): AST, ALT, ALKPHOS, BILITOT, PROT, ALBUMIN in the last 168 hours. No results for input(s): LIPASE, AMYLASE in the last 168 hours. No results for input(s): AMMONIA in the last 168 hours. CBC: Recent Labs  Lab 10/22/20 1851 10/23/20 0523 10/24/20 0509  WBC 14.8* 21.7* 12.8*  HGB 12.2* 13.0 13.0  HCT 34.6* 38.9* 38.2*  MCV 94.0 98.2 96.2  PLT 151 150 165   Cardiac Enzymes: No results for input(s): CKTOTAL, CKMB, CKMBINDEX, TROPONINI in the last 168 hours. BNP: Invalid input(s): POCBNP CBG: No results for input(s): GLUCAP in the  last 168 hours. D-Dimer Recent Labs    10/22/20 1851  DDIMER 3.33*   Hgb A1c No results for input(s): HGBA1C in the last 72 hours. Lipid Profile No results for input(s): CHOL, HDL, LDLCALC, TRIG, CHOLHDL, LDLDIRECT in the last 72 hours. Thyroid function studies No results for input(s): TSH, T4TOTAL, T3FREE, THYROIDAB in the last 72 hours.  Invalid input(s): FREET3 Anemia work up No results for input(s): VITAMINB12, FOLATE, FERRITIN, TIBC, IRON, RETICCTPCT in the last 72 hours. Urinalysis    Component Value Date/Time   COLORURINE YELLOW 10/22/2020 2012   APPEARANCEUR HAZY (A) 10/22/2020 2012   LABSPEC 1.012 10/22/2020 2012   PHURINE 6.5 10/22/2020 2012   GLUCOSEU NEGATIVE 10/22/2020 2012   HGBUR SMALL (A) 10/22/2020 2012   [place] NEGATIVE 10/22/2020 2012   KETONESUR NEGATIVE 10/22/2020 2012   PROTEINUR 30 (A) 10/22/2020 2012   UROBILINOGEN 1.0 05/08/2010 2151   NITRITE NEGATIVE 10/22/2020 2012   LEUKOCYTESUR LARGE (A) 10/22/2020 2012   Sepsis Labs Invalid input(s): PROCALCITONIN,  WBC,  LACTICIDVEN Microbiology Recent Results (from the past 240 hour(s))  Urine culture     Status: Abnormal (Preliminary result)   Collection Time: 10/22/20  8:12 PM   Specimen: Urine, Clean Catch  Result Value Ref Range Status   Specimen Description   Final    Urine Performed at [facility]    Special Requests NONE Performed at [facility]   Final   Culture (A)  Final    >=100,000 COLONIES/mL GRAM NEGATIVE RODS SUSCEPTIBILITIES TO FOLLOW Performed at [facility], [place] [address], [place], [place] [address]    Report Status PENDING  Incomplete  Resp Panel by RT-PCR (Flu A&B, Covid) Nasopharyngeal Swab     Status: None   Collection Time: 10/22/20 11:55 PM   Specimen: Nasopharyngeal Swab; Nasopharyngeal(NP) swabs in vial transport medium  Result Value Ref Range Status   SARS Coronavirus 2 by RT PCR NEGATIVE NEGATIVE Final    Comment:  (NOTE) SARS-CoV-2 target nucleic acids are NOT DETECTED.  The SARS-CoV-2 RNA is generally detectable in upper respiratory specimens during the acute phase of infection. The lowest concentration of SARS-CoV-2 viral copies this assay can detect is 138 copies/mL. A negative result does not  preclude SARS-Cov-2 infection and should not be used as the sole basis for treatment or other patient management decisions. A negative result may occur with  improper specimen collection/handling, submission of specimen other than nasopharyngeal swab, presence of viral mutation(s) within the areas targeted by this assay, and inadequate number of viral copies(<138 copies/mL). A negative result must be combined with clinical observations, patient history, and epidemiological information. The expected result is Negative.  Fact Sheet for Patients:  EntrepreneurPulse.com.au  Fact Sheet for Healthcare Providers:  IncredibleEmployment.be  This test is no t yet approved or cleared by the [place] FDA and  has been authorized for detection and/or diagnosis of SARS-CoV-2 by FDA under an Emergency Use Authorization (EUA). This EUA will remain  in effect (meaning this test can be used) for the duration of the COVID-19 declaration under Section 564(b)(1) of the Act, 21 U.S.C.section 360bbb-3(b)(1), unless the authorization is terminated  or revoked sooner.       Influenza A by PCR NEGATIVE NEGATIVE Final   Influenza B by PCR NEGATIVE NEGATIVE Final    Comment: (NOTE) The Xpert Xpress SARS-CoV-2/FLU/RSV plus assay is intended as an aid in the diagnosis of influenza from Nasopharyngeal swab specimens and should not be used as a sole basis for treatment. Nasal washings and aspirates are unacceptable for Xpert Xpress SARS-CoV-2/FLU/RSV testing.  Fact Sheet for Patients: EntrepreneurPulse.com.au  Fact Sheet for Healthcare  Providers: IncredibleEmployment.be  This test is not yet approved or cleared by the [place] FDA and has been authorized for detection and/or diagnosis of SARS-CoV-2 by FDA under an Emergency Use Authorization (EUA). This EUA will remain in effect (meaning this test can be used) for the duration of the COVID-19 declaration under Section 564(b)(1) of the Act, 21 U.S.C. section 360bbb-3(b)(1), unless the authorization is terminated or revoked.  Performed at [facility]      Time coordinating discharge: Over 30 minutes  SIGNED:   Delois Silvester J [place] DO  [facility] 10/24/2020, 9:34 AM

## 2020-10-24 NOTE — Discharge Instructions (Signed)
Urinary Tract Infection, Adult A urinary tract infection (UTI) is an infection of any part of the urinary tract. The urinary tract includes:  The kidneys.  The ureters.  The bladder.  The urethra. These organs make, store, and get rid of pee (urine) in the body. What are the causes? This infection is caused by germs (bacteria) in your genital area. These germs grow and cause swelling (inflammation) of your urinary tract. What increases the risk? The following factors may make you more likely to develop this condition:  Using a small, thin tube (catheter) to drain pee.  Not being able to control when you pee or poop (incontinence).  Being male. If you are male, these things can increase the risk: ? Using these methods to prevent pregnancy:  A medicine that kills sperm (spermicide).  A device that blocks sperm (diaphragm). ? Having low levels of a male hormone (estrogen). ? Being pregnant. You are more likely to develop this condition if:  You have genes that add to your risk.  You are sexually active.  You take antibiotic medicines.  You have trouble peeing because of: ? A prostate that is bigger than normal, if you are male. ? A blockage in the part of your body that drains pee from the bladder. ? A kidney stone. ? A nerve condition that affects your bladder. ? Not getting enough to drink. ? Not peeing often enough.  You have other conditions, such as: ? Diabetes. ? A weak disease-fighting system (immune system). ? Sickle cell disease. ? Gout. ? Injury of the spine. What are the signs or symptoms? Symptoms of this condition include:  Needing to pee right away.  Peeing small amounts often.  Pain or burning when peeing.  Blood in the pee.  Pee that smells bad or not like normal.  Trouble peeing.  Pee that is cloudy.  Fluid coming from the vagina, if you are male.  Pain in the belly or lower back. Other symptoms include:  Vomiting.  Not  feeling hungry.  Feeling mixed up (confused). This may be the first symptom in older adults.  Being tired and grouchy (irritable).  A fever.  Watery poop (diarrhea). How is this treated?  Taking antibiotic medicine.  Taking other medicines.  Drinking enough water. In some cases, you may need to see a specialist. Follow these instructions at home: Medicines  Take over-the-counter and prescription medicines only as told by your doctor.  If you were prescribed an antibiotic medicine, take it as told by your doctor. Do not stop taking it even if you start to feel better. General instructions  Make sure you: ? Pee until your bladder is empty. ? Do not hold pee for a long time. ? Empty your bladder after sex. ? Wipe from front to back after peeing or pooping if you are a male. Use each tissue one time when you wipe.  Drink enough fluid to keep your pee pale yellow.  Keep all follow-up visits.   Contact a doctor if:  You do not get better after 1-2 days.  Your symptoms go away and then come back. Get help right away if:  You have very bad back pain.  You have very bad pain in your lower belly.  You have a fever.  You have chills.  You feeling like you will vomit or you vomit. Summary  A urinary tract infection (UTI) is an infection of any part of the urinary tract.  This condition is caused by   germs in your genital area.  There are many risk factors for a UTI.  Treatment includes antibiotic medicines.  Drink enough fluid to keep your pee pale yellow. This information is not intended to replace advice given to you by your health care provider. Make sure you discuss any questions you have with your health care provider. Document Revised: 02/23/2020 Document Reviewed: 02/23/2020 Elsevier Patient Education  2021 [place].  Musculoskeletal Pain Musculoskeletal pain refers to aches and pains in your bones, joints, muscles, and the tissues that surround  them. This pain can occur in any part of the body. It can last for a short time (acute) or a long time (chronic). A physical exam, lab tests, and imaging studies may be done to find the cause of your musculoskeletal pain. Follow these instructions at home: Lifestyle  Try to control or lower your stress levels. Stress increases muscle tension and can worsen musculoskeletal pain. It is important to recognize when you are anxious or stressed and learn ways to manage it. This may include: ? Meditation or yoga. ? Cognitive or behavioral therapy. ? Acupuncture or massage therapy.  You may continue all activities unless the activities cause more pain. When the pain gets better, slowly resume your normal activities. Gradually increase the intensity and duration of your activities or exercise. Managing pain, stiffness, and swelling  Treatment may include medicines for pain and inflammation that are taken by mouth or applied to the skin. Take over-the-counter and prescription medicines only as told by your health care provider.  When your pain is severe, bed rest may be helpful. Lie or sit in any position that is comfortable, but get out of bed and walk around at least every couple of hours.  If directed, apply heat to the affected area as often as told by your health care provider. Use the heat source that your health care provider recommends, such as a moist heat pack or a heating pad. ? Place a towel between your skin and the heat source. ? Leave the heat on for 20-30 minutes. ? Remove the heat if your skin turns bright red. This is especially important if you are unable to feel pain, heat, or cold. You may have a greater risk of getting burned.  If directed, put ice on the painful area. To do this: ? Put ice in a plastic bag. ? Place a towel between your skin and the bag. ? Leave the ice on for 20 minutes, 2-3 times a day. ? Remove the ice if your skin turns bright red. This is very important. If  you cannot feel pain, heat, or cold, you have a greater risk of damage to the area.      General instructions  Your health care provider may recommend that you see a physical therapist. This person can help you come up with a safe exercise program.  If told by your health care provider, do physical therapy exercises to improve movement and strength in the affected area.  Keep all follow-up visits. This is important. This includes any physical therapy visits. Contact a health care provider if:  Your pain gets worse.  Medicines do not help ease your pain.  You cannot use the part of your body that hurts, such as your arm, leg, or neck.  You have trouble sleeping.  You have trouble doing your normal activities. Get help right away if:  You have a new injury and your pain is worse or different.  You feel numb  or you have tingling in the painful area. Summary  Musculoskeletal pain refers to aches and pains in your bones, joints, muscles, and the tissues that surround them.  This pain can occur in any part of the body.  Your health care provider may recommend that you see a physical therapist. This person can help you come up with a safe exercise program. Do any exercises as told by your physical therapist.  Lower your stress level. Stress can worsen musculoskeletal pain. Ways to lower stress may include meditation, yoga, cognitive or behavioral therapy, acupuncture, and massage therapy. This information is not intended to replace advice given to you by your health care provider. Make sure you discuss any questions you have with your health care provider. Document Revised: 11/16/2019 Document Reviewed: 10/25/2019 Elsevier Patient Education  2021 [employer].

## 2020-10-24 NOTE — Evaluation (Signed)
Occupational Therapy Evaluation Patient Details Name: Adrian Ray MRN: [id number] DOB: 07-18-1934 Today's Date: 10/24/2020    History of Present Illness 87 year old male with past medical history significant for hyperlipidemia, GERD, cataracts, hard of hearing who presented as a transfer from [place] complaining of left-sided chest pain.  He reports fell 3 weeks ago and was seen at [facility], ED.  X-rays were negative and patient was subsequently discharged home.  Pt admitted for UTI and atypical chest pain   Clinical Impression   [name] is an 87 year old man normally independent and active at home admitted to hospital with chest pain. On evaluation patient demonstrates normal upper body strength, ability to perform ADLs and ambulate in hall without a device. Patient had no overt loss of balance though used hand rail intermittently with ambulation. No OT needs at this time.    Follow Up Recommendations  No OT follow up    Equipment Recommendations  None recommended by OT    Recommendations for Other Services       Precautions / Restrictions Precautions Precautions: Fall Precaution Comments: HOH Restrictions Weight Bearing Restrictions: No      Mobility Bed Mobility               General bed mobility comments: OOB    Transfers Overall transfer level: Needs assistance Equipment used: None Transfers: Sit to/from Stand Sit to Stand: Supervision         General transfer comment: Ambulated in hall  approx 400 feet with supervision from therapist. Intermittently uses hand rail at times but no overt loss of balance.    Balance Overall balance assessment: No apparent balance deficits (not formally assessed)                                         ADL either performed or assessed with clinical judgement   ADL Overall ADL's : Modified independent                                       General ADL Comments:  Increased time to perform ADLs - otherwise independent.     Vision Patient Visual Report: No change from baseline Vision Assessment?: No apparent visual deficits     Perception     Praxis      Pertinent Vitals/Pain Pain Assessment: No/denies pain     Hand Dominance     Extremity/Trunk Assessment Upper Extremity Assessment Upper Extremity Assessment: Overall WFL for tasks assessed   Lower Extremity Assessment Lower Extremity Assessment: Defer to PT evaluation   Cervical / Trunk Assessment Cervical / Trunk Assessment:  (cervical kyphosis)   Communication Communication Communication: HOH (HOH despite hearing aid in place)   Cognition Arousal/Alertness: Awake/alert Behavior During Therapy: WFL for tasks assessed/performed Overall Cognitive Status: Within Functional Limits for tasks assessed                                     General Comments       Exercises     Shoulder Instructions      Home Living Family/patient expects to be discharged to:: Assisted living  Home Equipment: [place] - 2 wheels          Prior Functioning/Environment Level of Independence: Independent        Comments: pt reports he lives at [facility] in his own apartment; spouse is also at facility however in SNF section. Reports he is active - walks, golfs, swims.        OT Problem List:        OT Treatment/Interventions:      OT Goals(Current goals can be found in the care plan section) Acute Rehab OT Goals OT Goal Formulation: All assessment and education complete, DC therapy  OT Frequency:     Barriers to D/C:            Co-evaluation              AM-PAC OT "6 Clicks" Daily Activity     Outcome Measure Help from another person eating meals?: None Help from another person taking care of personal grooming?: None Help from another person toileting, which includes using toliet, bedpan, or urinal?: None Help from  another person bathing (including washing, rinsing, drying)?: None Help from another person to put on and taking off regular upper body clothing?: None Help from another person to put on and taking off regular lower body clothing?: None 6 Click Score: 24   End of Session Nurse Communication: Mobility status  Activity Tolerance: Patient tolerated treatment well Patient left: in chair;with call bell/phone within reach  OT Visit Diagnosis: History of falling (Z91.81)                Time: 0071-2197 OT Time Calculation (min): 18 min Charges:  OT General Charges $OT Visit: 1 Visit OT Evaluation $OT Eval Low Complexity: 1 Low  Adrian Ray, OTR/L [place]  Office [phone] Pager: [phone]   Adrian Ray 10/24/2020, 10:06 AM

## 2020-10-24 NOTE — TOC Transition Note (Addendum)
Transition of Care [facility]) - CM/SW Discharge Note   Patient Details  Name: Adrian Ray MRN: [id number] Date of Birth: [birth year]  [facility] CM/SW Contact:  Dessa Phi, RN Phone Number: 10/24/2020, 11:37 AM   Clinical Narrative:Spoke to [facility]/spouse/nsg-patient d/c plan to return back to [facility] was ordered for rw-but per patient has a rw @ home. HHPT ordered-facility will provide own HHPT-[facility]-faxed orders with confirmation-rep Butch Penny aware. [facility] has own transportation will call them for pick up. Forms in printer-nsg aware. No further CM needs.  TC [facility] spoke to Delana-informed her of address here @ [facility] main entrance for pick up, & tel# to floor once arrival-time fram between 12p-2p for pick up.    Final next level of care: [place] Barriers to Discharge: No Barriers Identified   Patient Goals and CMS Choice Patient states their goals for this hospitalization and ongoing recovery are:: return back to [facility] CMS Medicare.gov Compare Post Acute Care list provided to:: Patient Represenative (must comment)    Discharge Placement                       Discharge Plan and Services   Discharge Planning Services: CM Consult                      HH Arranged: PT HH Agency:  ([facility] has their own HHPT.) Date HH Agency Contacted: 10/23/20 Time [place]: [place] Representative spoke with at [place]: [place] ([place]) Interventions     Readmission Risk Interventions No flowsheet data found.

## 2020-10-25 LAB — URINE CULTURE: Culture: >=100000 — AB

## 2020-10-31 DIAGNOSIS — R079 Chest pain, unspecified: Secondary | ICD-10-CM | POA: Diagnosis not present

## 2020-10-31 DIAGNOSIS — N390 Urinary tract infection, site not specified: Secondary | ICD-10-CM | POA: Diagnosis not present

## 2020-11-06 DIAGNOSIS — N390 Urinary tract infection, site not specified: Secondary | ICD-10-CM | POA: Diagnosis not present

## 2020-12-05 DIAGNOSIS — N390 Urinary tract infection, site not specified: Secondary | ICD-10-CM | POA: Diagnosis not present

## 2020-12-05 DIAGNOSIS — K6289 Other specified diseases of anus and rectum: Secondary | ICD-10-CM | POA: Diagnosis not present

## 2020-12-05 DIAGNOSIS — H903 Sensorineural hearing loss, bilateral: Secondary | ICD-10-CM | POA: Diagnosis not present

## 2020-12-05 DIAGNOSIS — K5900 Constipation, unspecified: Secondary | ICD-10-CM | POA: Diagnosis not present

## 2020-12-24 DIAGNOSIS — N390 Urinary tract infection, site not specified: Secondary | ICD-10-CM | POA: Diagnosis not present

## 2021-01-06 DIAGNOSIS — N390 Urinary tract infection, site not specified: Secondary | ICD-10-CM | POA: Diagnosis not present

## 2021-02-12 DIAGNOSIS — Z85820 Personal history of malignant melanoma of skin: Secondary | ICD-10-CM | POA: Diagnosis not present

## 2021-02-12 DIAGNOSIS — Z85828 Personal history of other malignant neoplasm of skin: Secondary | ICD-10-CM | POA: Diagnosis not present

## 2021-02-12 DIAGNOSIS — D225 Melanocytic nevi of trunk: Secondary | ICD-10-CM | POA: Diagnosis not present

## 2021-02-12 DIAGNOSIS — L821 Other seborrheic keratosis: Secondary | ICD-10-CM | POA: Diagnosis not present

## 2021-02-12 DIAGNOSIS — L570 Actinic keratosis: Secondary | ICD-10-CM | POA: Diagnosis not present

## 2021-04-10 DIAGNOSIS — Z0000 Encounter for general adult medical examination without abnormal findings: Secondary | ICD-10-CM | POA: Diagnosis not present

## 2021-04-10 DIAGNOSIS — Z1389 Encounter for screening for other disorder: Secondary | ICD-10-CM | POA: Diagnosis not present

## 2021-04-10 DIAGNOSIS — K5730 Diverticulosis of large intestine without perforation or abscess without bleeding: Secondary | ICD-10-CM | POA: Diagnosis not present

## 2021-04-10 DIAGNOSIS — R351 Nocturia: Secondary | ICD-10-CM | POA: Diagnosis not present

## 2021-04-10 DIAGNOSIS — Z23 Encounter for immunization: Secondary | ICD-10-CM | POA: Diagnosis not present

## 2021-04-10 DIAGNOSIS — N390 Urinary tract infection, site not specified: Secondary | ICD-10-CM | POA: Diagnosis not present

## 2021-04-10 DIAGNOSIS — H903 Sensorineural hearing loss, bilateral: Secondary | ICD-10-CM | POA: Diagnosis not present

## 2021-04-10 DIAGNOSIS — E538 Deficiency of other specified B group vitamins: Secondary | ICD-10-CM | POA: Diagnosis not present

## 2021-04-10 DIAGNOSIS — R350 Frequency of micturition: Secondary | ICD-10-CM | POA: Diagnosis not present

## 2021-04-10 DIAGNOSIS — E785 Hyperlipidemia, unspecified: Secondary | ICD-10-CM | POA: Diagnosis not present

## 2021-04-21 DIAGNOSIS — L570 Actinic keratosis: Secondary | ICD-10-CM | POA: Diagnosis not present

## 2021-04-21 DIAGNOSIS — D485 Neoplasm of uncertain behavior of skin: Secondary | ICD-10-CM | POA: Diagnosis not present

## 2021-04-21 DIAGNOSIS — C44629 Squamous cell carcinoma of skin of left upper limb, including shoulder: Secondary | ICD-10-CM | POA: Diagnosis not present

## 2021-04-21 DIAGNOSIS — Z85828 Personal history of other malignant neoplasm of skin: Secondary | ICD-10-CM | POA: Diagnosis not present

## 2021-04-21 DIAGNOSIS — Z85820 Personal history of malignant melanoma of skin: Secondary | ICD-10-CM | POA: Diagnosis not present

## 2021-05-13 DIAGNOSIS — E785 Hyperlipidemia, unspecified: Secondary | ICD-10-CM | POA: Diagnosis not present

## 2021-05-13 DIAGNOSIS — E538 Deficiency of other specified B group vitamins: Secondary | ICD-10-CM | POA: Diagnosis not present

## 2021-05-13 DIAGNOSIS — H903 Sensorineural hearing loss, bilateral: Secondary | ICD-10-CM | POA: Diagnosis not present

## 2021-05-13 DIAGNOSIS — K5730 Diverticulosis of large intestine without perforation or abscess without bleeding: Secondary | ICD-10-CM | POA: Diagnosis not present

## 2021-05-13 DIAGNOSIS — N390 Urinary tract infection, site not specified: Secondary | ICD-10-CM | POA: Diagnosis not present

## 2021-05-13 DIAGNOSIS — R351 Nocturia: Secondary | ICD-10-CM | POA: Diagnosis not present

## 2021-05-13 DIAGNOSIS — R350 Frequency of micturition: Secondary | ICD-10-CM | POA: Diagnosis not present

## 2021-05-15 DIAGNOSIS — Z85820 Personal history of malignant melanoma of skin: Secondary | ICD-10-CM | POA: Diagnosis not present

## 2021-05-15 DIAGNOSIS — Z85828 Personal history of other malignant neoplasm of skin: Secondary | ICD-10-CM | POA: Diagnosis not present

## 2021-05-15 DIAGNOSIS — L821 Other seborrheic keratosis: Secondary | ICD-10-CM | POA: Diagnosis not present

## 2021-05-15 DIAGNOSIS — L853 Xerosis cutis: Secondary | ICD-10-CM | POA: Diagnosis not present

## 2021-05-15 DIAGNOSIS — L570 Actinic keratosis: Secondary | ICD-10-CM | POA: Diagnosis not present

## 2021-08-13 DIAGNOSIS — Z85828 Personal history of other malignant neoplasm of skin: Secondary | ICD-10-CM | POA: Diagnosis not present

## 2021-08-13 DIAGNOSIS — L570 Actinic keratosis: Secondary | ICD-10-CM | POA: Diagnosis not present

## 2021-08-13 DIAGNOSIS — D485 Neoplasm of uncertain behavior of skin: Secondary | ICD-10-CM | POA: Diagnosis not present

## 2021-08-13 DIAGNOSIS — L821 Other seborrheic keratosis: Secondary | ICD-10-CM | POA: Diagnosis not present

## 2021-08-13 DIAGNOSIS — Z85820 Personal history of malignant melanoma of skin: Secondary | ICD-10-CM | POA: Diagnosis not present

## 2021-08-13 DIAGNOSIS — C44622 Squamous cell carcinoma of skin of right upper limb, including shoulder: Secondary | ICD-10-CM | POA: Diagnosis not present

## 2021-08-18 DIAGNOSIS — R351 Nocturia: Secondary | ICD-10-CM | POA: Diagnosis not present

## 2021-08-18 DIAGNOSIS — E538 Deficiency of other specified B group vitamins: Secondary | ICD-10-CM | POA: Diagnosis not present

## 2021-08-18 DIAGNOSIS — H903 Sensorineural hearing loss, bilateral: Secondary | ICD-10-CM | POA: Diagnosis not present

## 2021-08-18 DIAGNOSIS — R350 Frequency of micturition: Secondary | ICD-10-CM | POA: Diagnosis not present

## 2021-08-18 DIAGNOSIS — K5730 Diverticulosis of large intestine without perforation or abscess without bleeding: Secondary | ICD-10-CM | POA: Diagnosis not present

## 2021-08-18 DIAGNOSIS — E785 Hyperlipidemia, unspecified: Secondary | ICD-10-CM | POA: Diagnosis not present

## 2021-08-18 DIAGNOSIS — N390 Urinary tract infection, site not specified: Secondary | ICD-10-CM | POA: Diagnosis not present

## 2021-10-06 DIAGNOSIS — H9313 Tinnitus, bilateral: Secondary | ICD-10-CM | POA: Diagnosis not present

## 2021-10-06 DIAGNOSIS — H903 Sensorineural hearing loss, bilateral: Secondary | ICD-10-CM | POA: Diagnosis not present

## 2021-10-06 DIAGNOSIS — Z77122 Contact with and (suspected) exposure to noise: Secondary | ICD-10-CM | POA: Diagnosis not present

## 2021-10-21 DIAGNOSIS — H2513 Age-related nuclear cataract, bilateral: Secondary | ICD-10-CM | POA: Diagnosis not present

## 2021-11-11 DIAGNOSIS — B351 Tinea unguium: Secondary | ICD-10-CM | POA: Diagnosis not present

## 2021-11-11 DIAGNOSIS — Q6689 Other  specified congenital deformities of feet: Secondary | ICD-10-CM | POA: Diagnosis not present

## 2021-11-11 DIAGNOSIS — L84 Corns and callosities: Secondary | ICD-10-CM | POA: Diagnosis not present

## 2021-11-12 DIAGNOSIS — L570 Actinic keratosis: Secondary | ICD-10-CM | POA: Diagnosis not present

## 2021-11-12 DIAGNOSIS — L821 Other seborrheic keratosis: Secondary | ICD-10-CM | POA: Diagnosis not present

## 2021-11-12 DIAGNOSIS — Z85820 Personal history of malignant melanoma of skin: Secondary | ICD-10-CM | POA: Diagnosis not present

## 2021-11-12 DIAGNOSIS — Z85828 Personal history of other malignant neoplasm of skin: Secondary | ICD-10-CM | POA: Diagnosis not present

## 2021-11-20 DIAGNOSIS — Z85828 Personal history of other malignant neoplasm of skin: Secondary | ICD-10-CM | POA: Diagnosis not present

## 2021-11-20 DIAGNOSIS — Z85820 Personal history of malignant melanoma of skin: Secondary | ICD-10-CM | POA: Diagnosis not present

## 2021-11-20 DIAGNOSIS — S70362A Insect bite (nonvenomous), left thigh, initial encounter: Secondary | ICD-10-CM | POA: Diagnosis not present

## 2022-01-05 DIAGNOSIS — K5730 Diverticulosis of large intestine without perforation or abscess without bleeding: Secondary | ICD-10-CM | POA: Diagnosis not present

## 2022-01-05 DIAGNOSIS — R351 Nocturia: Secondary | ICD-10-CM | POA: Diagnosis not present

## 2022-01-05 DIAGNOSIS — E785 Hyperlipidemia, unspecified: Secondary | ICD-10-CM | POA: Diagnosis not present

## 2022-01-05 DIAGNOSIS — H9190 Unspecified hearing loss, unspecified ear: Secondary | ICD-10-CM | POA: Diagnosis not present

## 2022-01-05 DIAGNOSIS — E538 Deficiency of other specified B group vitamins: Secondary | ICD-10-CM | POA: Diagnosis not present

## 2022-01-06 DIAGNOSIS — J342 Deviated nasal septum: Secondary | ICD-10-CM | POA: Diagnosis not present

## 2022-01-06 DIAGNOSIS — J343 Hypertrophy of nasal turbinates: Secondary | ICD-10-CM | POA: Diagnosis not present

## 2022-01-06 DIAGNOSIS — J310 Chronic rhinitis: Secondary | ICD-10-CM | POA: Diagnosis not present

## 2022-01-06 DIAGNOSIS — H903 Sensorineural hearing loss, bilateral: Secondary | ICD-10-CM | POA: Diagnosis not present

## 2022-01-06 DIAGNOSIS — H6123 Impacted cerumen, bilateral: Secondary | ICD-10-CM | POA: Diagnosis not present

## 2022-02-11 DIAGNOSIS — Z85828 Personal history of other malignant neoplasm of skin: Secondary | ICD-10-CM | POA: Diagnosis not present

## 2022-02-11 DIAGNOSIS — L821 Other seborrheic keratosis: Secondary | ICD-10-CM | POA: Diagnosis not present

## 2022-02-11 DIAGNOSIS — D485 Neoplasm of uncertain behavior of skin: Secondary | ICD-10-CM | POA: Diagnosis not present

## 2022-02-11 DIAGNOSIS — L570 Actinic keratosis: Secondary | ICD-10-CM | POA: Diagnosis not present

## 2022-02-11 DIAGNOSIS — Z85820 Personal history of malignant melanoma of skin: Secondary | ICD-10-CM | POA: Diagnosis not present

## 2022-02-23 DIAGNOSIS — M5451 Vertebrogenic low back pain: Secondary | ICD-10-CM | POA: Diagnosis not present

## 2022-02-23 DIAGNOSIS — M5136 Other intervertebral disc degeneration, lumbar region: Secondary | ICD-10-CM | POA: Diagnosis not present

## 2022-02-25 DIAGNOSIS — S39012D Strain of muscle, fascia and tendon of lower back, subsequent encounter: Secondary | ICD-10-CM | POA: Diagnosis not present

## 2022-02-25 DIAGNOSIS — M6281 Muscle weakness (generalized): Secondary | ICD-10-CM | POA: Diagnosis not present

## 2022-03-02 DIAGNOSIS — B070 Plantar wart: Secondary | ICD-10-CM | POA: Diagnosis not present

## 2022-03-02 DIAGNOSIS — Z85820 Personal history of malignant melanoma of skin: Secondary | ICD-10-CM | POA: Diagnosis not present

## 2022-03-02 DIAGNOSIS — Z85828 Personal history of other malignant neoplasm of skin: Secondary | ICD-10-CM | POA: Diagnosis not present

## 2022-03-02 DIAGNOSIS — M79672 Pain in left foot: Secondary | ICD-10-CM | POA: Diagnosis not present

## 2022-03-02 DIAGNOSIS — L84 Corns and callosities: Secondary | ICD-10-CM | POA: Diagnosis not present

## 2022-03-04 DIAGNOSIS — H903 Sensorineural hearing loss, bilateral: Secondary | ICD-10-CM | POA: Diagnosis not present

## 2022-03-04 DIAGNOSIS — Z974 Presence of external hearing-aid: Secondary | ICD-10-CM | POA: Diagnosis not present

## 2022-03-10 DIAGNOSIS — D485 Neoplasm of uncertain behavior of skin: Secondary | ICD-10-CM | POA: Diagnosis not present

## 2022-03-10 DIAGNOSIS — C44629 Squamous cell carcinoma of skin of left upper limb, including shoulder: Secondary | ICD-10-CM | POA: Diagnosis not present

## 2022-03-10 DIAGNOSIS — Z85828 Personal history of other malignant neoplasm of skin: Secondary | ICD-10-CM | POA: Diagnosis not present

## 2022-03-10 DIAGNOSIS — Z85820 Personal history of malignant melanoma of skin: Secondary | ICD-10-CM | POA: Diagnosis not present

## 2022-03-12 IMAGING — CT CT MAXILLOFACIAL W/O CM
3 series · 15 of 47 positions shown, 18 images · non-contrast
Comparison: None.

CLINICAL DATA: Fall with head trauma

EXAM:
CT HEAD WITHOUT CONTRAST
CT MAXILLOFACIAL WITHOUT CONTRAST
CT CERVICAL SPINE WITHOUT CONTRAST
TECHNIQUE: Multidetector CT imaging of the head, cervical spine, and
maxillofacial structures were performed using the standard protocol
without intravenous contrast. Multiplanar CT image reconstructions
of the cervical spine and maxillofacial structures were also
generated.

[Series 3: max soft · axial · 0.42mm/px · z∈[-336,-142]mm · 9 of 113 slices shown, 12 images]
[im 8/113  brain]
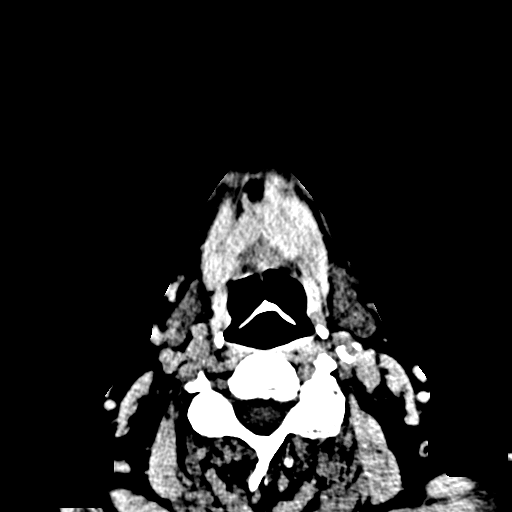
[im 8/113  bone]
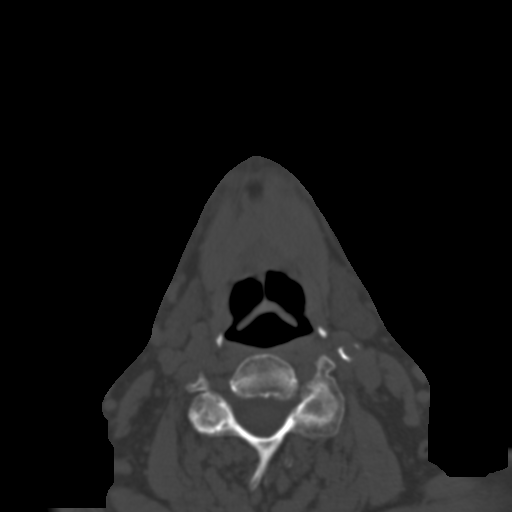
[im 20/113  bone]
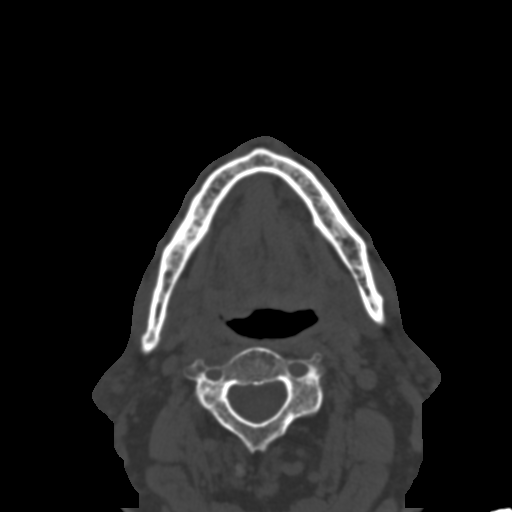
[im 31/113  bone]
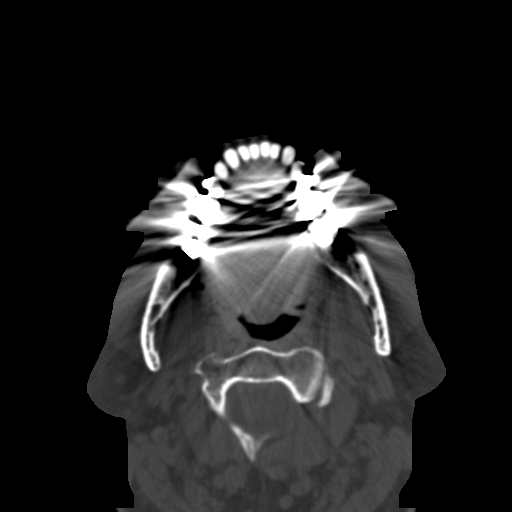
[im 43/113  bone]
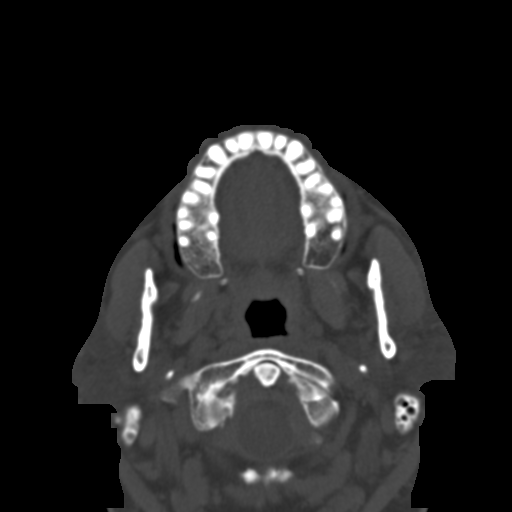
[im 58/113  brain]
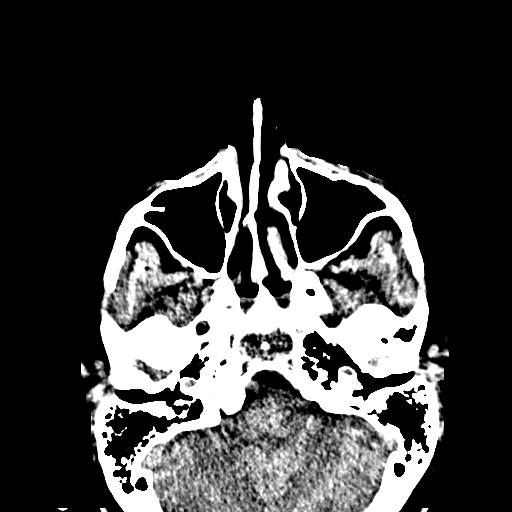
[im 58/113  bone]
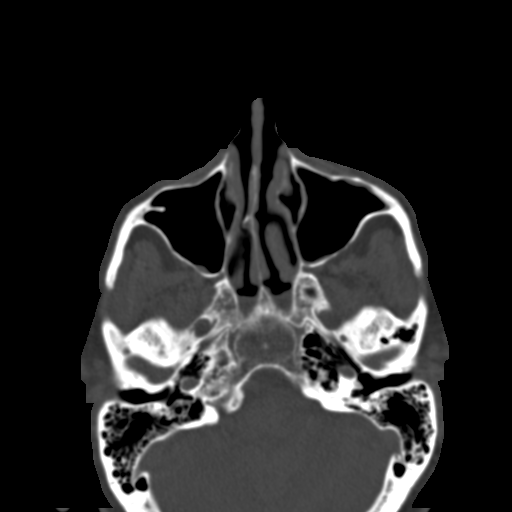
[im 70/113  bone]
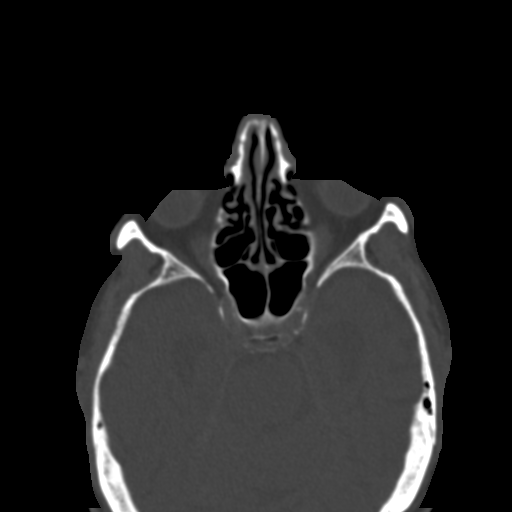
[im 82/113  bone]
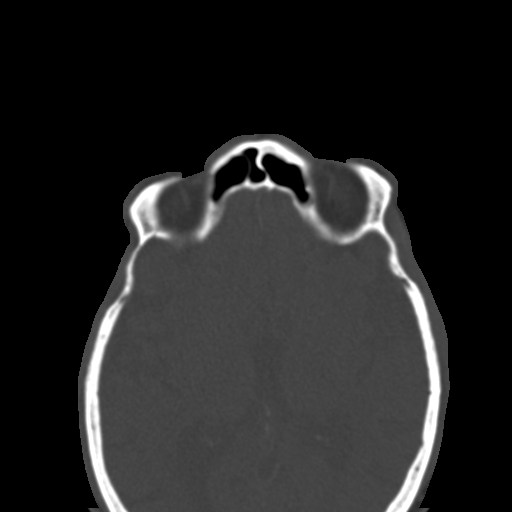
[im 93/113  bone]
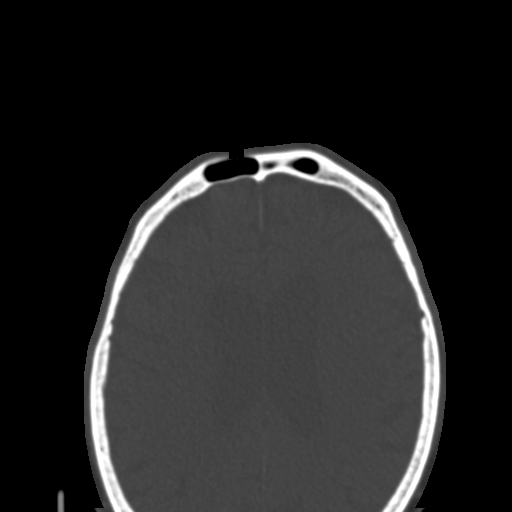
[im 105/113  brain]
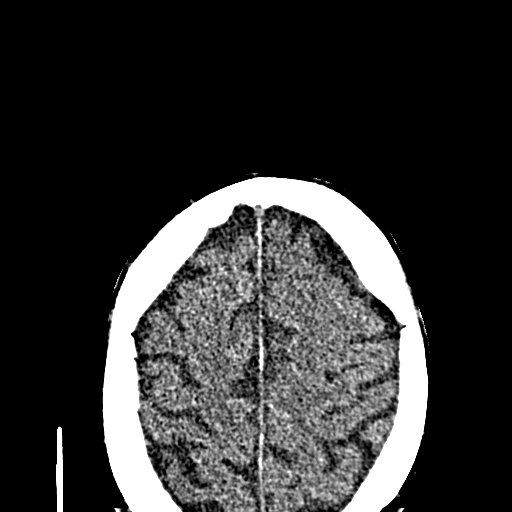
[im 105/113  bone]
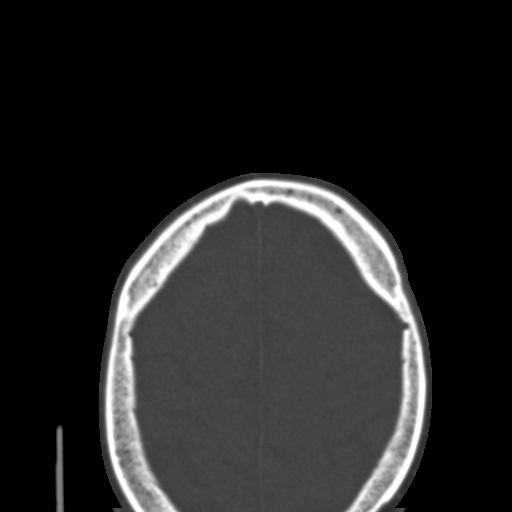

[Series 7: coronal soft · coronal · 0.40mm/px · 3 of 103 slices shown]
[im 35/103  bone]
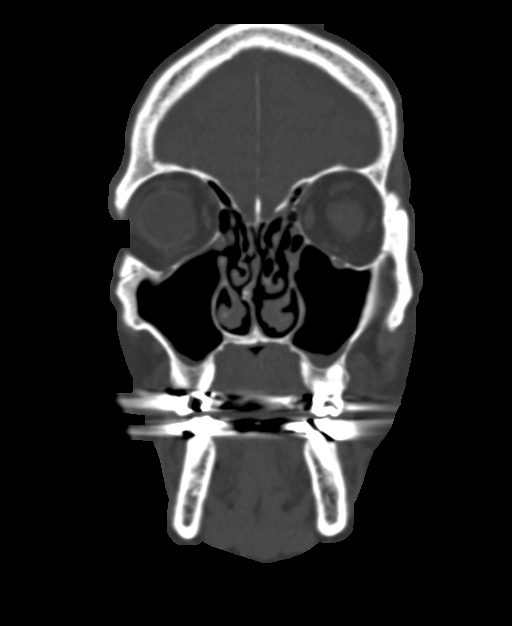
[im 46/103  bone]
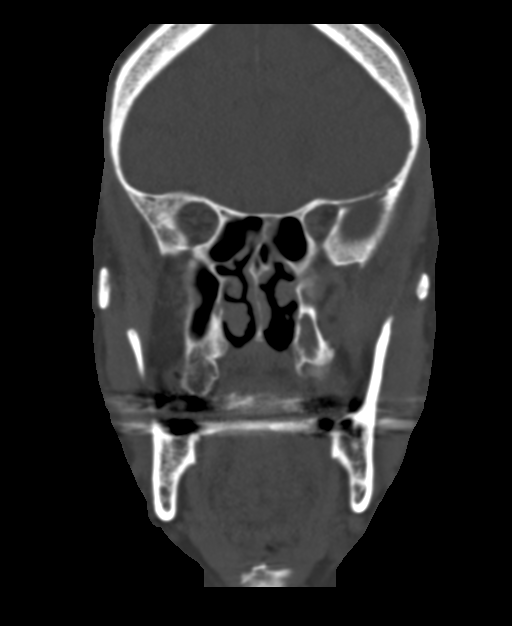
[im 57/103  bone]
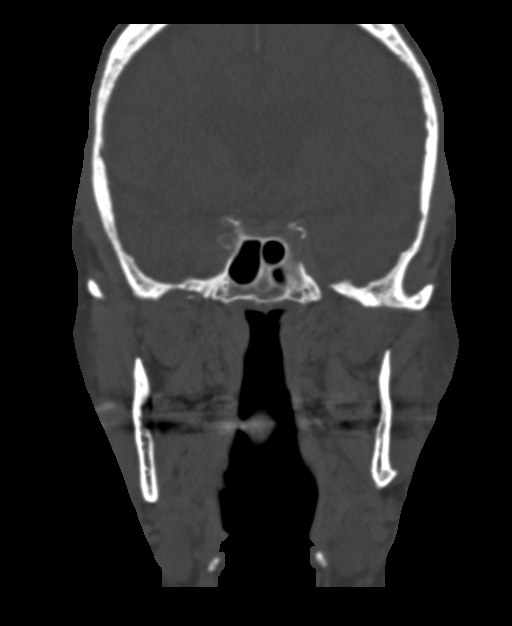

[Series 8: sagittal soft · sagittal · 0.46mm/px · 3 of 94 slices shown]
[im 32/94  bone]
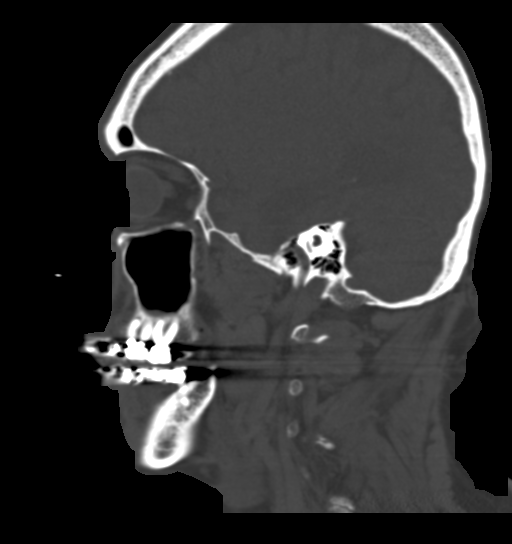
[im 47/94  bone]
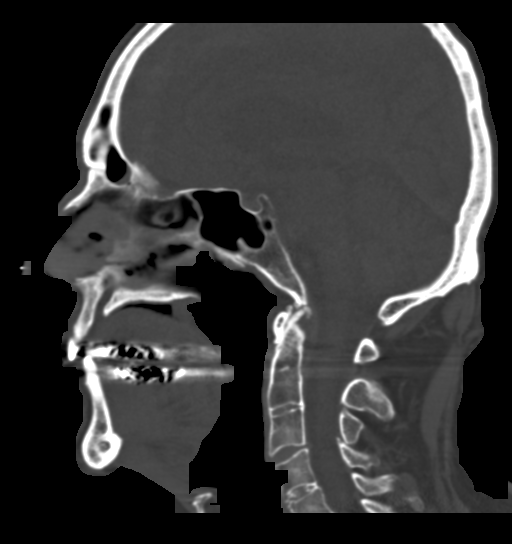
[im 63/94  bone]
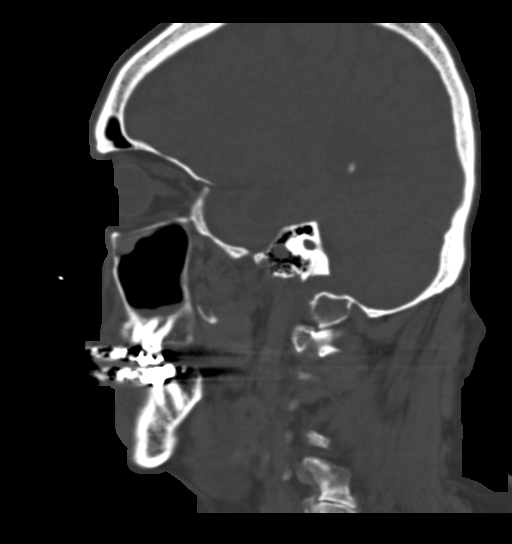

[15 of 47 positions shown; findings below may reference images not displayed]

FINDINGS: CT HEAD FINDINGS

Brain: No intracranial hemorrhage or extra-axial collection. No mass
effect. Generalized volume loss with mild white matter
hypoattenuation.

Vascular: Small amount of atherosclerotic calcification of the
internal carotid arteries at skull base.

Skull: Left frontal scalp abrasion.  No skull fracture.

Other: None

CT MAXILLOFACIAL FINDINGS

Osseous: No fracture.  No mandibular dislocation.

Orbits: Normal

Sinuses: Mild mucosal thickening in the left maxillary sinus.

Soft tissues: Small left facial hematoma.

CT CERVICAL SPINE FINDINGS

Alignment: Normal

Skull base and vertebrae: No fracture

Soft tissues and spinal canal: Spinal canal is patent.

Disc levels: Severe right C4 foraminal stenosis due to uncovertebral
and facet hypertrophy.

Upper chest: Negative

Other: None
IMPRESSION: 1. No acute intracranial abnormality.
2. Left frontal scalp abrasion and small left facial hematoma
without skull fracture.
3. No acute fracture or static subluxation of the cervical spine.
4. Severe right C4 foraminal stenosis due to uncovertebral and facet
hypertrophy.

## 2022-03-12 IMAGING — CR DG RIBS W/ CHEST 3+V*L*
4 series · 4 of 4 positions shown · non-contrast
Comparison: None.

CLINICAL DATA: Fall

EXAM:
LEFT RIBS AND CHEST - 3+ VIEW

[t chest supine]
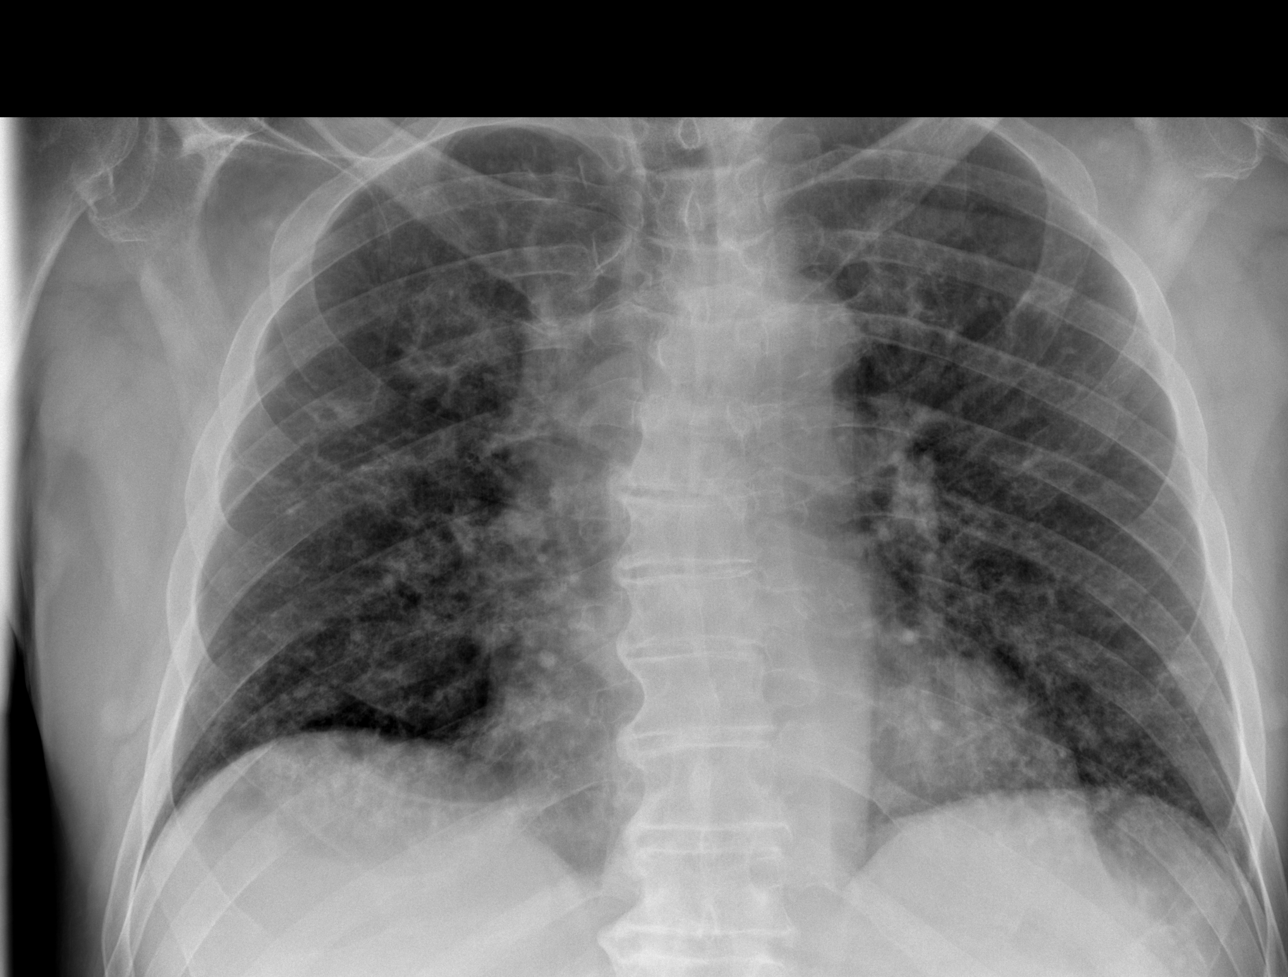

[t ribs ap upper left]
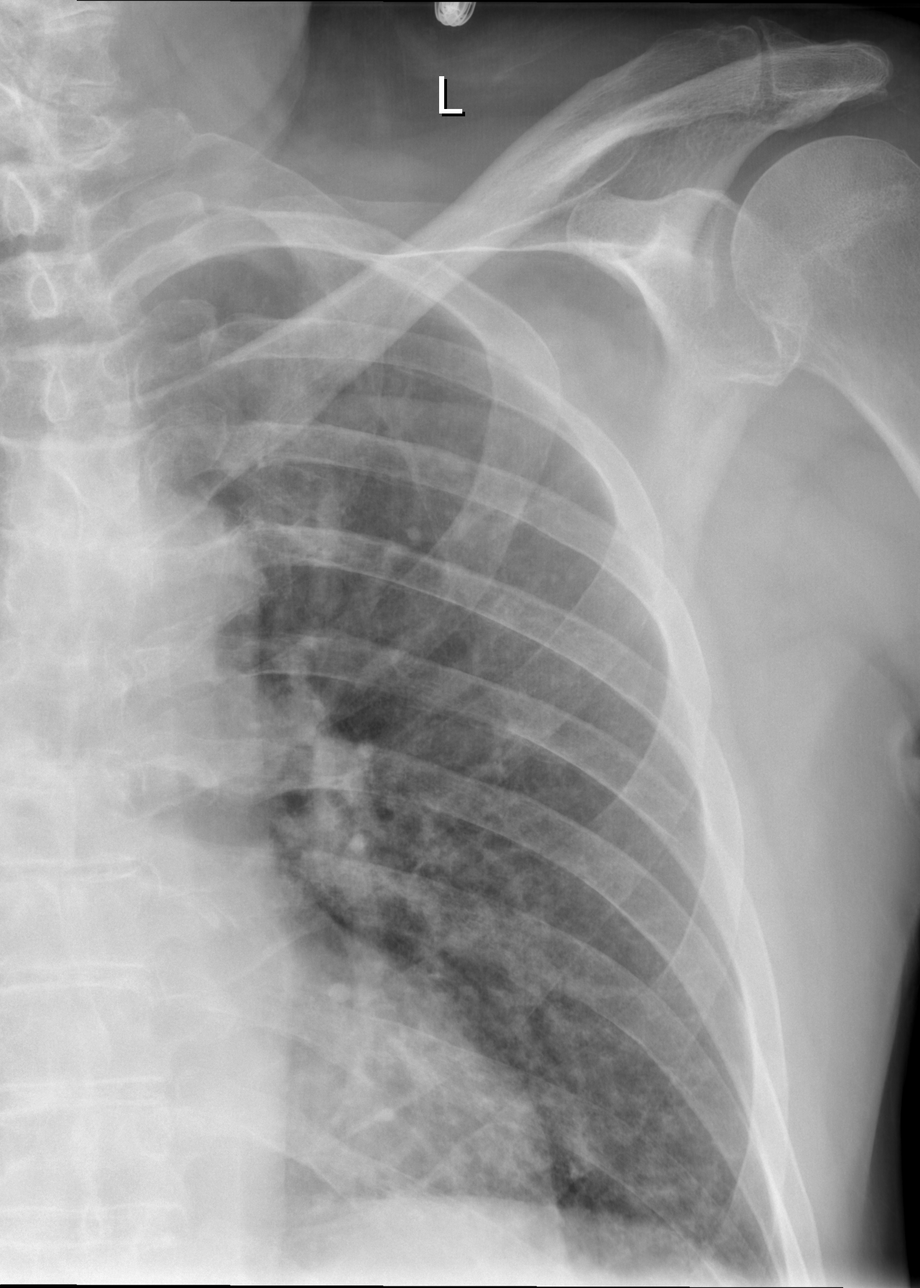

[t ribs ap lower left]
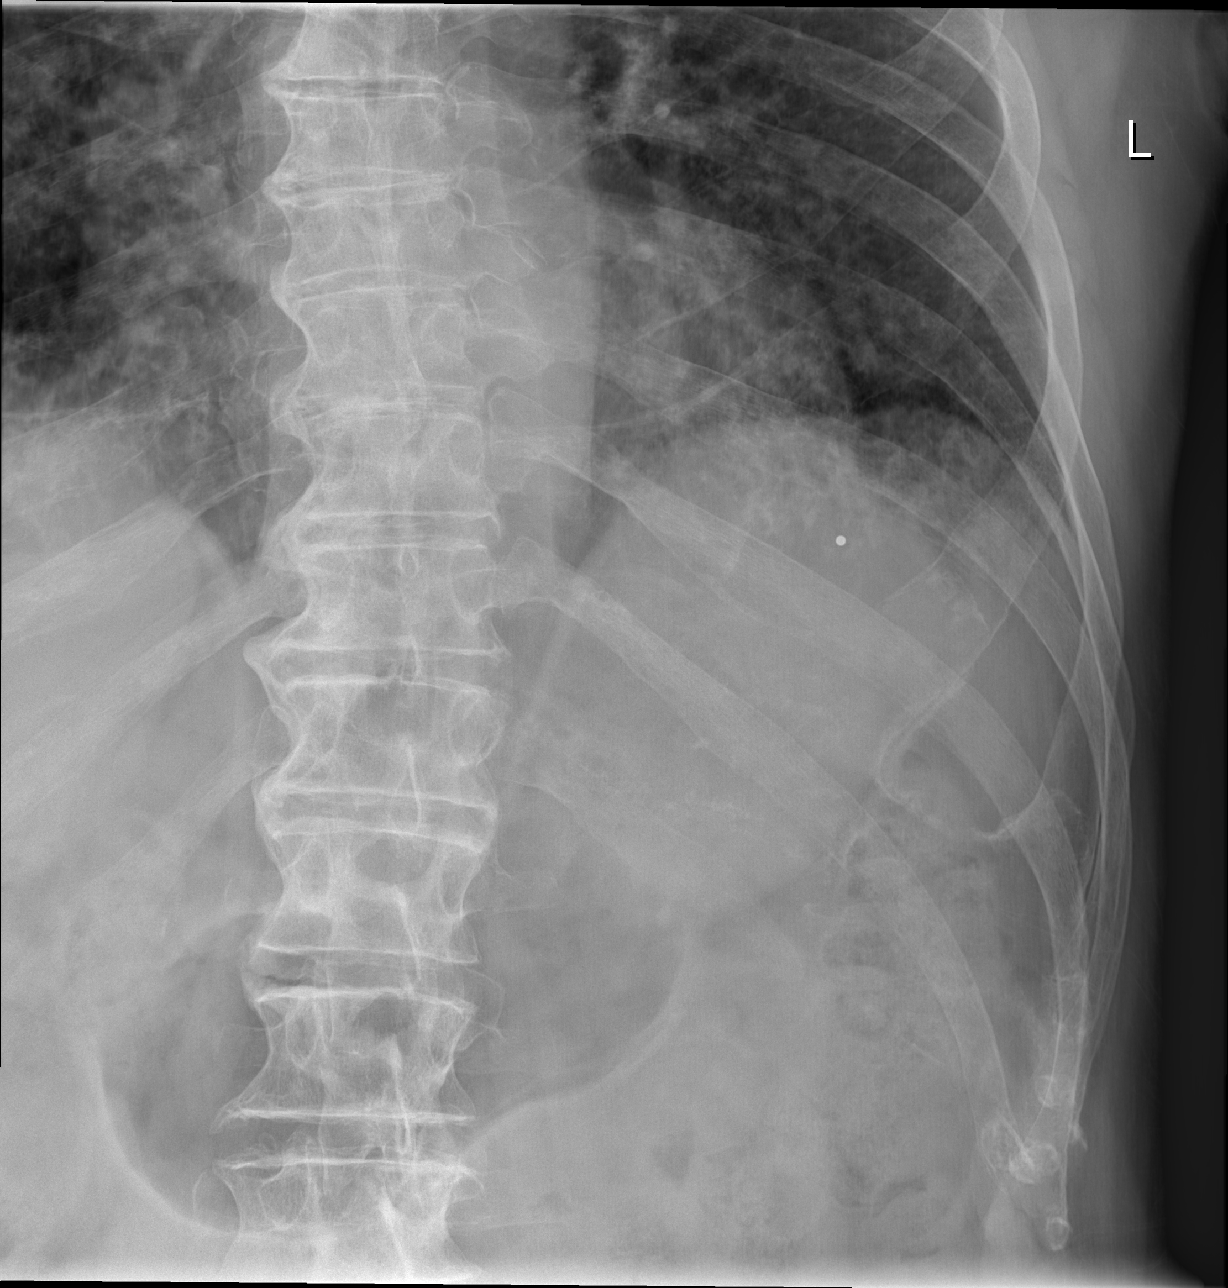

[t ribs lpo left]
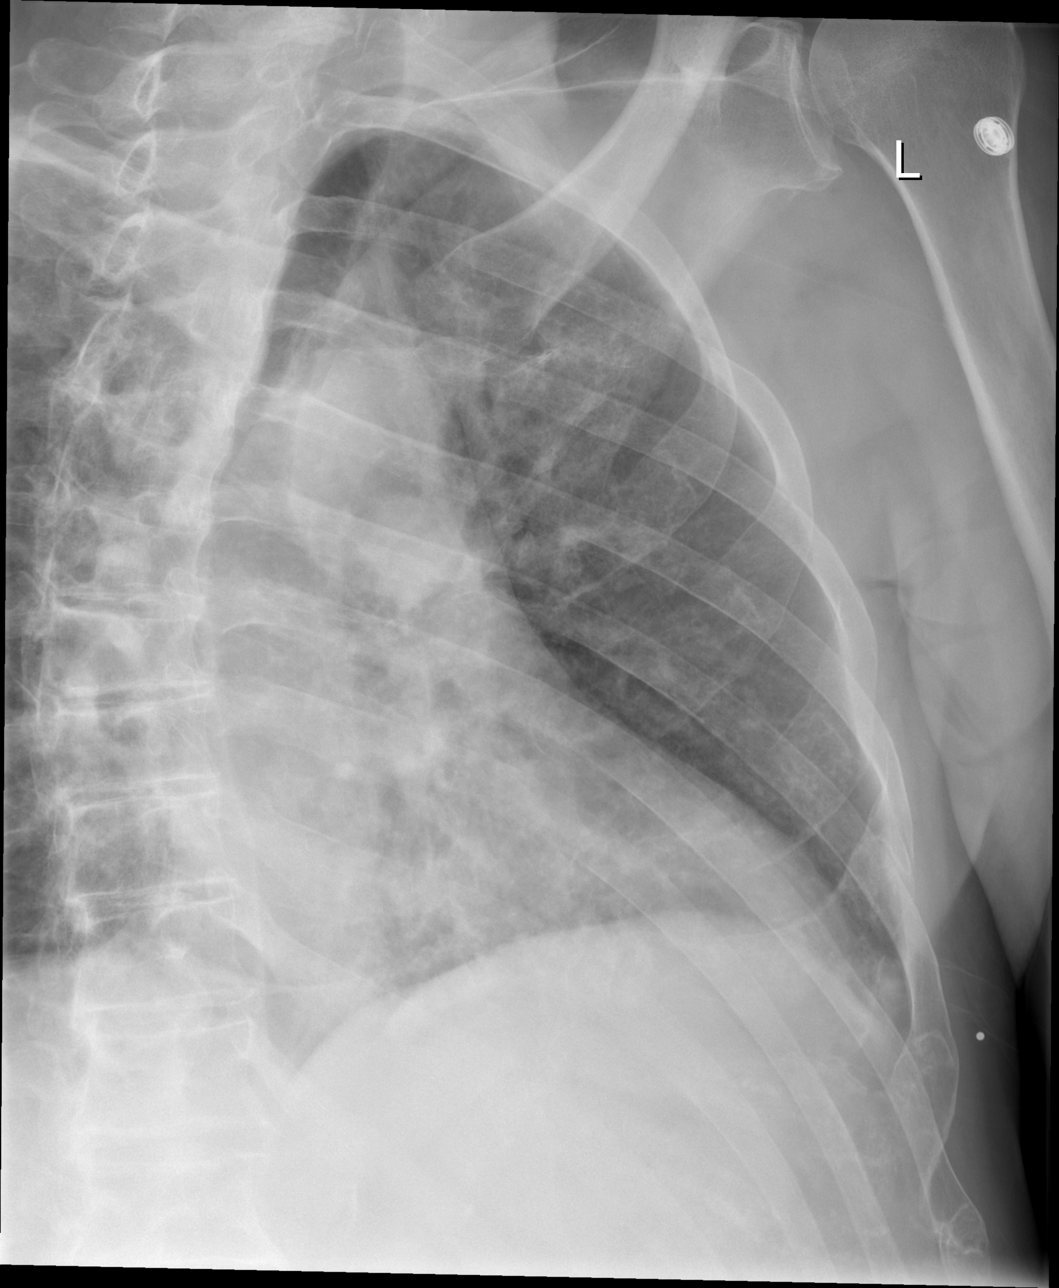

[4 of 4 positions shown; findings below may reference images not displayed]

FINDINGS: No fracture or other bone lesions are seen involving the ribs. There
is no evidence of pneumothorax or pleural effusion. Coarse
interstitial opacities are seen in both lungs. Mild pulmonary
vascular congestion as well. The aorta is calcified. The remaining
cardiomediastinal contours are unremarkable for portable technique.
IMPRESSION: 1. No acute rib fracture or other acute osseous abnormality of the
chest wall.
2. Coarse interstitial opacities in both lungs could reflect
interstitial edema, infection or chronic interstitial disease in the
absence of comparison.
3. Mild pulmonary vascular congestion.

## 2022-03-12 IMAGING — CR DG KNEE COMPLETE 4+V*L*
4 series · 4 of 4 positions shown · non-contrast
Comparison: None.

CLINICAL DATA: Fall, multiple lacerations and bruising

EXAM:
LEFT KNEE - COMPLETE 4+ VIEW

[t knee obl left (1 of 2)]
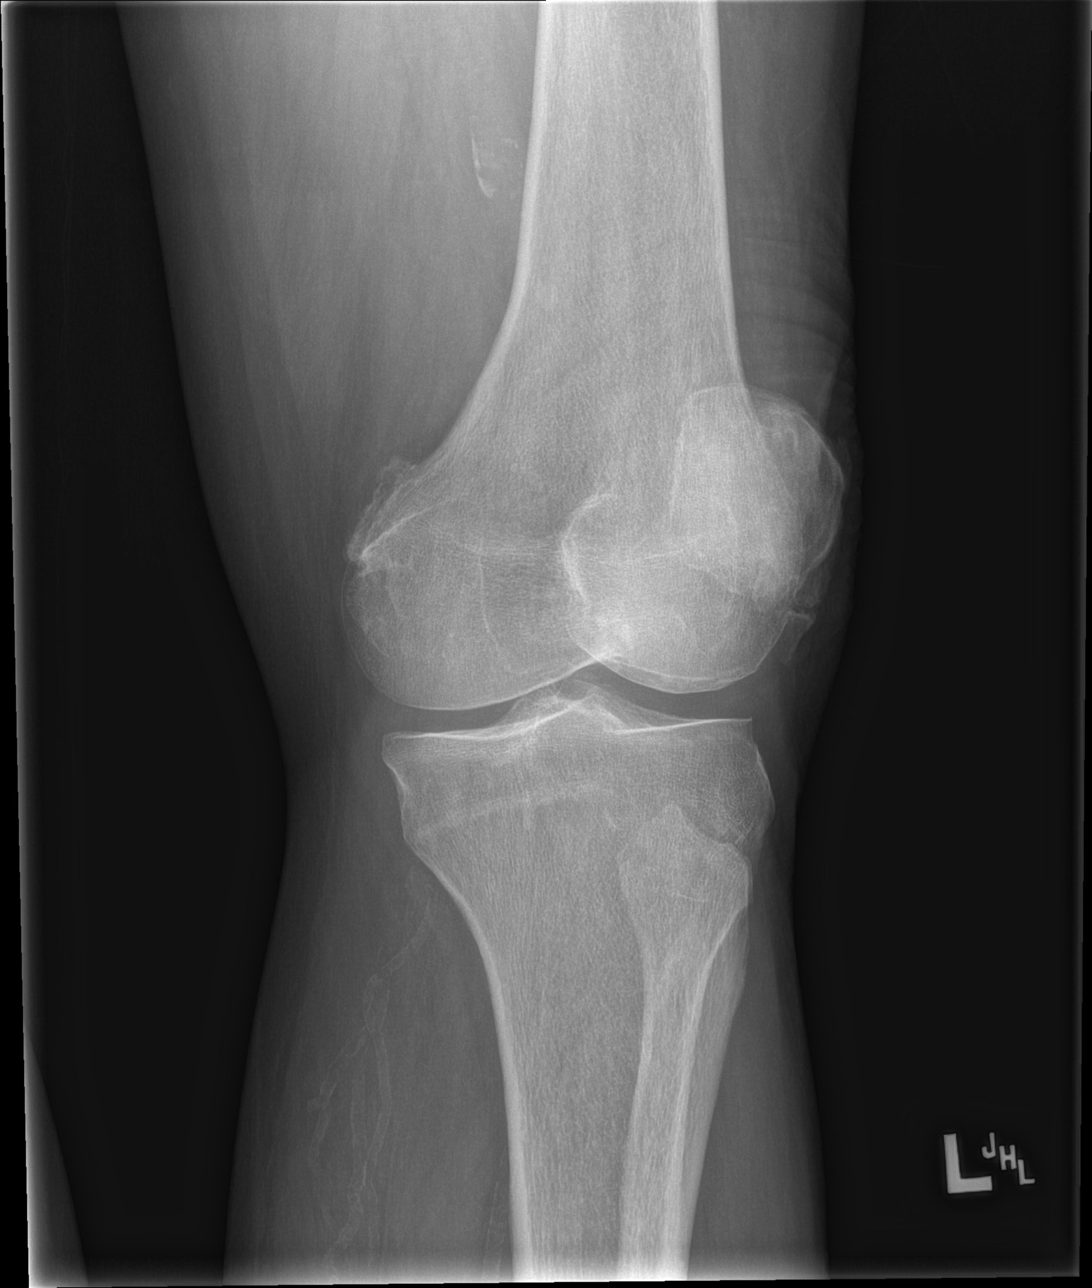

[t knee ap left]
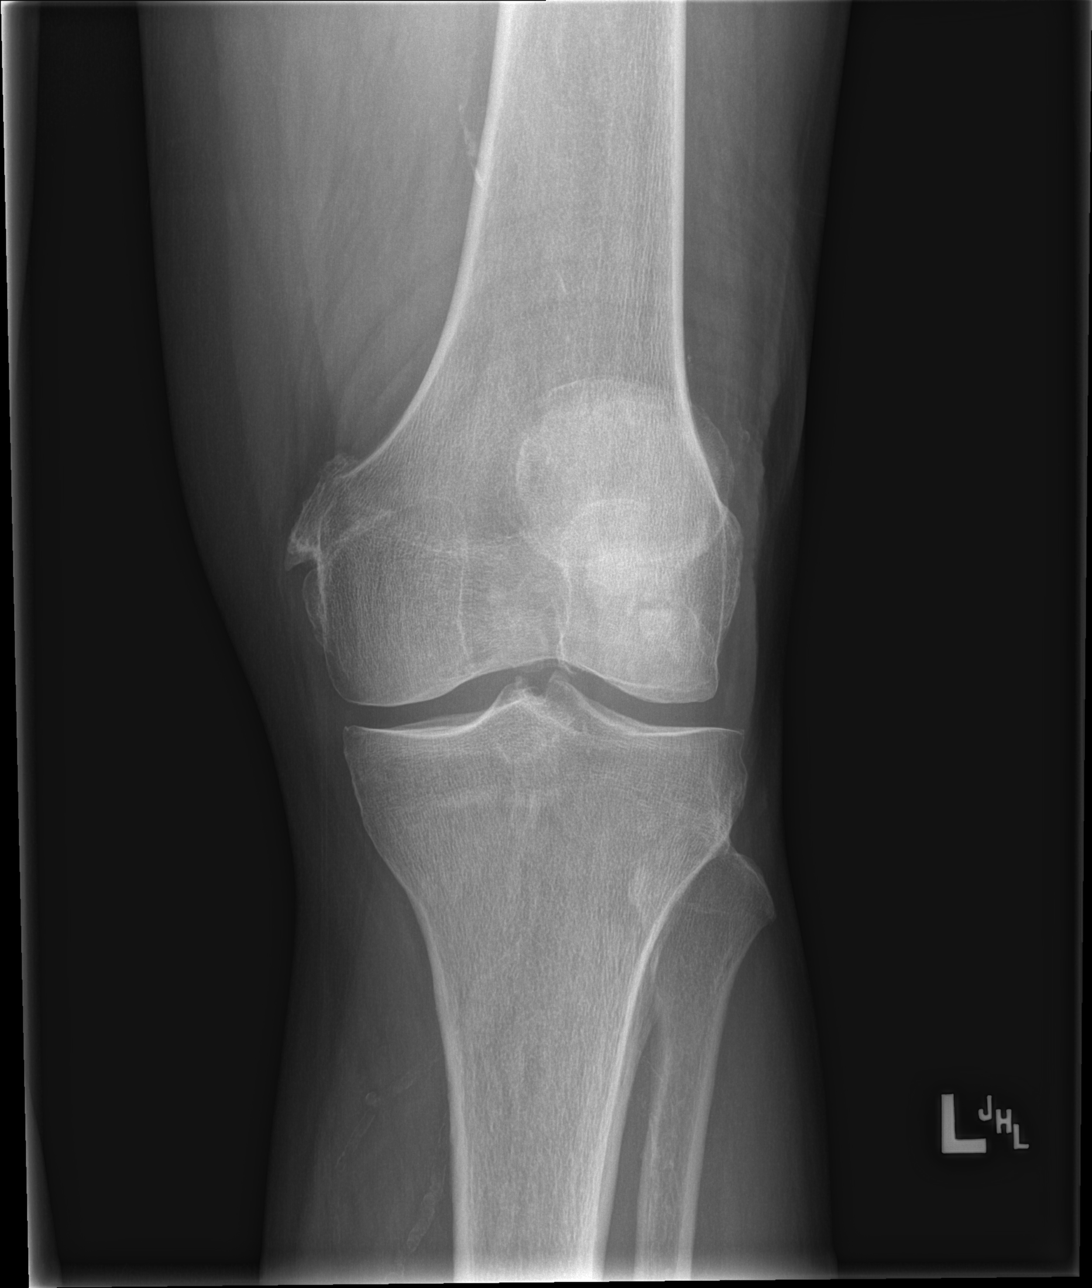

[t knee obl left (2 of 2)]
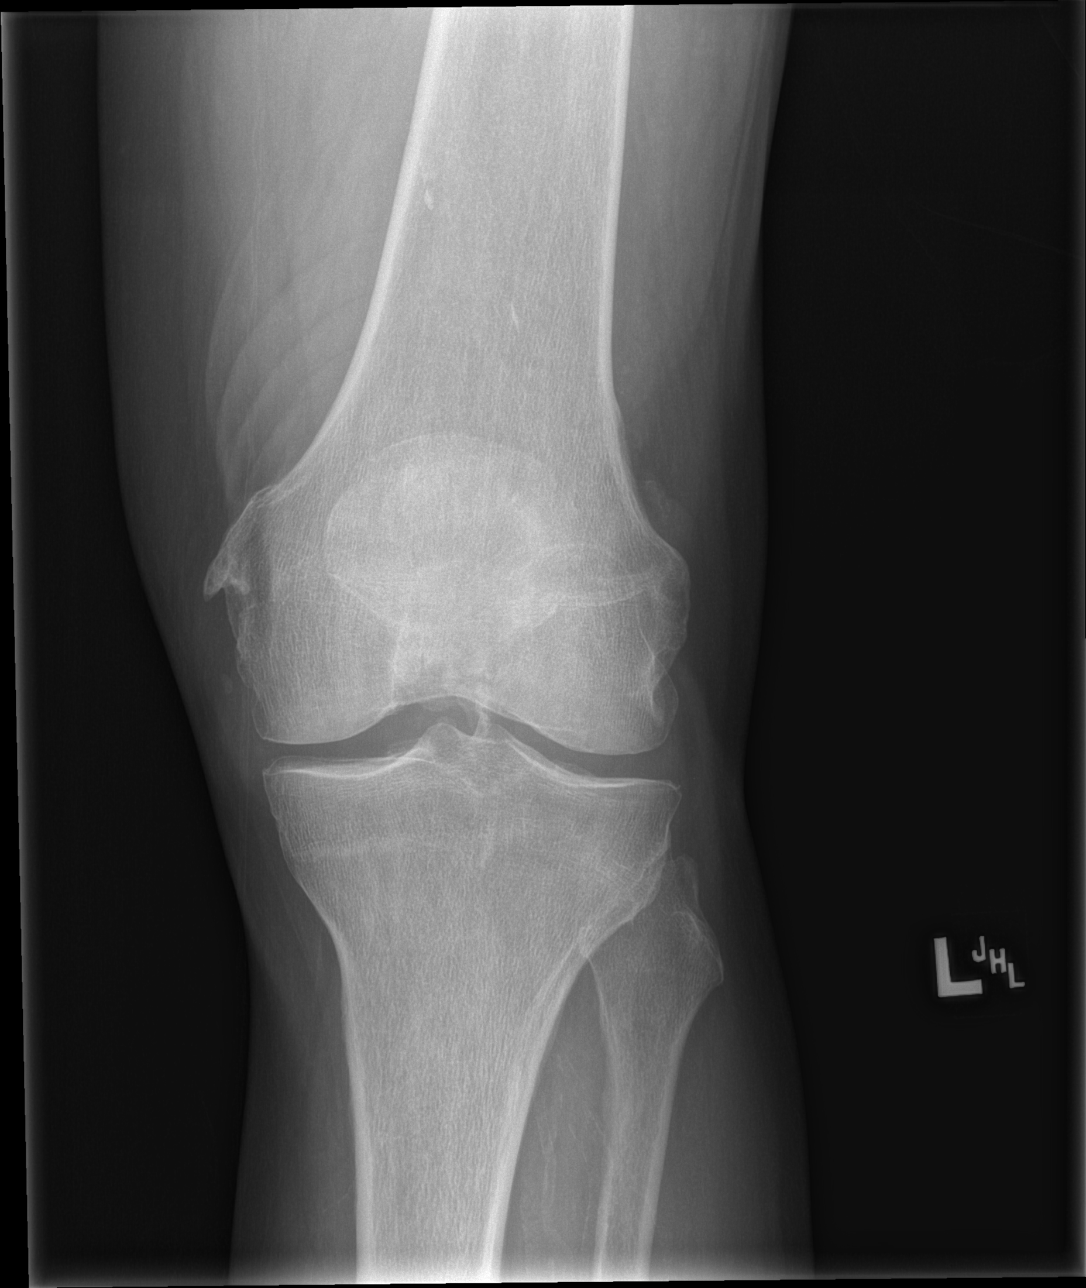

[x knee lat left]
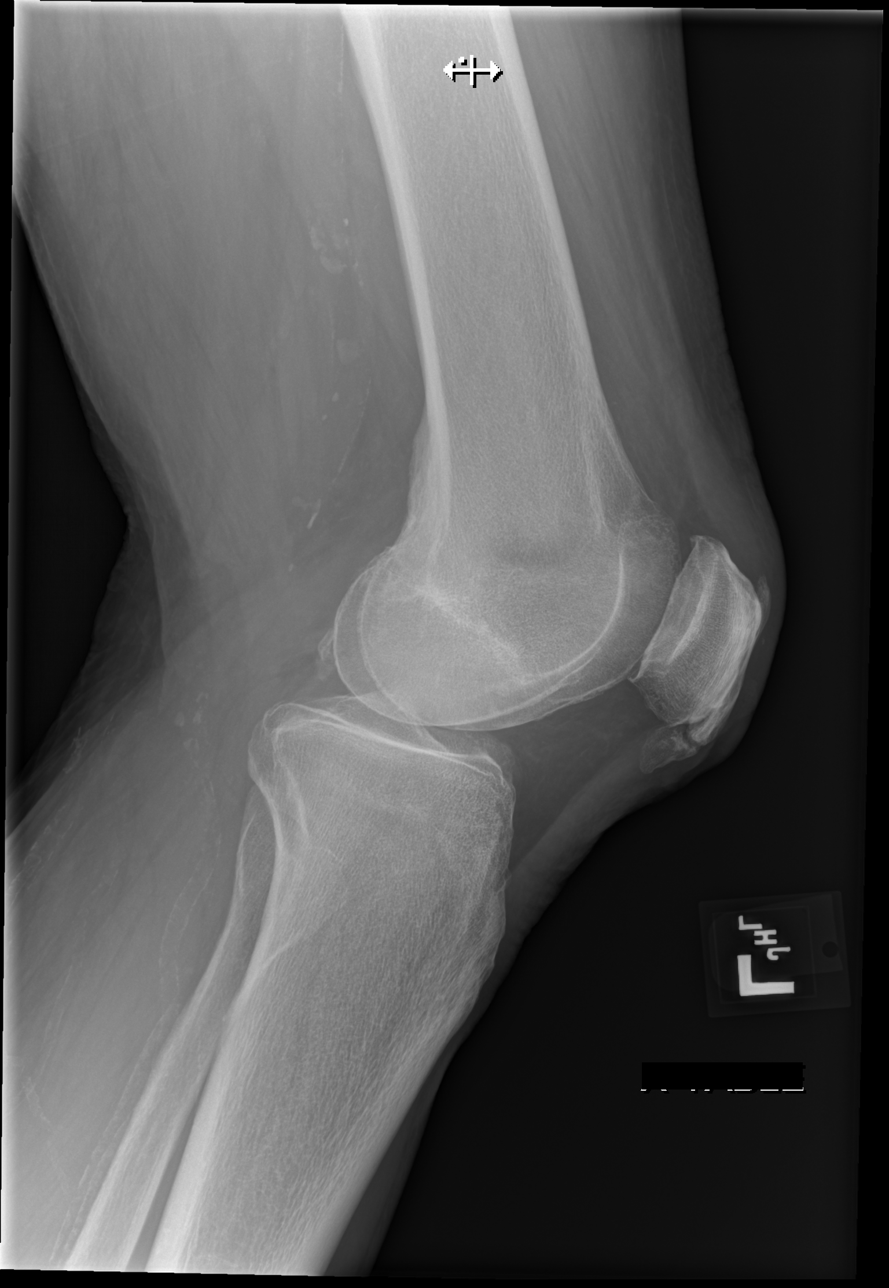

[4 of 4 positions shown; findings below may reference images not displayed]

FINDINGS: Soft tissue swelling and thickening is seen anteriorly. Correlate
for contusive change in at most minimal prepatellar bursitis. No
sizeable joint effusion. No acute bony abnormality. Specifically, no
fracture, subluxation, or dislocation. Bony excrescence along the
superomedial aspect of the medial femoral condyle may be on the
spectrum of a Pellegrini-Stieda lesion which could implicate a
remote MCL injury. Enthesopathic changes are noted along the
extensor mechanism with superior inferior anterior patellar
spurring. Corticated os ossific fragment anteroinferior to the
patella may reflect a fragmented osteophyte or sequela of prior
trauma, unlikely to be acute. Vascular calcium in the soft tissues.
IMPRESSION: 1. Soft tissue swelling and thickening anteriorly. Correlate for
contusive change and/or trace prepatellar bursitis.
2. No acute osseous abnormality.
3. Bony excrescence along the medial femoral condyle, possibly
Pellegrini-Stieda lesion which could implicate a remote MCL injury.
4. Tricompartmental degenerative changes and enthesopathy along the
extensor mechanism.
5. Corticated fragment near the anteroinferior patella likely
degenerative or remote traumatic.

## 2022-03-20 ENCOUNTER — Ambulatory Visit (INDEPENDENT_AMBULATORY_CARE_PROVIDER_SITE_OTHER): Payer: Medicare Other | Admitting: Podiatry

## 2022-03-20 DIAGNOSIS — B351 Tinea unguium: Secondary | ICD-10-CM | POA: Diagnosis not present

## 2022-03-20 MED ORDER — CICLOPIROX 8 % EX SOLN: Freq: Every day | CUTANEOUS | 0 refills | Status: DC

## 2022-03-20 NOTE — Progress Notes (Signed)
  Subjective:  Patient ID: Adrian Ray, male    DOB: [birth year],  MRN: [id number]  Chief Complaint  Patient presents with   Callouses    88 y.o. male presents with the above complaint.  Patient presents with thickened elongated dystrophic toenails x10 no pain on palpation.  Patient said the fungus has been present for quite some time.  He would like to discuss topical options is not a good candidate for oral medication.  0 out of 10 pain of the pillar that been present for many many years.   Review of Systems: Negative except as noted in the HPI. Denies N/V/F/Ch.  Past Medical History:  Diagnosis Date   Cataracts, bilateral    GERD (gastroesophageal reflux disease)    Hyperlipemia     Current Outpatient Medications:    ciclopirox (PENLAC) 8 % solution, Apply topically at bedtime. Apply over nail and surrounding skin. Apply daily over previous coat. After seven (7) days, may remove with alcohol and continue cycle., Disp: 6.6 mL, Rfl: 0   acetaminophen (TYLENOL) 500 MG tablet, Take 1,000 mg by mouth every 8 (eight) hours., Disp: , Rfl:    fluticasone (FLONASE) 50 MCG/ACT nasal spray, Place 1 spray into both nostrils daily as needed for allergies., Disp: , Rfl:    rosuvastatin (CRESTOR) 5 MG tablet, Take 1 tablet by mouth 3 (three) times a week. Monday Wednesday Friday, Disp: , Rfl:   Social History   Tobacco Use  Smoking Status Never  Smokeless Tobacco Never    Allergies  Allergen Reactions   Tetracycline     Other reaction(s): Other (See Comments), Unknown Lips swelled    Codeine     Other reaction(s): Unknown   Objective:  There were no vitals filed for this visit. There is no height or weight on file to calculate BMI. Constitutional Well developed. Well nourished.  Vascular Dorsalis pedis pulses palpable bilaterally. Posterior tibial pulses palpable bilaterally. Capillary refill normal to all digits.  No cyanosis or clubbing noted. Pedal hair growth normal.   Neurologic Normal speech. Oriented to person, place, and time. Epicritic sensation to light touch grossly present bilaterally.  Dermatologic Nails thickened elongated dystrophic toenails x10 mild pain on palpation.  Nail fungus noted Skin within normal limits  Orthopedic: Normal joint ROM without pain or crepitus bilaterally. No visible deformities. No bony tenderness.   Radiographs: None Assessment:   1. Onychomycosis due to dermatophyte   2. Nail fungus    Plan:  Patient was evaluated and treated and all questions answered.  Onychomycosis toenails x10 -Educated the patient on the etiology of onychomycosis and various treatment options associated with improving the fungal load.  I explained to the patient that there is 3 treatment options available to treat the onychomycosis including topical, p.o., laser treatment.  Patient elected to undergo the Penlac.  I advised him apply twice a day.  He states understanding.  If any foot and ankle issues on future last week, he can see me.  He states understanding.  No follow-ups on file.

## 2022-04-03 DIAGNOSIS — M5451 Vertebrogenic low back pain: Secondary | ICD-10-CM | POA: Diagnosis not present

## 2022-04-03 DIAGNOSIS — M4186 Other forms of scoliosis, lumbar region: Secondary | ICD-10-CM | POA: Diagnosis not present

## 2022-04-03 DIAGNOSIS — M47816 Spondylosis without myelopathy or radiculopathy, lumbar region: Secondary | ICD-10-CM | POA: Diagnosis not present

## 2022-04-06 DIAGNOSIS — M545 Low back pain, unspecified: Secondary | ICD-10-CM | POA: Diagnosis not present

## 2022-04-08 DIAGNOSIS — M4186 Other forms of scoliosis, lumbar region: Secondary | ICD-10-CM | POA: Diagnosis not present

## 2022-04-08 DIAGNOSIS — M47896 Other spondylosis, lumbar region: Secondary | ICD-10-CM | POA: Diagnosis not present

## 2022-04-09 DIAGNOSIS — S39012D Strain of muscle, fascia and tendon of lower back, subsequent encounter: Secondary | ICD-10-CM | POA: Diagnosis not present

## 2022-04-09 DIAGNOSIS — M6281 Muscle weakness (generalized): Secondary | ICD-10-CM | POA: Diagnosis not present

## 2022-04-16 DIAGNOSIS — D485 Neoplasm of uncertain behavior of skin: Secondary | ICD-10-CM | POA: Diagnosis not present

## 2022-04-16 DIAGNOSIS — L821 Other seborrheic keratosis: Secondary | ICD-10-CM | POA: Diagnosis not present

## 2022-04-16 DIAGNOSIS — L570 Actinic keratosis: Secondary | ICD-10-CM | POA: Diagnosis not present

## 2022-04-16 DIAGNOSIS — Z85820 Personal history of malignant melanoma of skin: Secondary | ICD-10-CM | POA: Diagnosis not present

## 2022-04-16 DIAGNOSIS — C4359 Malignant melanoma of other part of trunk: Secondary | ICD-10-CM | POA: Diagnosis not present

## 2022-04-16 DIAGNOSIS — Z85828 Personal history of other malignant neoplasm of skin: Secondary | ICD-10-CM | POA: Diagnosis not present

## 2022-04-20 DIAGNOSIS — Z23 Encounter for immunization: Secondary | ICD-10-CM | POA: Diagnosis not present

## 2022-04-20 DIAGNOSIS — Z0000 Encounter for general adult medical examination without abnormal findings: Secondary | ICD-10-CM | POA: Diagnosis not present

## 2022-04-20 DIAGNOSIS — R351 Nocturia: Secondary | ICD-10-CM | POA: Diagnosis not present

## 2022-04-20 DIAGNOSIS — H903 Sensorineural hearing loss, bilateral: Secondary | ICD-10-CM | POA: Diagnosis not present

## 2022-04-20 DIAGNOSIS — Z1331 Encounter for screening for depression: Secondary | ICD-10-CM | POA: Diagnosis not present

## 2022-04-20 DIAGNOSIS — E785 Hyperlipidemia, unspecified: Secondary | ICD-10-CM | POA: Diagnosis not present

## 2022-04-20 DIAGNOSIS — N390 Urinary tract infection, site not specified: Secondary | ICD-10-CM | POA: Diagnosis not present

## 2022-04-20 DIAGNOSIS — R350 Frequency of micturition: Secondary | ICD-10-CM | POA: Diagnosis not present

## 2022-04-20 DIAGNOSIS — S39012D Strain of muscle, fascia and tendon of lower back, subsequent encounter: Secondary | ICD-10-CM | POA: Diagnosis not present

## 2022-04-20 DIAGNOSIS — M6281 Muscle weakness (generalized): Secondary | ICD-10-CM | POA: Diagnosis not present

## 2022-04-20 DIAGNOSIS — E538 Deficiency of other specified B group vitamins: Secondary | ICD-10-CM | POA: Diagnosis not present

## 2022-04-20 DIAGNOSIS — K5730 Diverticulosis of large intestine without perforation or abscess without bleeding: Secondary | ICD-10-CM | POA: Diagnosis not present

## 2022-04-23 DIAGNOSIS — S39012D Strain of muscle, fascia and tendon of lower back, subsequent encounter: Secondary | ICD-10-CM | POA: Diagnosis not present

## 2022-04-23 DIAGNOSIS — M6281 Muscle weakness (generalized): Secondary | ICD-10-CM | POA: Diagnosis not present

## 2022-05-05 DIAGNOSIS — N390 Urinary tract infection, site not specified: Secondary | ICD-10-CM | POA: Diagnosis not present

## 2022-05-08 DIAGNOSIS — N401 Enlarged prostate with lower urinary tract symptoms: Secondary | ICD-10-CM | POA: Diagnosis not present

## 2022-05-08 DIAGNOSIS — R351 Nocturia: Secondary | ICD-10-CM | POA: Diagnosis not present

## 2022-05-11 DIAGNOSIS — Z85828 Personal history of other malignant neoplasm of skin: Secondary | ICD-10-CM | POA: Diagnosis not present

## 2022-05-11 DIAGNOSIS — Z85820 Personal history of malignant melanoma of skin: Secondary | ICD-10-CM | POA: Diagnosis not present

## 2022-05-11 DIAGNOSIS — C4359 Malignant melanoma of other part of trunk: Secondary | ICD-10-CM | POA: Diagnosis not present

## 2022-05-11 DIAGNOSIS — D0359 Melanoma in situ of other part of trunk: Secondary | ICD-10-CM | POA: Diagnosis not present

## 2022-05-28 DIAGNOSIS — L720 Epidermal cyst: Secondary | ICD-10-CM | POA: Diagnosis not present

## 2022-05-28 DIAGNOSIS — D485 Neoplasm of uncertain behavior of skin: Secondary | ICD-10-CM | POA: Diagnosis not present

## 2022-05-28 DIAGNOSIS — Z85828 Personal history of other malignant neoplasm of skin: Secondary | ICD-10-CM | POA: Diagnosis not present

## 2022-05-28 DIAGNOSIS — Z85820 Personal history of malignant melanoma of skin: Secondary | ICD-10-CM | POA: Diagnosis not present

## 2022-06-03 DIAGNOSIS — R351 Nocturia: Secondary | ICD-10-CM | POA: Diagnosis not present

## 2022-06-03 DIAGNOSIS — N401 Enlarged prostate with lower urinary tract symptoms: Secondary | ICD-10-CM | POA: Diagnosis not present

## 2022-06-03 DIAGNOSIS — R350 Frequency of micturition: Secondary | ICD-10-CM | POA: Diagnosis not present

## 2022-06-04 ENCOUNTER — Ambulatory Visit (INDEPENDENT_AMBULATORY_CARE_PROVIDER_SITE_OTHER): Payer: Medicare Other | Admitting: Podiatry

## 2022-06-04 ENCOUNTER — Encounter: Payer: Self-pay | Admitting: Podiatry

## 2022-06-04 DIAGNOSIS — Q828 Other specified congenital malformations of skin: Secondary | ICD-10-CM

## 2022-06-04 NOTE — Progress Notes (Signed)
  Subjective:  Patient ID: Adrian Ray, male    DOB: 1934-06-01,  MRN: [id number]  Chief Complaint  Patient presents with   Nail Problem    Nail trim     88 y.o. male presents with the above complaint.  Patient presents with left submetatarsal 2 porokeratotic lesion painful to touch is progressive and worsens with ambulation worse with pressure pain scale is 5 out of 10 sharp shooting in nature.  He would like to have it debrided and it does give him relief.     Review of Systems: Negative except as noted in the HPI. Denies N/V/F/Ch.  Past Medical History:  Diagnosis Date   Cataracts, bilateral    GERD (gastroesophageal reflux disease)    Hyperlipemia     Current Outpatient Medications:    cephALEXin (KEFLEX) 500 MG capsule, Take 500 mg by mouth 3 (three) times daily., Disp: , Rfl:    ciprofloxacin (CIPRO) 250 MG tablet, Take 250 mg by mouth 2 (two) times daily., Disp: , Rfl:    finasteride (PROSCAR) 5 MG tablet, Take 5 mg by mouth daily., Disp: , Rfl:    mupirocin ointment (BACTROBAN) 2 %, SMARTSIG:1 Application Topical 2-3 Times Daily, Disp: , Rfl:    tamsulosin (FLOMAX) 0.4 MG CAPS capsule, Take by mouth., Disp: , Rfl:    acetaminophen (TYLENOL) 500 MG tablet, Take 1,000 mg by mouth every 8 (eight) hours., Disp: , Rfl:    ciclopirox (PENLAC) 8 % solution, Apply topically at bedtime. Apply over nail and surrounding skin. Apply daily over previous coat. After seven (7) days, may remove with alcohol and continue cycle., Disp: 6.6 mL, Rfl: 0   fluticasone (FLONASE) 50 MCG/ACT nasal spray, Place 1 spray into both nostrils daily as needed for allergies., Disp: , Rfl:    rosuvastatin (CRESTOR) 5 MG tablet, Take 1 tablet by mouth 3 (three) times a week. Monday Wednesday Friday, Disp: , Rfl:   Social History   Tobacco Use  Smoking Status Never  Smokeless Tobacco Never    Allergies  Allergen Reactions   Tetracycline     Other reaction(s): Other (See Comments), Unknown Lips  swelled    Codeine     Other reaction(s): Unknown   Objective:  There were no vitals filed for this visit. There is no height or weight on file to calculate BMI. Constitutional Well developed. Well nourished.  Vascular Dorsalis pedis pulses palpable bilaterally. Posterior tibial pulses palpable bilaterally. Capillary refill normal to all digits.  No cyanosis or clubbing noted. Pedal hair growth normal.  Neurologic Normal speech. Oriented to person, place, and time. Epicritic sensation to light touch grossly present bilaterally.  Dermatologic -Check lesion with central nucleated core noted left submetatarsal 2 pain on palpation.  No pinpoint bleeding noted upon debridement  Orthopedic: Normal joint ROM without pain or crepitus bilaterally. No visible deformities. No bony tenderness.   Radiographs: None Assessment:   1. Porokeratosis    Plan:  Patient was evaluated and treated and all questions answered.  Left submetatarsal 2 porokeratosis -All questions and concerns were discussed with the patient in extensive detail -Given the amount of pain that he is having he will benefit from debridement of the lesion, as a courtesy using chisel blade to handle lesion with healthy dry tissue no complication noted no pinpoint bleeding noted. -I discussed shoe gear modification as well.  And offloading pads  No follow-ups on file.

## 2022-07-16 DIAGNOSIS — L821 Other seborrheic keratosis: Secondary | ICD-10-CM | POA: Diagnosis not present

## 2022-07-16 DIAGNOSIS — Z85828 Personal history of other malignant neoplasm of skin: Secondary | ICD-10-CM | POA: Diagnosis not present

## 2022-07-16 DIAGNOSIS — Z85820 Personal history of malignant melanoma of skin: Secondary | ICD-10-CM | POA: Diagnosis not present

## 2022-07-16 DIAGNOSIS — L858 Other specified epidermal thickening: Secondary | ICD-10-CM | POA: Diagnosis not present

## 2022-07-16 DIAGNOSIS — L570 Actinic keratosis: Secondary | ICD-10-CM | POA: Diagnosis not present

## 2022-07-23 DIAGNOSIS — N401 Enlarged prostate with lower urinary tract symptoms: Secondary | ICD-10-CM | POA: Diagnosis not present

## 2022-07-23 DIAGNOSIS — H903 Sensorineural hearing loss, bilateral: Secondary | ICD-10-CM | POA: Diagnosis not present

## 2022-07-23 DIAGNOSIS — R351 Nocturia: Secondary | ICD-10-CM | POA: Diagnosis not present

## 2022-07-23 DIAGNOSIS — E785 Hyperlipidemia, unspecified: Secondary | ICD-10-CM | POA: Diagnosis not present

## 2022-07-23 DIAGNOSIS — L853 Xerosis cutis: Secondary | ICD-10-CM | POA: Diagnosis not present

## 2022-08-27 ENCOUNTER — Ambulatory Visit (INDEPENDENT_AMBULATORY_CARE_PROVIDER_SITE_OTHER): Payer: Medicare Other | Admitting: Podiatry

## 2022-08-27 DIAGNOSIS — Q828 Other specified congenital malformations of skin: Secondary | ICD-10-CM | POA: Diagnosis not present

## 2022-08-27 NOTE — Progress Notes (Signed)
  Subjective:  Patient ID: Adrian Ray, male    DOB: [birth year],  MRN: [id number]  Chief Complaint  Patient presents with   Callouses    89 y.o. male presents with the above complaint.  Patient presents with left submetatarsal 2 porokeratotic lesion painful to touch is progressive and worsens with ambulation worse with pressure pain scale is 5 out of 10 sharp shooting in nature.  He would like to have it debrided and it does give him relief.     Review of Systems: Negative except as noted in the HPI. Denies N/V/F/Ch.  Past Medical History:  Diagnosis Date   Cataracts, bilateral    GERD (gastroesophageal reflux disease)    Hyperlipemia     Current Outpatient Medications:    acetaminophen (TYLENOL) 500 MG tablet, Take 1,000 mg by mouth every 8 (eight) hours., Disp: , Rfl:    cephALEXin (KEFLEX) 500 MG capsule, Take 500 mg by mouth 3 (three) times daily., Disp: , Rfl:    ciclopirox (PENLAC) 8 % solution, Apply topically at bedtime. Apply over nail and surrounding skin. Apply daily over previous coat. After seven (7) days, may remove with alcohol and continue cycle., Disp: 6.6 mL, Rfl: 0   ciprofloxacin (CIPRO) 250 MG tablet, Take 250 mg by mouth 2 (two) times daily., Disp: , Rfl:    finasteride (PROSCAR) 5 MG tablet, Take 5 mg by mouth daily., Disp: , Rfl:    fluticasone (FLONASE) 50 MCG/ACT nasal spray, Place 1 spray into both nostrils daily as needed for allergies., Disp: , Rfl:    mupirocin ointment (BACTROBAN) 2 %, SMARTSIG:1 Application Topical 2-3 Times Daily, Disp: , Rfl:    rosuvastatin (CRESTOR) 5 MG tablet, Take 1 tablet by mouth 3 (three) times a week. Monday Wednesday Friday, Disp: , Rfl:    tamsulosin (FLOMAX) 0.4 MG CAPS capsule, Take by mouth., Disp: , Rfl:   Social History   Tobacco Use  Smoking Status Never  Smokeless Tobacco Never    Allergies  Allergen Reactions   Tetracycline     Other reaction(s): Other (See Comments), Unknown Lips swelled    Codeine      Other reaction(s): Unknown   Objective:  There were no vitals filed for this visit. There is no height or weight on file to calculate BMI. Constitutional Well developed. Well nourished.  Vascular Dorsalis pedis pulses palpable bilaterally. Posterior tibial pulses palpable bilaterally. Capillary refill normal to all digits.  No cyanosis or clubbing noted. Pedal hair growth normal.  Neurologic Normal speech. Oriented to person, place, and time. Epicritic sensation to light touch grossly present bilaterally.  Dermatologic -Check lesion with central nucleated core noted left submetatarsal 2 pain on palpation.  No pinpoint bleeding noted upon debridement  Orthopedic: Normal joint ROM without pain or crepitus bilaterally. No visible deformities. No bony tenderness.   Radiographs: None Assessment:   No diagnosis found.  Plan:  Patient was evaluated and treated and all questions answered.  Left submetatarsal 2 porokeratosis -All questions and concerns were discussed with the patient in extensive detail -Given the amount of pain that he is having he will benefit from debridement of the lesion, as a courtesy using chisel blade to handle lesion with healthy dry tissue no complication noted no pinpoint bleeding noted. -I discussed shoe gear modification as well.  And offloading pads  No follow-ups on file.

## 2022-10-15 DIAGNOSIS — Z85820 Personal history of malignant melanoma of skin: Secondary | ICD-10-CM | POA: Diagnosis not present

## 2022-10-15 DIAGNOSIS — Z85828 Personal history of other malignant neoplasm of skin: Secondary | ICD-10-CM | POA: Diagnosis not present

## 2022-10-15 DIAGNOSIS — L570 Actinic keratosis: Secondary | ICD-10-CM | POA: Diagnosis not present

## 2022-10-15 DIAGNOSIS — L814 Other melanin hyperpigmentation: Secondary | ICD-10-CM | POA: Diagnosis not present

## 2022-10-15 DIAGNOSIS — D485 Neoplasm of uncertain behavior of skin: Secondary | ICD-10-CM | POA: Diagnosis not present

## 2022-10-15 DIAGNOSIS — L821 Other seborrheic keratosis: Secondary | ICD-10-CM | POA: Diagnosis not present

## 2022-10-22 DIAGNOSIS — H2513 Age-related nuclear cataract, bilateral: Secondary | ICD-10-CM | POA: Diagnosis not present

## 2022-11-19 ENCOUNTER — Ambulatory Visit (INDEPENDENT_AMBULATORY_CARE_PROVIDER_SITE_OTHER): Payer: Medicare Other | Admitting: Podiatry

## 2022-11-19 ENCOUNTER — Encounter: Payer: Self-pay | Admitting: Podiatry

## 2022-11-19 DIAGNOSIS — Q828 Other specified congenital malformations of skin: Secondary | ICD-10-CM

## 2022-11-19 DIAGNOSIS — N401 Enlarged prostate with lower urinary tract symptoms: Secondary | ICD-10-CM | POA: Insufficient documentation

## 2022-11-19 DIAGNOSIS — N400 Enlarged prostate without lower urinary tract symptoms: Secondary | ICD-10-CM | POA: Insufficient documentation

## 2022-11-19 NOTE — Progress Notes (Signed)
Subjective:  Patient ID: Adrian Ray, male    DOB: [birth year],  MRN: [id number]  Chief Complaint  Patient presents with   Callouses    Pt stated that the place on his foot is still sore     89 y.o. male presents with the above complaint.  Patient presents with left submetatarsal 2 porokeratotic lesion painful to touch is progressive and worsens with ambulation worse with pressure pain scale is 5 out of 10 sharp shooting in nature.  He would like to have it debrided and it does give him relief.     Review of Systems: Negative except as noted in the HPI. Denies N/V/F/Ch.  Past Medical History:  Diagnosis Date   Cataracts, bilateral    GERD (gastroesophageal reflux disease)    Hyperlipemia     Current Outpatient Medications:    ammonium lactate (LAC-HYDRIN FIVE) 5 % LOTN lotion, 1 application Externally Twice a day, Disp: , Rfl:    amoxicillin (AMOXIL) 500 MG tablet, Take by mouth., Disp: , Rfl:    aspirin EC 81 MG tablet, 1 tablet Orally Once a day, Disp: , Rfl:    chlorhexidine (PERIDEX) 0.12 % solution, SMARTSIG:By Mouth, Disp: , Rfl:    diclofenac Sodium (VOLTAREN) 1 % GEL, 1 application Externally Four times a day for 10 days, Disp: , Rfl:    traMADol (ULTRAM) 50 MG tablet, Take by mouth., Disp: , Rfl:    acetaminophen (TYLENOL) 500 MG tablet, Take 1,000 mg by mouth every 8 (eight) hours., Disp: , Rfl:    cephALEXin (KEFLEX) 500 MG capsule, Take 500 mg by mouth 3 (three) times daily., Disp: , Rfl:    ciclopirox (PENLAC) 8 % solution, Apply topically at bedtime. Apply over nail and surrounding skin. Apply daily over previous coat. After seven (7) days, may remove with alcohol and continue cycle., Disp: 6.6 mL, Rfl: 0   ciprofloxacin (CIPRO) 250 MG tablet, Take 250 mg by mouth 2 (two) times daily., Disp: , Rfl:    Cyanocobalamin 1000 MCG TBCR, 0.5 tablet Orally Once a day, Disp: , Rfl:    finasteride (PROSCAR) 5 MG tablet, Take 5 mg by mouth daily., Disp: , Rfl:    fluticasone  (FLONASE) 50 MCG/ACT nasal spray, Place 1 spray into both nostrils daily as needed for allergies., Disp: , Rfl:    mupirocin ointment (BACTROBAN) 2 %, SMARTSIG:1 Application Topical 2-3 Times Daily, Disp: , Rfl:    rosuvastatin (CRESTOR) 5 MG tablet, Take 1 tablet by mouth 3 (three) times a week. Monday Wednesday Friday, Disp: , Rfl:    tamsulosin (FLOMAX) 0.4 MG CAPS capsule, Take by mouth., Disp: , Rfl:   Social History   Tobacco Use  Smoking Status Never  Smokeless Tobacco Never    Allergies  Allergen Reactions   Tetracycline     Other reaction(s): Other (See Comments), Unknown Lips swelled    Codeine     Other reaction(s): Unknown   Objective:  There were no vitals filed for this visit. There is no height or weight on file to calculate BMI. Constitutional Well developed. Well nourished.  Vascular Dorsalis pedis pulses palpable bilaterally. Posterior tibial pulses palpable bilaterally. Capillary refill normal to all digits.  No cyanosis or clubbing noted. Pedal hair growth normal.  Neurologic Normal speech. Oriented to person, place, and time. Epicritic sensation to light touch grossly present bilaterally.  Dermatologic -Check lesion with central nucleated core noted left submetatarsal 2 pain on palpation.  No pinpoint bleeding noted upon debridement  Orthopedic: Normal joint ROM without pain or crepitus bilaterally. No visible deformities. No bony tenderness.   Radiographs: None Assessment:   1. Porokeratosis     Plan:  Patient was evaluated and treated and all questions answered.  Left submetatarsal 2 porokeratosis -All questions and concerns were discussed with the patient in extensive detail -Given the amount of pain that he is having he will benefit from debridement of the lesion, as a courtesy using chisel blade to handle lesion with healthy dry tissue no complication noted no pinpoint bleeding noted. -I discussed shoe gear modification as well.  And  offloading pads  No follow-ups on file.

## 2022-12-10 DIAGNOSIS — J310 Chronic rhinitis: Secondary | ICD-10-CM | POA: Diagnosis not present

## 2022-12-10 DIAGNOSIS — J342 Deviated nasal septum: Secondary | ICD-10-CM | POA: Diagnosis not present

## 2022-12-10 DIAGNOSIS — J343 Hypertrophy of nasal turbinates: Secondary | ICD-10-CM | POA: Diagnosis not present

## 2022-12-10 DIAGNOSIS — H903 Sensorineural hearing loss, bilateral: Secondary | ICD-10-CM | POA: Diagnosis not present

## 2023-01-05 DIAGNOSIS — L821 Other seborrheic keratosis: Secondary | ICD-10-CM | POA: Diagnosis not present

## 2023-01-05 DIAGNOSIS — L905 Scar conditions and fibrosis of skin: Secondary | ICD-10-CM | POA: Diagnosis not present

## 2023-01-05 DIAGNOSIS — B078 Other viral warts: Secondary | ICD-10-CM | POA: Diagnosis not present

## 2023-01-05 DIAGNOSIS — L820 Inflamed seborrheic keratosis: Secondary | ICD-10-CM | POA: Diagnosis not present

## 2023-01-05 DIAGNOSIS — Z85820 Personal history of malignant melanoma of skin: Secondary | ICD-10-CM | POA: Diagnosis not present

## 2023-01-05 DIAGNOSIS — L570 Actinic keratosis: Secondary | ICD-10-CM | POA: Diagnosis not present

## 2023-01-05 DIAGNOSIS — Z85828 Personal history of other malignant neoplasm of skin: Secondary | ICD-10-CM | POA: Diagnosis not present

## 2023-02-18 DIAGNOSIS — L821 Other seborrheic keratosis: Secondary | ICD-10-CM | POA: Diagnosis not present

## 2023-02-18 DIAGNOSIS — Z85828 Personal history of other malignant neoplasm of skin: Secondary | ICD-10-CM | POA: Diagnosis not present

## 2023-02-18 DIAGNOSIS — Z85820 Personal history of malignant melanoma of skin: Secondary | ICD-10-CM | POA: Diagnosis not present

## 2023-02-25 DIAGNOSIS — J309 Allergic rhinitis, unspecified: Secondary | ICD-10-CM | POA: Diagnosis not present

## 2023-02-25 DIAGNOSIS — R059 Cough, unspecified: Secondary | ICD-10-CM | POA: Diagnosis not present

## 2023-03-31 ENCOUNTER — Other Ambulatory Visit: Payer: Self-pay | Admitting: Internal Medicine

## 2023-03-31 ENCOUNTER — Ambulatory Visit
Admission: RE | Admit: 2023-03-31 | Discharge: 2023-03-31 | Disposition: A | Discharge status: Home / Self Care | Payer: Medicare Other | Source: Ambulatory Visit | Attending: Internal Medicine | Admitting: Internal Medicine

## 2023-03-31 DIAGNOSIS — R053 Chronic cough: Secondary | ICD-10-CM

## 2023-03-31 DIAGNOSIS — E7800 Pure hypercholesterolemia, unspecified: Secondary | ICD-10-CM | POA: Diagnosis not present

## 2023-03-31 DIAGNOSIS — Z0000 Encounter for general adult medical examination without abnormal findings: Secondary | ICD-10-CM | POA: Diagnosis not present

## 2023-03-31 DIAGNOSIS — G4700 Insomnia, unspecified: Secondary | ICD-10-CM | POA: Diagnosis not present

## 2023-03-31 DIAGNOSIS — H903 Sensorineural hearing loss, bilateral: Secondary | ICD-10-CM | POA: Diagnosis not present

## 2023-03-31 DIAGNOSIS — Z1331 Encounter for screening for depression: Secondary | ICD-10-CM | POA: Diagnosis not present

## 2023-03-31 DIAGNOSIS — Z23 Encounter for immunization: Secondary | ICD-10-CM | POA: Diagnosis not present

## 2023-03-31 DIAGNOSIS — R413 Other amnesia: Secondary | ICD-10-CM | POA: Diagnosis not present

## 2023-03-31 DIAGNOSIS — E871 Hypo-osmolality and hyponatremia: Secondary | ICD-10-CM | POA: Diagnosis not present

## 2023-03-31 DIAGNOSIS — N401 Enlarged prostate with lower urinary tract symptoms: Secondary | ICD-10-CM | POA: Diagnosis not present

## 2023-03-31 DIAGNOSIS — E538 Deficiency of other specified B group vitamins: Secondary | ICD-10-CM | POA: Diagnosis not present

## 2023-03-31 DIAGNOSIS — R058 Other specified cough: Secondary | ICD-10-CM | POA: Diagnosis not present

## 2023-04-08 DIAGNOSIS — E538 Deficiency of other specified B group vitamins: Secondary | ICD-10-CM | POA: Diagnosis not present

## 2023-04-08 DIAGNOSIS — L570 Actinic keratosis: Secondary | ICD-10-CM | POA: Diagnosis not present

## 2023-04-08 DIAGNOSIS — Z85828 Personal history of other malignant neoplasm of skin: Secondary | ICD-10-CM | POA: Diagnosis not present

## 2023-04-08 DIAGNOSIS — L821 Other seborrheic keratosis: Secondary | ICD-10-CM | POA: Diagnosis not present

## 2023-04-08 DIAGNOSIS — D485 Neoplasm of uncertain behavior of skin: Secondary | ICD-10-CM | POA: Diagnosis not present

## 2023-04-08 DIAGNOSIS — Z85820 Personal history of malignant melanoma of skin: Secondary | ICD-10-CM | POA: Diagnosis not present

## 2023-04-08 DIAGNOSIS — L430 Hypertrophic lichen planus: Secondary | ICD-10-CM | POA: Diagnosis not present

## 2023-05-03 ENCOUNTER — Encounter: Payer: Self-pay | Admitting: Internal Medicine

## 2023-05-03 ENCOUNTER — Ambulatory Visit (INDEPENDENT_AMBULATORY_CARE_PROVIDER_SITE_OTHER): Payer: Medicare Other | Admitting: Internal Medicine

## 2023-05-03 VITALS — BP 137/54 | HR 53 | Temp 97.5°F | Ht 75.0 in | Wt 184.0 lb

## 2023-05-03 DIAGNOSIS — R911 Solitary pulmonary nodule: Secondary | ICD-10-CM

## 2023-05-03 DIAGNOSIS — R0602 Shortness of breath: Secondary | ICD-10-CM

## 2023-05-03 DIAGNOSIS — R9389 Abnormal findings on diagnostic imaging of other specified body structures: Secondary | ICD-10-CM

## 2023-05-03 DIAGNOSIS — R0989 Other specified symptoms and signs involving the circulatory and respiratory systems: Secondary | ICD-10-CM

## 2023-05-03 DIAGNOSIS — R053 Chronic cough: Secondary | ICD-10-CM

## 2023-05-03 DIAGNOSIS — H9190 Unspecified hearing loss, unspecified ear: Secondary | ICD-10-CM

## 2023-05-03 LAB — BASIC METABOLIC PANEL
BUN: 11 mg/dL (ref 6–23)
CO2: 27 meq/L (ref 19–32)
Calcium: 9.2 mg/dL (ref 8.4–10.5)
Chloride: 98 meq/L (ref 96–112)
Creatinine, Ser: 0.83 mg/dL (ref 0.40–1.50)
GFR: 78.71 mL/min (ref >60.00)
Glucose, Bld: 88 mg/dL (ref 70–99)
Potassium: 4.4 meq/L (ref 3.5–5.1)
Sodium: 131 meq/L — ABNORMAL LOW (ref 135–145)

## 2023-05-03 LAB — HEPATIC FUNCTION PANEL
ALT: 21 U/L (ref 0–53)
AST: 25 U/L (ref 0–37)
Albumin: 4.3 g/dL (ref 3.5–5.2)
Alkaline Phosphatase: 96 U/L (ref 39–117)
Bilirubin, Direct: 0.2 mg/dL (ref 0.0–0.3)
Total Bilirubin: 0.8 mg/dL (ref 0.2–1.2)
Total Protein: 7.7 g/dL (ref 6.0–8.3)

## 2023-05-03 LAB — CBC WITH DIFFERENTIAL/PLATELET
Basophils Absolute: 0.1 10*3/uL (ref 0.0–0.1)
Basophils Relative: 0.9 % (ref 0.0–3.0)
Eosinophils Absolute: 0.3 10*3/uL (ref 0.0–0.7)
Eosinophils Relative: 4.2 % (ref 0.0–5.0)
HCT: 46.5 % (ref 39.0–52.0)
Hemoglobin: 15.2 g/dL (ref 13.0–17.0)
Lymphocytes Relative: 23.6 % (ref 12.0–46.0)
Lymphs Abs: 1.4 10*3/uL (ref 0.7–4.0)
MCHC: 32.7 g/dL (ref 30.0–36.0)
MCV: 98.8 fL (ref 78.0–100.0)
Monocytes Absolute: 0.7 10*3/uL (ref 0.1–1.0)
Monocytes Relative: 10.8 % (ref 3.0–12.0)
Neutro Abs: 3.7 10*3/uL (ref 1.4–7.7)
Neutrophils Relative %: 60.5 % (ref 43.0–77.0)
Platelets: 239 10*3/uL (ref 150.0–400.0)
RBC: 4.7 Mil/uL (ref 4.22–5.81)
RDW: 14.3 % (ref 11.5–15.5)
WBC: 6 10*3/uL (ref 4.0–10.5)

## 2023-05-03 LAB — BRAIN NATRIURETIC PEPTIDE: Pro B Natriuretic peptide (BNP): 37 pg/mL (ref 0.0–100.0)

## 2023-05-03 LAB — SEDIMENTATION RATE: Sed Rate: 7 mm/h (ref 0–20)

## 2023-05-03 NOTE — Progress Notes (Addendum)
OV 05/03/2023  Subjective:  Patient ID: Adrian Ray, male , DOB: June 09, 1934 , age 89 y.o. , MRN: [id number] , ADDRESS: [address] [place] [address] PCP Marden Noble, MD Patient Care Team: Marden Noble, MD as PCP - General (Internal Medicine)  This Provider for this visit: Treatment Team:  Attending Provider: Waymon Budge, MD    05/03/2023 -   Chief Complaint  Patient presents with   Pulmonary Consult    Referred by Dr. Christene Slates. Pt c/o cough- non prod over the past couple of years.      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  He only smoked for 1 year between age of 24 and 64.  He still is active.  He plays golf and he can swims and plays tennis when he can.  Although overall he does have generalized weakness he says.  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  He has frequent urination but is not on Macrodantin.  He is here by himself.  He says has been referred because of abnormal chest x-ray.  He admits to mild amount of chronic dry cough for the last 2 years it is stable.  It is mostly in the evenings and at night.  But there is no associated wheezing or shortness of breath.  There is no orthopnea or paroxysmal nocturnal dyspnea.  Visualization of the chest x-ray does show interstitial abnormalities in the lung base.  He had a CT chest 2 years ago that I personally visualized and he does have pulmonary infiltrates in the bilateral lung base.  It does not form a specific ILD pattern.  He is worried if there is something serious at this point in time I have told him that I cannot make any determination on prognosis.  I think doing pulmonary function test can be hard for him because of his work processing issues.  In the past his blood eosinophils have been normal.     LAB RESULTS last 96 hours No results found.  LAB  RESULTS last 90 days No results found for this or any previous visit (from the past 2160 hour(s)).      Latest Reference Range & Units 05/08/10 22:20 10/11/20 10:52  Eosinophils Absolute 0.0 - 0.5 K/uL 0.0 0.1    has a past medical history of Cataracts, bilateral, GERD (gastroesophageal reflux disease), and Hyperlipemia.   reports that he has never smoked. He has never used smokeless tobacco.  Past Surgical History:  Procedure Laterality Date   APPENDECTOMY      Allergies  Allergen Reactions   Tetracycline     Other reaction(s): Other (See Comments), Unknown Lips swelled    Codeine     Other reaction(s): Unknown    Immunization History  Administered Date(s) Administered   Tdap 02/18/2017, 10/02/2020    No family history on file.   Current Outpatient Medications:    acetaminophen (TYLENOL) 500 MG tablet, Take 1,000 mg by mouth every 8 (eight) hours., Disp: , Rfl:    finasteride (PROSCAR) 5 MG tablet, Take 5 mg by mouth daily., Disp: , Rfl:    rosuvastatin (CRESTOR) 5 MG tablet, Take 1 tablet by mouth 3 (three) times a week. Monday Wednesday Friday, Disp: , Rfl:    ammonium lactate (LAC-HYDRIN FIVE) 5 % LOTN lotion, 1 application Externally Twice  a day (Patient not taking: Reported on 05/03/2023), Disp: , Rfl:    amoxicillin (AMOXIL) 500 MG tablet, Take by mouth. (Patient not taking: Reported on 05/03/2023), Disp: , Rfl:    aspirin EC 81 MG tablet, 1 tablet Orally Once a day (Patient not taking: Reported on 05/03/2023), Disp: , Rfl:    cephALEXin (KEFLEX) 500 MG capsule, Take 500 mg by mouth 3 (three) times daily. (Patient not taking: Reported on 05/03/2023), Disp: , Rfl:    chlorhexidine (PERIDEX) 0.12 % solution, SMARTSIG:By Mouth (Patient not taking: Reported on 05/03/2023), Disp: , Rfl:    ciclopirox (PENLAC) 8 % solution, Apply topically at bedtime. Apply over nail and surrounding skin. Apply daily over previous coat. After seven (7) days, may remove with alcohol and  continue cycle. (Patient not taking: Reported on 05/03/2023), Disp: 6.6 mL, Rfl: 0   Cyanocobalamin 1000 MCG TBCR, 0.5 tablet Orally Once a day (Patient not taking: Reported on 05/03/2023), Disp: , Rfl:    diclofenac Sodium (VOLTAREN) 1 % GEL, 1 application Externally Four times a day for 10 days (Patient not taking: Reported on 05/03/2023), Disp: , Rfl:    fluticasone (FLONASE) 50 MCG/ACT nasal spray, Place 1 spray into both nostrils daily as needed for allergies. (Patient not taking: Reported on 05/03/2023), Disp: , Rfl:    mupirocin ointment (BACTROBAN) 2 %, SMARTSIG:1 Application Topical 2-3 Times Daily (Patient not taking: Reported on 05/03/2023), Disp: , Rfl:    tamsulosin (FLOMAX) 0.4 MG CAPS capsule, Take by mouth. (Patient not taking: Reported on 05/03/2023), Disp: , Rfl:    traMADol (ULTRAM) 50 MG tablet, Take by mouth. (Patient not taking: Reported on 05/03/2023), Disp: , Rfl:       Objective:   Vitals:   05/03/23 0837  BP: (!) 137/54  Pulse: (!) 53  Temp: (!) 97.5 F (36.4 C)  TempSrc: Oral  SpO2: 97%  Weight: 184 lb (83.5 kg)  Height: 6\' 3"  (1.905 m)    Estimated body mass index is 23 kg/m as calculated from the following:   Height as of this encounter: 6\' 3"  (1.905 m).   Weight as of this encounter: 184 lb (83.5 kg).  @WEIGHTCHANGE @  [employer]   05/03/23 0837  Weight: 184 lb (83.5 kg)     Physical Exam   General: No distress. Lean . Looks well O2 at rest: no Cane present: no Sitting in wheel chair: no Frail: no Obese: no Neuro: Alert and Oriented x 3. GCS 15. Speech normal Psych: Pleasant Resp:  Barrel Chest - no.  Wheeze - no, Crackles - YES BASE, No overt respiratory distress CVS: Normal heart sounds. Murmurs - no Ext: Stigmata of Connective Tissue Disease - no SKINL TAN + HEENT: Normal upper airway. PEERL +. No post nasal drip. HEARRING AID +        Assessment:       ICD-10-CM   1. Chronic cough  R05.3 CT Chest High Resolution    2. Abnormal  chest x-ray  R93.89 CT Chest High Resolution    3. Abnormal CT of the chest  R93.89     4. Nodule of upper lobe of right lung  R91.1     5. Bibasilar crackles  R09.89     6. 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 Chronic cough  R05.3 CT Chest High Resolution    2. Abnormal chest  x-ray  R93.89 CT Chest High Resolution    3. Abnormal CT of the chest  R93.89     4. Nodule of upper lobe of right lung  R91.1     5. Bibasilar crackles  R09.89     6. 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  This can explain the abnormal chest x-ray which is described as "interstitial abnormality".  At this point in time we are in the stage of investigation and so I do not know how serious this is or what exactly it is.  Plan - Do CBC differential, blood IgE and RAST allergy panel - Do blood work for QuantiFERON gold, ESR, ANA, rheumatoid factor, CCP - Do cbc, bmet, LFT, and BNP -Do high-resolution CT chest supine and prone -Take ILD questionnaire with you home and bring it back after you fill it up -Holding off on spirometry test because of complex maneuvers which can be difficult for you with your Ray difficulty  Follow-up -Next 3-6 weeks to see Dr. Marchelle Gearing to discuss test results   FOLLOWUP Return in about 5 weeks (around 06/07/2023) for 30 min visit, with Dr Marchelle Gearing, Face to Face Visit.    SIGNATURE    Dr. Kalman Shan, M.D., F.C.C.P,  Pulmonary and Critical Care Medicine Staff Physician, [facility] Director - Interstitial Lung Disease  Program  Pulmonary Fibrosis [facility] [place], [place], [address]  Pager: [phone], If no answer or between  15:00h - 7:00h: call [phone] Telephone: [phone]  9:06 AM 05/03/2023

## 2023-05-03 NOTE — Addendum Note (Signed)
Addended by: Christen Butter on: 05/03/2023 09:15 AM   Modules accepted: Orders

## 2023-05-03 NOTE — Patient Instructions (Addendum)
ICD-10-CM   1. Chronic cough  R05.3 CT Chest High Resolution    2. Abnormal chest x-ray  R93.89 CT Chest High Resolution    3. Abnormal CT of the chest  R93.89     4. Nodule of upper lobe of right lung  R91.1     5. Bibasilar crackles  R09.89     6. Hard of hearing  H91.90       Your chronic cough might be related to crackles are here on exam in the bottom of the lungs.  This can explain the abnormal chest x-ray which is described as "interstitial abnormality".  At this point in time we are in the stage of investigation and so I do not know how serious this is or what exactly it is.  Plan - Do CBC differential, blood IgE and RAST allergy panel - Do blood work for QuantiFERON gold, ESR, ANA, rheumatoid factor, CCP - Do cbc, bmet, LFT, and BNP -Do high-resolution CT chest supine and prone -Take ILD questionnaire with you home and bring it back after you fill it up -Holding off on spirometry test because of complex maneuvers which can be difficult for you with your hearing difficulty  Follow-up -Next 3-6 weeks to see Dr. Marchelle Gearing to discuss test results

## 2023-05-04 ENCOUNTER — Institutional Professional Consult (permissible substitution): Payer: Medicare Other | Admitting: Internal Medicine

## 2023-05-05 LAB — RESPIRATORY ALLERGY PROFILE REGION II ~~LOC~~
Allergen, A. alternata, m6: 1.49 kU/L — ABNORMAL HIGH
Allergen, Cedar tree, t12: 0.15 kU/L — ABNORMAL HIGH
Allergen, Comm Silver Birch, t9: 0.17 kU/L — ABNORMAL HIGH
Allergen, Cottonwood, t14: 0.16 kU/L — ABNORMAL HIGH
Allergen, D pternoyssinus,d7: 2.59 kU/L — ABNORMAL HIGH
Allergen, Mouse Urine Protein, e78: <0.1 kU/L
Allergen, Mulberry, t76: <0.1 kU/L
Allergen, Oak,t7: 0.13 kU/L — ABNORMAL HIGH
Allergen, P. notatum, m1: <0.1 kU/L
Aspergillus fumigatus, m3: 0.13 kU/L — ABNORMAL HIGH
Bermuda Grass: 0.16 kU/L — ABNORMAL HIGH
Box Elder IgE: 0.28 kU/L — ABNORMAL HIGH
CLADOSPORIUM HERBARUM (M2) IGE: 0.2 kU/L — ABNORMAL HIGH
COMMON RAGWEED (SHORT) (W1) IGE: 1.93 kU/L — ABNORMAL HIGH
Cat Dander: 1.47 kU/L — ABNORMAL HIGH
Class: 0
Class: 0
Class: 0
Class: 0
Class: 1
Class: 1
Class: 2
Class: 2
Class: 2
Class: 2
Class: 3
Class: 3
Class: 4
Cockroach: 0.17 kU/L — ABNORMAL HIGH
D. farinae: 8.24 kU/L — ABNORMAL HIGH
Dog Dander: 0.44 kU/L — ABNORMAL HIGH
Elm IgE: 0.63 kU/L — ABNORMAL HIGH
IgE (Immunoglobulin E), Serum: 1140 kU/L — ABNORMAL HIGH (ref <=114)
Johnson Grass: 0.13 kU/L — ABNORMAL HIGH
Pecan/Hickory Tree IgE: 5.61 kU/L — ABNORMAL HIGH
Rough Pigweed  IgE: 0.15 kU/L — ABNORMAL HIGH
Sheep Sorrel IgE: <0.1 kU/L
Timothy Grass: 17.6 kU/L — ABNORMAL HIGH

## 2023-05-05 LAB — DOG DANDER COMPONENT
Can f 4(e229) IgE: <0.1 kU/L (ref <0.10)
Can f 6(e230) IgE: <0.1 kU/L (ref <0.10)
E101-IgE Can f 1: <0.1 kU/L (ref <0.10)
E102-IgE Can f 2: <0.1 kU/L (ref <0.10)
E221-IgE Can f 3: <0.1 kU/L (ref <0.10)
E226-IgE Can f 5: <0.1 kU/L (ref <0.10)

## 2023-05-05 LAB — QUANTIFERON-TB GOLD PLUS
Mitogen-NIL: 5.22 [IU]/mL
NIL: 0.02 [IU]/mL
QuantiFERON-TB Gold Plus: NEGATIVE
TB1-NIL: 0.04 [IU]/mL
TB2-NIL: 0.05 [IU]/mL

## 2023-05-05 LAB — CAT DANDER COMPONENT
E220-IgE Fel d 2: <0.1 kU/L (ref <0.10)
E228-IgE Fel d 4: <0.1 kU/L (ref <0.10)
Fel d 1 (e94) IgE: 2.02 kU/L — ABNORMAL HIGH (ref <0.10)
Fel d 7 (e231) IgE: <0.1 kU/L (ref <0.10)

## 2023-05-05 LAB — CYCLIC CITRUL PEPTIDE ANTIBODY, IGG: Cyclic Citrullin Peptide Ab: <16 U

## 2023-05-05 LAB — RHEUMATOID FACTOR: Rheumatoid fact SerPl-aCnc: <10 [IU]/mL (ref <14)

## 2023-05-05 LAB — ANA: Anti Nuclear Antibody (ANA): NEGATIVE

## 2023-05-10 DIAGNOSIS — E538 Deficiency of other specified B group vitamins: Secondary | ICD-10-CM | POA: Diagnosis not present

## 2023-05-12 ENCOUNTER — Ambulatory Visit
Admission: RE | Admit: 2023-05-12 | Discharge: 2023-05-12 | Disposition: A | Discharge status: Home / Self Care | Payer: Medicare Other | Source: Ambulatory Visit | Attending: Internal Medicine | Admitting: Internal Medicine

## 2023-05-12 DIAGNOSIS — I700 Atherosclerosis of aorta: Secondary | ICD-10-CM | POA: Diagnosis not present

## 2023-05-12 DIAGNOSIS — I2510 Atherosclerotic heart disease of native coronary artery without angina pectoris: Secondary | ICD-10-CM | POA: Diagnosis not present

## 2023-05-12 DIAGNOSIS — R9389 Abnormal findings on diagnostic imaging of other specified body structures: Secondary | ICD-10-CM

## 2023-05-12 DIAGNOSIS — R053 Chronic cough: Secondary | ICD-10-CM

## 2023-05-12 DIAGNOSIS — J479 Bronchiectasis, uncomplicated: Secondary | ICD-10-CM | POA: Diagnosis not present

## 2023-05-12 DIAGNOSIS — R918 Other nonspecific abnormal finding of lung field: Secondary | ICD-10-CM | POA: Diagnosis not present

## 2023-05-31 ENCOUNTER — Ambulatory Visit (INDEPENDENT_AMBULATORY_CARE_PROVIDER_SITE_OTHER): Payer: Medicare Other | Admitting: Internal Medicine

## 2023-05-31 ENCOUNTER — Encounter: Payer: Self-pay | Admitting: Internal Medicine

## 2023-05-31 VITALS — BP 112/60 | HR 61 | Ht 75.0 in | Wt 188.4 lb

## 2023-05-31 DIAGNOSIS — J3081 Allergic rhinitis due to animal (cat) (dog) hair and dander: Secondary | ICD-10-CM | POA: Diagnosis not present

## 2023-05-31 DIAGNOSIS — Z9109 Other allergy status, other than to drugs and biological substances: Secondary | ICD-10-CM

## 2023-05-31 DIAGNOSIS — R9389 Abnormal findings on diagnostic imaging of other specified body structures: Secondary | ICD-10-CM | POA: Diagnosis not present

## 2023-05-31 DIAGNOSIS — R0989 Other specified symptoms and signs involving the circulatory and respiratory systems: Secondary | ICD-10-CM | POA: Diagnosis not present

## 2023-05-31 DIAGNOSIS — J479 Bronchiectasis, uncomplicated: Secondary | ICD-10-CM

## 2023-05-31 DIAGNOSIS — L299 Pruritus, unspecified: Secondary | ICD-10-CM

## 2023-05-31 DIAGNOSIS — D721 Eosinophilia, unspecified: Secondary | ICD-10-CM

## 2023-05-31 DIAGNOSIS — H9190 Unspecified hearing loss, unspecified ear: Secondary | ICD-10-CM | POA: Diagnosis not present

## 2023-05-31 DIAGNOSIS — R768 Other specified abnormal immunological findings in serum: Secondary | ICD-10-CM | POA: Diagnosis not present

## 2023-05-31 DIAGNOSIS — R053 Chronic cough: Secondary | ICD-10-CM

## 2023-05-31 MED ORDER — FLUTICASONE FUROATE-VILANTEROL 100-25 MCG/ACT IN AEPB: 1.0000 | INHALATION_SPRAY | Freq: Every day | RESPIRATORY_TRACT | 11 refills | Status: DC

## 2023-05-31 MED ORDER — ALBUTEROL SULFATE HFA 108 (90 BASE) MCG/ACT IN AERS: 2.0000 | INHALATION_SPRAY | Freq: Four times a day (QID) | RESPIRATORY_TRACT | 2 refills | Status: DC | PRN

## 2023-05-31 NOTE — Progress Notes (Signed)
OV 05/03/2023  Subjective:  Patient ID: Adrian Ray, male , DOB: [birth year] , age 89 y.o. , MRN: [id number] , ADDRESS: [address] [place] [address] PCP Marden Noble, MD Patient Care Team: Marden Noble, MD as PCP - General (Internal Medicine)  This Provider for this visit: Treatment Team:  Attending Provider: Waymon Budge, MD    05/03/2023 -   Chief Complaint  Patient presents with   Pulmonary Consult    Referred by Dr. Christene Slates. Pt c/o cough- non prod over the past couple of years.      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  He only smoked for 1 year between age of 36 and 64.  He still is active.  He plays golf and he can swims and plays tennis when he can.  Although overall he does have generalized weakness he says.  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  He has frequent urination but is not on Macrodantin.  He is here by himself.  He says has been referred because of abnormal chest x-ray.  He admits to mild amount of chronic dry cough for the last 2 years it is stable.  It is mostly in the evenings and at night.  But there is no associated wheezing or shortness of breath.  There is no orthopnea or paroxysmal nocturnal dyspnea.  Visualization of the chest x-ray does show interstitial abnormalities in the lung base.  He had a CT chest 2 years ago that I personally visualized and he does have pulmonary infiltrates in the bilateral lung base.  It does not form a specific ILD pattern.  He is worried if there is something serious at this point in time I have told him that I cannot make any determination on prognosis.  I think doing pulmonary function test can be hard for him because of his work processing issues.  In the past his blood eosinophils have been normal.    OV 05/31/2023  Subjective:  Patient ID: Adrian Ray, male  , DOB: 07/02/1934 , age 62 y.o. , MRN: [id number] , ADDRESS: [address] [place] [address] PCP Marden Noble, MD (Inactive) Patient Care Team: Marden Noble, MD (Inactive) as PCP - General (Internal Medicine)  This Provider for this visit: Treatment Team:  Attending Provider: Kalman Shan, MD    05/31/2023 -   Chief Complaint  Patient presents with   Follow-up    Breathing is unchanged.      HPI Adrian Ray 89 y.o. -returns for follow-up after completing the workup.  ILD was the suspicion but the CT scan actually shows a lot of nodularity with broncheictasiss  in the lung base.  In addition [facility] allergy panel is significantly abnormal with elevated IgE greater than 1000 and significant allergy for external agent such as cat dander, dog dander dust mite and significant amount of grass and plants but is basic autoimmune serology for ANA rheumatoid factor and CCP is negative.Marland Kitchen  He is mildly positive Aspergillus fumigatus as well.  Almost as a feel of ABPA except that infiltrates and bronchiectasis are in the lower lobe. HIs blod EOS is slight highly at 300cc/cum mm.  He presents here today to discuss these results.   Since his last visit he has no new problems but he does admit to having chronic  itching for which she sees Dr. Karlyn Agee.  He is not sure if a formal diagnosis of eczema has been given to him.  I attempted to review his external medical records on dermatology but could not located within care everywhere.  He did fill the ILD questionnaire and the synopsis from that is that he is at chronic cough for the last 2 years is roughly the same.  He does bring some light gray phlegm.  He does cough when he lies down but there is no tickle or clearing of the throat.  His cough is considered marginal.  There is no fatigue.  No nausea no vomiting no diarrhea no anxiety no depression no chronic pain.  He does not have any connective tissue disease no acid reflux.  No diabetes no  seizures no prior history of asthma no tuberculosis.  No pneumonia no blood clots no heart disease  He has had the COVID-vaccine but has also had COVID disease.  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  His skin lesions appears scaly  No family history of lung disease  Personal exposure history: No cigarette smoking no marijuana no vaping  Home and hobby details: [place] 9 years and a 51-year-old home.  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 High resolution imaging of the lungs, as well as inspiratory and expiratory imaging, was performed.   RADIATION DOSE REDUCTION: This exam was performed according to the departmental dose-optimization program which includes automated exposure control, adjustment of the mA and/or kV according to patient size and/or use of iterative reconstruction technique.   COMPARISON:  CTA chest dated October 22, 2020   FINDINGS: Cardiovascular: Normal heart size. No pericardial effusion. Normal caliber thoracic aorta with moderate calcified plaque. Severe coronary artery calcifications.   Mediastinum/Nodes: Esophagus and thyroid are unremarkable. No enlarged lymph nodes seen  in the chest.   Lungs/Pleura: Central airways are patent. No evidence of air trapping, although expiratory phase imaging is technically limited. Bilateral bronchiectasis and pulmonary nodules. New area of focal bronchial wall thickening, bronchiectasis and nodularity of the right lung apex. Additional scattered new solid pulmonary nodules are seen bilaterally. Reference new nodule of the posterior right upper lobe measuring 7 mm on series 11, image 36. 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 No pleural effusion or pneumothorax.   Upper Abdomen: Bilateral simple appearing renal cysts. No enlarged lymph nodes seen in the abdomen.   Musculoskeletal: Chronic osseous deformity of the sternum. No aggressive appearing osseous lesions.   IMPRESSION: 1. Bilateral bronchiectasis and pulmonary nodules, worsened when compared with 2022 prior with new areas of bronchiectasis and new pulmonary nodules seen on today's exam. Findings are likely due  to chronic atypical infection, such as non tuberculous mycobacterial. Recommend follow-up in 3-6 months. 2. 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 3. Severe coronary artery calcifications. 4. Aortic Atherosclerosis (ICD10-I70.0).     Electronically Signed   By: Allegra Lai M.D.   On: 05/28/2023 14:44   PFT      No data to display            LAB RESULTS last 96 hours No results found.  LAB RESULTS last 90 days Recent Results (from the past 2160 hour(s))  Cyclic citrul peptide antibody, IgG     Status: None   Collection Time: 05/03/23  9:16 AM  Result Value Ref Range   Cyclic Citrullin Peptide Ab <28 UNITS    Comment: Reference Range Negative:            <20 Weak Positive:       20-39 Moderate Positive:   40-59 Strong Positive:     >59 .   Rheumatoid Factor     Status: None   Collection Time: 05/03/23  9:16 AM  Result Value Ref  Range   Rheumatoid fact SerPl-aCnc <10 <14 IU/mL  Antinuclear Antib (ANA)     Status: None   Collection Time: 05/03/23  9:16 AM  Result Value Ref Range   Anti Nuclear Antibody (ANA) NEGATIVE NEGATIVE    Comment: ANA IFA is a first line screen for detecting the presence of up to approximately 150 autoantibodies in various autoimmune diseases. A negative ANA IFA result suggests an ANA-associated autoimmune disease is not present at this time, but is not definitive. If there is high clinical suspicion for Sjogren's syndrome, testing for anti-SS-A/Ro antibody should be considered. Anti-Jo-1 antibody should be considered for clinically suspected inflammatory myopathies. . AC-0: Negative . International Consensus on ANA Patterns (SeverTies.uy) . For additional information, please refer to http://education.[facility].com/faq/FAQ177 (This link is being provided for informational/ educational purposes only.) .   Sed Rate (ESR)     Status: None   Collection Time: 05/03/23  9:16 AM  Result Value Ref Range   Sed Rate 7 0 - 20 mm/hr  QuantiFERON-TB Gold Plus     Status: None   Collection Time: 05/03/23  9:16 AM  Result Value Ref Range   QuantiFERON-TB Gold Plus NEGATIVE NEGATIVE    Comment: Negative test result. M. tuberculosis complex  infection unlikely.    NIL 0.02 IU/mL   Mitogen-NIL 5.22 IU/mL   TB1-NIL 0.04 IU/mL   TB2-NIL 0.05 IU/mL    Comment: . The Nil tube value reflects the background interferon gamma immune response of the patient's blood sample. This value has been subtracted from the patient's displayed TB and Mitogen results. . Lower than expected results with the Mitogen tube prevent false-negative Quantiferon readings by detecting a patient with a potential immune suppressive condition and/or suboptimal pre-analytical specimen handling. . The TB1 Antigen tube is coated with the M. tuberculosis-specific antigens designed to  elicit responses from TB antigen primed CD4+ helper T-lymphocytes. . The TB2 Antigen tube is coated with the M. tuberculosis-specific antigens designed to elicit responses from TB antigen primed CD4+ helper and CD8+ cytotoxic T-lymphocytes. . For additional information, please refer to https://education.[facility].com/faq/FAQ204 (This link is being provided for informational/ educational purposes only.) .   B Nat Peptide     Status: None   Collection Time: 05/03/23  9:16 AM  Result Value Ref Range   Pro B Natriuretic peptide (BNP) 37.0 0.0 - 100.0 pg/mL  Hepatic function panel     Status: None   Collection Time: 05/03/23  9:16 AM  Result Value Ref Range   Total Bilirubin 0.8 0.2 - 1.2 mg/dL   Bilirubin, Direct 0.2 0.0 - 0.3 mg/dL   Alkaline Phosphatase 96 39 - 117 U/L   AST 25 0 - 37 U/L   ALT 21 0 - 53 U/L   Total Protein 7.7 6.0 - 8.3 g/dL   Albumin 4.3 3.5 - 5.2 g/dL  Basic Metabolic Panel (BMET)     Status: Abnormal   Collection Time: 05/03/23  9:16 AM  Result Value Ref Range   Sodium 131 (L) 135 - 145 mEq/L   Potassium 4.4 3.5 - 5.1 mEq/L   Chloride 98 96 - 112 mEq/L   CO2 27 19 - 32 mEq/L   Glucose, Bld 88 70 - 99 mg/dL   BUN 11 6 - 23 mg/dL   Creatinine, Ser 0.98 0.40 - 1.50 mg/dL   GFR 11.91 >47.82 mL/min    Comment: Calculated using the CKD-EPI Creatinine Equation (2021)   Calcium 9.2 8.4 - 10.5 mg/dL  Resp Allergy Profile [id number] DE MD [facility]     Status: Abnormal   Collection Time: 05/03/23  9:16 AM  Result Value Ref Range   Allergen, D pternoyssinus,d7 2.59 (H) kU/L   Class 2    D. farinae 8.24 (H) kU/L   Class 3    Allergen, P. notatum, m1 <0.10 kU/L   Class 0    CLADOSPORIUM HERBARUM (M2) IGE 0.20 (H) kU/L   Class 0/1    Aspergillus fumigatus, m3 0.13 (H) kU/L   Class 0/1    Allergen, A. alternata, m6 1.49 (H) kU/L   Class 2    Cat Dander 1.47 (H) kU/L   Class 2    Dog Dander 0.44 (H) kU/L   Class 1    Cockroach 0.17 (H) kU/L   Class 0/1     Box Elder IgE 0.28 (H) kU/L   Class 0/1    Allergen, Comm Silver Charletta Cousin, t9 0.17 (H) kU/L   Class 0/1    Allergen, Cedar tree, t12 0.15 (H) kU/L   Class 0/1    Allergen, Cottonwood, t14 0.16 (H) kU/L   Class 0/1    Allergen, Oak,t7 0.13 (H) kU/L   Class 0/1    Elm IgE 0.63 (H) kU/L   Class 1    Pecan/Hickory Tree IgE 5.61 (H) kU/L   Class 3    Allergen, Mulberry, t76 <0.10 kU/L   Class 0    [place] Grass 0.16 (H) kU/L   Class 0/1    Timothy Grass 17.60 (H) kU/L   Class 4    Johnson Grass 0.13 (H) kU/L   Class 0/1    COMMON RAGWEED (SHORT) (W1) IGE 1.93 (H) kU/L   Class 2    Rough Pigweed  IgE 0.15 (H) kU/L   Class 0/1    Sheep Sorrel IgE <0.10 kU/L   Class 0    Allergen, Mouse Urine Protein, e78 <0.10 kU/L   Class 0    IgE (Immunoglobulin E), Serum 1,140 (H) <OR=114 kU/L  CBC w/Diff     Status: None   Collection Time: 05/03/23  9:16 AM  Result Value Ref Range   WBC 6.0 4.0 - 10.5 K/uL   RBC 4.70 4.22 - 5.81 Mil/uL   Hemoglobin 15.2 13.0 - 17.0 g/dL   HCT 86.5 78.4 - 69.6 %   MCV 98.8 78.0 - 100.0 fl  MCHC 32.7 30.0 - 36.0 g/dL   RDW 98.1 19.1 - 47.8 %   Platelets 239.0 150.0 - 400.0 K/uL   Neutrophils Relative % 60.5 43.0 - 77.0 %   Lymphocytes Relative 23.6 12.0 - 46.0 %   Monocytes Relative 10.8 3.0 - 12.0 %   Eosinophils Relative 4.2 0.0 - 5.0 %   Basophils Relative 0.9 0.0 - 3.0 %   Neutro Abs 3.7 1.4 - 7.7 K/uL   Lymphs Abs 1.4 0.7 - 4.0 K/uL   Monocytes Absolute 0.7 0.1 - 1.0 K/uL   Eosinophils Absolute 0.3 0.0 - 0.7 K/uL   Basophils Absolute 0.1 0.0 - 0.1 K/uL  Reflex CAT DANDER COMPONENT     Status: Abnormal   Collection Time: 05/03/23  9:16 AM  Result Value Ref Range   Fel d 1 (e94) IgE 2.02 (H) <0.10 kU/L   E220-IgE Fel d 2 <0.10 <0.10 kU/L   E228-IgE Fel d 4 <0.10 <0.10 kU/L   Fel d 7 (e231) IgE <0.10 <0.10 kU/L    Comment: . Component testing for samples with positive extract results may help to rule out cross-reactivity and confirm that  allergy is present. The more components a patient is sensitized to, the higher the likelihood of a reaction when exposed to cats. .   Reflex DOG DANDER COMPONENT     Status: None   Collection Time: 05/03/23  9:16 AM  Result Value Ref Range   E101-IgE Can f 1 <0.10 <0.10 kU/L   E102-IgE Can f 2 <0.10 <0.10 kU/L   E221-IgE Can f 3 <0.10 <0.10 kU/L   Can f 4(e229) IgE <0.10 <0.10 kU/L   E226-IgE Can f 5 <0.10 <0.10 kU/L   Can f 6(e230) IgE <0.10 <0.10 kU/L    Comment: . Component testing for samples with positive extract results may help to rule out cross-reactivity and confirm that allergy is present. The more components a patient is sensitized to, the higher the likelihood of a reaction when exposed to dogs. Sensitization to  Can f 5 only may indicate that the patient can tolerate male dogs. .          has a past medical history of Cataracts, bilateral, GERD (gastroesophageal reflux disease), and Hyperlipemia.   reports that he has never smoked. He has never used smokeless tobacco.  Past Surgical History:  Procedure Laterality Date   APPENDECTOMY      Allergies  Allergen Reactions   Tetracycline     Other reaction(s): Other (See Comments), Unknown Lips swelled    Codeine     Other reaction(s): Unknown    Immunization History  Administered Date(s) Administered   Tdap 02/18/2017, 10/02/2020    No family history on file.   Current Outpatient Medications:    albuterol (VENTOLIN HFA) 108 (90 Base) MCG/ACT inhaler, Inhale 2 puffs into the lungs every 6 (six) hours as needed for wheezing or shortness of breath., Disp: 8 g, Rfl: 2   fluticasone furoate-vilanterol (BREO ELLIPTA) 100-25 MCG/ACT AEPB, Inhale 1 puff into the lungs daily., Disp: 30 each, Rfl: 11   acetaminophen (TYLENOL) 500 MG tablet, Take 1,000 mg by mouth every 8 (eight) hours., Disp: , Rfl:    ammonium lactate (LAC-HYDRIN FIVE) 5 % LOTN lotion, 1 application Externally Twice a day (Patient not  taking: Reported on 05/03/2023), Disp: , Rfl:    amoxicillin (AMOXIL) 500 MG tablet, Take by mouth. (Patient not taking: Reported on 05/03/2023), Disp: , Rfl:    aspirin EC 81 MG  tablet, 1 tablet Orally Once a day (Patient not taking: Reported on 05/03/2023), Disp: , Rfl:    cephALEXin (KEFLEX) 500 MG capsule, Take 500 mg by mouth 3 (three) times daily. (Patient not taking: Reported on 05/03/2023), Disp: , Rfl:    chlorhexidine (PERIDEX) 0.12 % solution, SMARTSIG:By Mouth (Patient not taking: Reported on 05/03/2023), Disp: , Rfl:    ciclopirox (PENLAC) 8 % solution, Apply topically at bedtime. Apply over nail and surrounding skin. Apply daily over previous coat. After seven (7) days, may remove with alcohol and continue cycle. (Patient not taking: Reported on 05/03/2023), Disp: 6.6 mL, Rfl: 0   Cyanocobalamin 1000 MCG TBCR, 0.5 tablet Orally Once a day (Patient not taking: Reported on 05/03/2023), Disp: , Rfl:    diclofenac Sodium (VOLTAREN) 1 % GEL, 1 application Externally Four times a day for 10 days (Patient not taking: Reported on 05/03/2023), Disp: , Rfl:    finasteride (PROSCAR) 5 MG tablet, Take 5 mg by mouth daily., Disp: , Rfl:    fluticasone (FLONASE) 50 MCG/ACT nasal spray, Place 1 spray into both nostrils daily as needed for allergies. (Patient not taking: Reported on 05/03/2023), Disp: , Rfl:    mupirocin ointment (BACTROBAN) 2 %, SMARTSIG:1 Application Topical 2-3 Times Daily (Patient not taking: Reported on 05/03/2023), Disp: , Rfl:    rosuvastatin (CRESTOR) 5 MG tablet, Take 1 tablet by mouth 3 (three) times a week. Monday Wednesday Friday, Disp: , Rfl:    tamsulosin (FLOMAX) 0.4 MG CAPS capsule, Take by mouth. (Patient not taking: Reported on 05/03/2023), Disp: , Rfl:    traMADol (ULTRAM) 50 MG tablet, Take by mouth. (Patient not taking: Reported on 05/03/2023), Disp: , Rfl:       Objective:   Vitals:   05/31/23 1428  BP: 112/60  Pulse: 61  SpO2: 97%  Weight: 188 lb 6.4 oz (85.5 kg)   Height: 6\' 3"  (1.905 m)    Estimated body mass index is 23.55 kg/m as calculated from the following:   Height as of this encounter: 6\' 3"  (1.905 m).   Weight as of this encounter: 188 lb 6.4 oz (85.5 kg).  @WEIGHTCHANGE @  [employer]   05/31/23 1428  Weight: 188 lb 6.4 oz (85.5 kg)     Physical Exam   General: No distress. Looks wel., FRAIl HARD OF Ray O2 at rest: no Cane present: no Sitting in wheel chair: no Frail: no Obese: no Neuro: Alert and Oriented x 3. GCS 15. Speech normal Psych: Pleasant Resp:  Barrel Chest - no.  Wheeze - no, Crackles - YES R  > L BASE, No overt respiratory distress CVS: Normal heart sounds. Murmurs - no Ext: Stigmata of Connective Tissue Disease - no HEENT: Normal upper airway. PEERL +. No post nasal drip        Assessment:       ICD-10-CM   1. Bronchiectasis without complication (HCC)  J47.9     2. Chronic cough  R05.3 Ambulatory referral to Allergy    3. Abnormal CT of the chest  R93.89     4. Bibasilar crackles  R09.89     5. Elevated IgE level  R76.8 Ambulatory referral to Allergy    6. Animal dander allergy  J30.81 Ambulatory referral to Allergy    7. House dust mite allergy  Z91.09 Ambulatory referral to Allergy    8. 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 Eosinophilia, unspecified type  D72.10 Ambulatory referral to Allergy  10. 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 Bronchiectasis without complication (HCC)  J47.9     2. Chronic cough  R05.3     3. Abnormal CT of the chest  R93.89     4. Bibasilar crackles  R09.89     5. Elevated IgE level  R76.8     6. Animal dander allergy  J30.81     7. House dust mite allergy  Z91.09     8. Allergy to mold spores  Z91.09     9. Eosinophilia, unspecified type  D72.10     10. Itchy skin  L29.9       YOU have  severe allergies for variety of environmental things like dust mite, dander, ragweed, roaches and mold Your itchy skin might be related to the allergies In the lungs you have bronchiectasis that likely is related to the allergies - might be a condition called ABPA  PLAN    - start breo 100 strength 1 puff dialy  - start albuterol as needed  - refer Dr Irena Cords allergist r [facility] allergy - talk to your dermatologist Dr Karlyn Agee   Followup  - APP visit in 8 -12 weeks or sooner if needed   FOLLOWUP Return in about 9 weeks (around 08/02/2023) for 15 min visit, Face to Face Visit, with any of the APPS.  ( Level 05 visit E&M 2024: Estb >= 40 min  in  visit type: on-site physical face to visit  in total care time and counseling or/and coordination of care by this undersigned MD - Dr Kalman Shan. This includes one or more of the following on this same day 05/31/2023: pre-charting, chart review, note writing, documentation discussion of test results, diagnostic or treatment recommendations, prognosis, risks and benefits of management options, instructions, education, compliance or risk-factor reduction. It excludes time spent by the CMA or office staff in the care of the patient. Actual time 40 min)  Had to talk loud and slow to help him understand   SIGNATURE    Dr. Kalman Shan, M.D., F.C.C.P,  Pulmonary and Critical Care Medicine Staff Physician, [facility] Director - Interstitial Lung Disease  Program  Pulmonary Fibrosis [facility] at [facility] [place], [place], [address]  Pager: [phone], If no answer or between  15:00h - 7:00h: call [phone] Telephone: [phone]  3:40 PM 05/31/2023

## 2023-05-31 NOTE — Patient Instructions (Addendum)
ICD-10-CM   1. Bronchiectasis without complication (HCC)  J47.9     2. Chronic cough  R05.3     3. Abnormal CT of the chest  R93.89     4. Bibasilar crackles  R09.89     5. Elevated IgE level  R76.8     6. Animal dander allergy  J30.81     7. House dust mite allergy  Z91.09     8. Allergy to mold spores  Z91.09     9. Eosinophilia, unspecified type  D72.10     10. Itchy skin  L29.9       YOU have severe allergies for variety of environmental things like dust mite, dander, ragweed, roaches and mold Your itchy skin might be related to the allergies In the lungs you have bronchiectasis that likely is related to the allergies - might be a condition called ABPA  PLAN    - start breo 100 strength 1 puff dialy  - start albuterol as needed  - refer Dr Irena Cords allergist r [facility] allergy - talk to your dermatologist Dr Karlyn Agee   Followup  - APP visit in 8 -12 weeks or sooner if needed

## 2023-06-02 DIAGNOSIS — Z85820 Personal history of malignant melanoma of skin: Secondary | ICD-10-CM | POA: Diagnosis not present

## 2023-06-02 DIAGNOSIS — L308 Other specified dermatitis: Secondary | ICD-10-CM | POA: Diagnosis not present

## 2023-06-02 DIAGNOSIS — Z85828 Personal history of other malignant neoplasm of skin: Secondary | ICD-10-CM | POA: Diagnosis not present

## 2023-06-18 ENCOUNTER — Ambulatory Visit: Payer: Medicare Other | Admitting: Adult Health

## 2023-07-07 DIAGNOSIS — D0461 Carcinoma in situ of skin of right upper limb, including shoulder: Secondary | ICD-10-CM | POA: Diagnosis not present

## 2023-07-07 DIAGNOSIS — D485 Neoplasm of uncertain behavior of skin: Secondary | ICD-10-CM | POA: Diagnosis not present

## 2023-07-07 DIAGNOSIS — L905 Scar conditions and fibrosis of skin: Secondary | ICD-10-CM | POA: Diagnosis not present

## 2023-07-07 DIAGNOSIS — D225 Melanocytic nevi of trunk: Secondary | ICD-10-CM | POA: Diagnosis not present

## 2023-07-07 DIAGNOSIS — Z85828 Personal history of other malignant neoplasm of skin: Secondary | ICD-10-CM | POA: Diagnosis not present

## 2023-07-07 DIAGNOSIS — L821 Other seborrheic keratosis: Secondary | ICD-10-CM | POA: Diagnosis not present

## 2023-07-07 DIAGNOSIS — Z85820 Personal history of malignant melanoma of skin: Secondary | ICD-10-CM | POA: Diagnosis not present

## 2023-07-07 DIAGNOSIS — L820 Inflamed seborrheic keratosis: Secondary | ICD-10-CM | POA: Diagnosis not present

## 2023-07-07 DIAGNOSIS — L570 Actinic keratosis: Secondary | ICD-10-CM | POA: Diagnosis not present

## 2023-07-08 DIAGNOSIS — R052 Subacute cough: Secondary | ICD-10-CM | POA: Diagnosis not present

## 2023-07-08 DIAGNOSIS — J3089 Other allergic rhinitis: Secondary | ICD-10-CM | POA: Diagnosis not present

## 2023-07-08 DIAGNOSIS — J301 Allergic rhinitis due to pollen: Secondary | ICD-10-CM | POA: Diagnosis not present

## 2023-07-08 DIAGNOSIS — J3081 Allergic rhinitis due to animal (cat) (dog) hair and dander: Secondary | ICD-10-CM | POA: Diagnosis not present

## 2023-08-09 ENCOUNTER — Ambulatory Visit: Payer: Medicare Other | Admitting: Adult Health

## 2023-08-09 ENCOUNTER — Encounter: Payer: Self-pay | Admitting: Adult Health

## 2023-08-09 VITALS — BP 108/50 | HR 67 | Temp 97.5°F | Ht 75.0 in | Wt 188.0 lb

## 2023-08-09 DIAGNOSIS — J4530 Mild persistent asthma, uncomplicated: Secondary | ICD-10-CM | POA: Diagnosis not present

## 2023-08-09 DIAGNOSIS — J309 Allergic rhinitis, unspecified: Secondary | ICD-10-CM | POA: Diagnosis not present

## 2023-08-09 DIAGNOSIS — J479 Bronchiectasis, uncomplicated: Secondary | ICD-10-CM | POA: Diagnosis not present

## 2023-08-09 DIAGNOSIS — J45909 Unspecified asthma, uncomplicated: Secondary | ICD-10-CM | POA: Insufficient documentation

## 2023-08-09 NOTE — Assessment & Plan Note (Signed)
 Bronchiectasis -bronchiectatic changes and pulmonary nodularity noted on CT scan.  Elevated IgE level and allergen panel.  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  Patient had clinical benefit with Breo.  Will continue on current maintenance regimen.  On return visit can decide on timing of follow-up CT chest.  Plan  Patient Instructions  Continue on Breo 1 puff daily, rinse after use  Albuterol  inhaler As needed  wheezing/shortness of breath  Take Claritin 10mg  daily (Over the counter) Begin Flonase  Nasal 2 puffs daily. (Over the counter ) Follow up with Dr. Geronimo in 4 months and As needed

## 2023-08-09 NOTE — Assessment & Plan Note (Signed)
 Begin Claritin and Flonase as directed.

## 2023-08-09 NOTE — Progress Notes (Signed)
 @Patient  ID: Adrian Ray, male    DOB: May 29, 1934, 89 y.o.   MRN: [id number]  Chief Complaint  Patient presents with   Follow-up    Referring provider: [name], MD  HPI: 89 year old male with minimum smoking history seen for pulmonary consult May 03, 2023 for abnormal chest x-ray and chronic cough for 2 years  History of hearing of loss-very hard of hearing.    TEST/EVENTS :  Allergy  panel-IgE 1,140 positive for cat dander, dog dander, dust mites, grass, aspergillus fumigatus  Eosinophils 300  Autoimmune serology for ANA, rheumatoid factor, ESR , CCP negative. High-resolution CT chest May 12, 2023 showed bilateral bronchiectasis and pulmonary nodules, focal area of bronchiectasis and nodularity in the right apex, scattered pulmonary nodules bilaterally.  08/09/2023 Follow up : Cough, Bronchiectasis , allergies Patient returns for a 48-month follow-up.  Patient was seen initially in October 2024 for abnormal chest x-ray and chronic cough x 2 years.  Patient had interstitial lung changes on chest x-ray.  He was set up for a high-resolution CT chest was done May 12, 2023 that was negative for ILD.  There was bilateral bronchiectasis and pulmonary nodularity.  Allergy  panel was very positive for cat dander, dog dander, dust mites, grass and Aspergillus fumigatus.  IgE was very high at 1140.  Eosinophils were elevated at 300.  Patient was started on Breo 100 daily.  Patient was referred to allergist for possible concern of ABPA. PFTs were not completed due to hearing issues. Since last visit he feels some better with decreased cough.  Allergy  notes in care Everywhere from Dr. Altamease recommended Claritin and Flonase . Patient is taking Claritin daily . Has not started Flonase  to date.  Overall feels that he is doing okay.  Denies any fever or discolored mucus.      Allergies  Allergen Reactions   Tetracycline     Other reaction(s): Other (See Comments), Unknown Lips  swelled    Codeine     Other reaction(s): Unknown    Immunization History  Administered Date(s) Administered   Fluad Quad(high Dose 65+) 03/08/2023   Tdap 02/18/2017, 10/02/2020    Past Medical History:  Diagnosis Date   Cataracts, bilateral    GERD (gastroesophageal reflux disease)    Hyperlipemia     Tobacco History: Social History   Tobacco Use  Smoking Status Never  Smokeless Tobacco Never   Counseling given: Not Answered   Outpatient Medications Prior to Visit  Medication Sig Dispense Refill   albuterol  (VENTOLIN  HFA) 108 (90 Base) MCG/ACT inhaler Inhale 2 puffs into the lungs every 6 (six) hours as needed for wheezing or shortness of breath. 8 g 2   finasteride  (PROSCAR ) 5 MG tablet Take 5 mg by mouth daily.     fluticasone  furoate-vilanterol (BREO ELLIPTA ) 100-25 MCG/ACT AEPB Inhale 1 puff into the lungs daily. 30 each 11   rosuvastatin (CRESTOR) 5 MG tablet Take 1 tablet by mouth 3 (three) times a week. Monday Wednesday Friday     tamsulosin (FLOMAX) 0.4 MG CAPS capsule Take by mouth.     acetaminophen  (TYLENOL ) 500 MG tablet Take 1,000 mg by mouth every 8 (eight) hours. (Patient not taking: Reported on 08/09/2023)     amoxicillin (AMOXIL) 500 MG tablet Take by mouth. (Patient not taking: Reported on 08/09/2023)     fluticasone  (FLONASE ) 50 MCG/ACT nasal spray Place 1 spray into both nostrils daily as needed for allergies. (Patient not taking: Reported on 05/03/2023)     ammonium lactate (LAC-HYDRIN  FIVE) 5 % LOTN lotion 1 application Externally Twice a day (Patient not taking: Reported on 08/09/2023)     aspirin  EC 81 MG tablet 1 tablet Orally Once a day (Patient not taking: Reported on 08/09/2023)     cephALEXin  (KEFLEX ) 500 MG capsule Take 500 mg by mouth 3 (three) times daily. (Patient not taking: Reported on 08/09/2023)     chlorhexidine  (PERIDEX ) 0.12 % solution SMARTSIG:By Mouth (Patient not taking: Reported on 08/09/2023)     ciclopirox  (PENLAC ) 8 % solution Apply  topically at bedtime. Apply over nail and surrounding skin. Apply daily over previous coat. After seven (7) days, may remove with alcohol and continue cycle. (Patient not taking: Reported on 08/09/2023) 6.6 mL 0   Cyanocobalamin  1000 MCG TBCR 0.5 tablet Orally Once a day (Patient not taking: Reported on 08/09/2023)     diclofenac Sodium (VOLTAREN) 1 % GEL 1 application Externally Four times a day for 10 days (Patient not taking: Reported on 08/09/2023)     mupirocin  ointment (BACTROBAN ) 2 % SMARTSIG:1 Application Topical 2-3 Times Daily (Patient not taking: Reported on 08/09/2023)     traMADol (ULTRAM) 50 MG tablet Take by mouth. (Patient not taking: Reported on 08/09/2023)     triamcinolone (KENALOG) 0.025 % cream Apply 1 Application topically as directed. (Patient not taking: Reported on 08/09/2023)     No facility-administered medications prior to visit.     Review of Systems:   Constitutional:   No  weight loss, night sweats,  Fevers, chills, fatigue, or  lassitude.  HEENT:   No headaches,  Difficulty swallowing,  Tooth/dental problems, or  Sore throat,                No sneezing, itching, ear ache, +nasal congestion, post nasal drip,   CV:  No chest pain,  Orthopnea, PND, swelling in lower extremities, anasarca, dizziness, palpitations, syncope.   GI  No heartburn, indigestion, abdominal pain, nausea, vomiting, diarrhea, change in bowel habits, loss of appetite, bloody stools.   Resp:  No chest wall deformity  Skin: no rash or lesions.  GU: no dysuria, change in color of urine, no urgency or frequency.  No flank pain, no hematuria   MS:  No joint pain or swelling.  No decreased range of motion.  No back pain.    Physical Exam  BP (!) 108/50 (BP Location: Left Arm, Patient Position: Sitting, Cuff Size: Normal)   Pulse 67   Temp (!) 97.5 F (36.4 C) (Oral)   Ht 6' 3 (1.905 m)   Wt 188 lb (85.3 kg)   SpO2 97%   BMI 23.50 kg/m   GEN: A/Ox3; pleasant , NAD, well nourished     HEENT:  [place]/AT,  NOSE-clear, THROAT-clear, no lesions, no postnasal drip or exudate noted.   NECK:  Supple w/ fair ROM; no JVD; normal carotid impulses w/o bruits; no thyromegaly or nodules palpated; no lymphadenopathy.    RESP  Clear  P & A; w/o, wheezes/ rales/ or rhonchi. no accessory muscle use, no dullness to percussion  CARD:  RRR, no m/r/g, no peripheral edema, pulses intact, no cyanosis or clubbing.  GI:   Soft & nt; nml bowel sounds; no organomegaly or masses detected.   Musco: Warm bil, no deformities or joint swelling noted.   Neuro: alert, no focal deficits noted.    Skin: Warm, no lesions or rashes    Lab Results:  CBC    Component Value Date/Time   WBC 6.0 05/03/2023 0916  RBC 4.70 05/03/2023 0916   HGB 15.2 05/03/2023 0916   HCT 46.5 05/03/2023 0916   PLT 239.0 05/03/2023 0916   MCV 98.8 05/03/2023 0916   MCH 32.7 10/24/2020 0509   MCHC 32.7 05/03/2023 0916   RDW 14.3 05/03/2023 0916   LYMPHSABS 1.4 05/03/2023 0916   MONOABS 0.7 05/03/2023 0916   EOSABS 0.3 05/03/2023 0916   BASOSABS 0.1 05/03/2023 0916    BMET  BNP No results found for: BNP  ProBNP  No results found.  Administration History     None           No data to display          No results found for: NITRICOXIDE      Assessment & Plan:   Asthma Suspected asthma with an allergic component.-Patient is unable to complete PFTs due to severe hearing impairment. Allergy  panel with multiple positive triggers including dust, cat and dog dander, grass, Aspergillus fumigatus.  IgE elevated at 1140. Elevated eosinophils 300.  Patient has had clinical benefit with Breo.  Allergist consult with recommendations to begin Claritin and Flonase . Could consider adding in Singulair for now we will continue on current regimen.  Plan  Patient Instructions  Continue on Breo 1 puff daily, rinse after use  Albuterol  inhaler As needed  wheezing/shortness of breath  Take Claritin 10mg   daily (Over the counter) Begin Flonase  Nasal 2 puffs daily. (Over the counter ) Follow up with Dr. Geronimo in 4 months and As needed      Allergic rhinitis Begin Claritin and Flonase  as directed.  Bronchiectasis without complication (HCC) Bronchiectasis -bronchiectatic changes and pulmonary nodularity noted on CT scan.  Elevated IgE level and allergen panel.  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  Patient had clinical benefit with Breo.  Will continue on current maintenance regimen.  On return visit can decide on timing of follow-up CT chest.  Plan  Patient Instructions  Continue on Breo 1 puff daily, rinse after use  Albuterol  inhaler As needed  wheezing/shortness of breath  Take Claritin 10mg  daily (Over the counter) Begin Flonase  Nasal 2 puffs daily. (Over the counter ) Follow up with Dr. Geronimo in 4 months and As needed        Madelin Stank, NP 08/09/2023

## 2023-08-09 NOTE — Assessment & Plan Note (Signed)
 Suspected asthma with an allergic component.-Patient is unable to complete PFTs due to severe hearing impairment. Allergy  panel with multiple positive triggers including dust, cat and dog dander, grass, Aspergillus fumigatus.  IgE elevated at 1140. Elevated eosinophils 300.  Patient has had clinical benefit with Breo.  Allergist consult with recommendations to begin Claritin and Flonase . Could consider adding in Singulair for now we will continue on current regimen.  Plan  Patient Instructions  Continue on Breo 1 puff daily, rinse after use  Albuterol  inhaler As needed  wheezing/shortness of breath  Take Claritin 10mg  daily (Over the counter) Begin Flonase  Nasal 2 puffs daily. (Over the counter ) Follow up with Dr. Geronimo in 4 months and As needed

## 2023-08-09 NOTE — Patient Instructions (Addendum)
 Continue on Breo 1 puff daily, rinse after use  Albuterol  inhaler As needed  wheezing/shortness of breath  Take Claritin 10mg  daily (Over the counter) Begin Flonase  Nasal 2 puffs daily. (Over the counter ) Follow up with Dr. Geronimo in 4 months and As needed

## 2023-08-18 DIAGNOSIS — J309 Allergic rhinitis, unspecified: Secondary | ICD-10-CM | POA: Diagnosis not present

## 2023-08-18 DIAGNOSIS — E538 Deficiency of other specified B group vitamins: Secondary | ICD-10-CM | POA: Diagnosis not present

## 2023-08-18 DIAGNOSIS — H903 Sensorineural hearing loss, bilateral: Secondary | ICD-10-CM | POA: Diagnosis not present

## 2023-08-18 DIAGNOSIS — Z23 Encounter for immunization: Secondary | ICD-10-CM | POA: Diagnosis not present

## 2023-09-22 ENCOUNTER — Encounter: Payer: Self-pay | Admitting: Podiatry

## 2023-09-22 ENCOUNTER — Ambulatory Visit (INDEPENDENT_AMBULATORY_CARE_PROVIDER_SITE_OTHER): Payer: Medicare Other | Admitting: Podiatry

## 2023-09-22 DIAGNOSIS — Q828 Other specified congenital malformations of skin: Secondary | ICD-10-CM | POA: Diagnosis not present

## 2023-09-22 NOTE — Progress Notes (Signed)
  Subjective:  Patient ID: Adrian Ray, male    DOB: 1934/01/18,  MRN: [id number]  Chief Complaint  Patient presents with   Callouses     Left foot under toes. C/o pain    89 y.o. male presents with the above complaint.  Patient presents with left submetatarsal 2 porokeratotic lesion painful to touch is progressive and worsens with ambulation worse with pressure pain scale is 5 out of 10 sharp shooting in nature.  He would like to have it debrided and it does give him relief.     Review of Systems: Negative except as noted in the HPI. Denies N/V/F/Ch.  Past Medical History:  Diagnosis Date   Cataracts, bilateral    GERD (gastroesophageal reflux disease)    Hyperlipemia     Current Outpatient Medications:    acetaminophen (TYLENOL) 500 MG tablet, Take 1,000 mg by mouth every 8 (eight) hours. (Patient not taking: Reported on 08/09/2023), Disp: , Rfl:    albuterol (VENTOLIN HFA) 108 (90 Base) MCG/ACT inhaler, Inhale 2 puffs into the lungs every 6 (six) hours as needed for wheezing or shortness of breath., Disp: 8 g, Rfl: 2   amoxicillin (AMOXIL) 500 MG tablet, Take by mouth. (Patient not taking: Reported on 08/09/2023), Disp: , Rfl:    finasteride (PROSCAR) 5 MG tablet, Take 5 mg by mouth daily., Disp: , Rfl:    fluticasone (FLONASE) 50 MCG/ACT nasal spray, Place 1 spray into both nostrils daily as needed for allergies. (Patient not taking: Reported on 05/03/2023), Disp: , Rfl:    fluticasone furoate-vilanterol (BREO ELLIPTA) 100-25 MCG/ACT AEPB, Inhale 1 puff into the lungs daily., Disp: 30 each, Rfl: 11   rosuvastatin (CRESTOR) 5 MG tablet, Take 1 tablet by mouth 3 (three) times a week. Monday Wednesday Friday, Disp: , Rfl:    tamsulosin (FLOMAX) 0.4 MG CAPS capsule, Take by mouth., Disp: , Rfl:   Social History   Tobacco Use  Smoking Status Never  Smokeless Tobacco Never    Allergies  Allergen Reactions   Tetracycline     Other reaction(s): Other (See Comments), Unknown Lips  swelled    Codeine     Other reaction(s): Unknown   Objective:  There were no vitals filed for this visit. There is no height or weight on file to calculate BMI. Constitutional Well developed. Well nourished.  Vascular Dorsalis pedis pulses palpable bilaterally. Posterior tibial pulses palpable bilaterally. Capillary refill normal to all digits.  No cyanosis or clubbing noted. Pedal hair growth normal.  Neurologic Normal speech. Oriented to person, place, and time. Epicritic sensation to light touch grossly present bilaterally.  Dermatologic -Check lesion with central nucleated core noted left submetatarsal 2 pain on palpation.  No pinpoint bleeding noted upon debridement  Orthopedic: Normal joint ROM without pain or crepitus bilaterally. No visible deformities. No bony tenderness.   Radiographs: None Assessment:   No diagnosis found.   Plan:  Patient was evaluated and treated and all questions answered.  Left submetatarsal 2 porokeratosis -All questions and concerns were discussed with the patient in extensive detail -Given the amount of pain that he is having he will benefit from debridement of the lesion, as a courtesy using chisel blade to handle lesion with healthy dry tissue no complication noted no pinpoint bleeding noted. -I discussed shoe gear modification as well.  And offloading pads  No follow-ups on file.

## 2023-10-04 DIAGNOSIS — E7800 Pure hypercholesterolemia, unspecified: Secondary | ICD-10-CM | POA: Diagnosis not present

## 2023-10-04 DIAGNOSIS — H903 Sensorineural hearing loss, bilateral: Secondary | ICD-10-CM | POA: Diagnosis not present

## 2023-10-04 DIAGNOSIS — N401 Enlarged prostate with lower urinary tract symptoms: Secondary | ICD-10-CM | POA: Diagnosis not present

## 2023-10-04 DIAGNOSIS — E871 Hypo-osmolality and hyponatremia: Secondary | ICD-10-CM | POA: Diagnosis not present

## 2023-10-04 DIAGNOSIS — R413 Other amnesia: Secondary | ICD-10-CM | POA: Diagnosis not present

## 2023-10-04 DIAGNOSIS — J309 Allergic rhinitis, unspecified: Secondary | ICD-10-CM | POA: Diagnosis not present

## 2023-10-04 DIAGNOSIS — E538 Deficiency of other specified B group vitamins: Secondary | ICD-10-CM | POA: Diagnosis not present

## 2023-10-05 DIAGNOSIS — C44622 Squamous cell carcinoma of skin of right upper limb, including shoulder: Secondary | ICD-10-CM | POA: Diagnosis not present

## 2023-10-05 DIAGNOSIS — L570 Actinic keratosis: Secondary | ICD-10-CM | POA: Diagnosis not present

## 2023-10-05 DIAGNOSIS — L905 Scar conditions and fibrosis of skin: Secondary | ICD-10-CM | POA: Diagnosis not present

## 2023-10-05 DIAGNOSIS — Z85820 Personal history of malignant melanoma of skin: Secondary | ICD-10-CM | POA: Diagnosis not present

## 2023-10-05 DIAGNOSIS — Z85828 Personal history of other malignant neoplasm of skin: Secondary | ICD-10-CM | POA: Diagnosis not present

## 2023-10-05 DIAGNOSIS — L821 Other seborrheic keratosis: Secondary | ICD-10-CM | POA: Diagnosis not present

## 2023-10-05 DIAGNOSIS — D485 Neoplasm of uncertain behavior of skin: Secondary | ICD-10-CM | POA: Diagnosis not present

## 2023-10-25 DIAGNOSIS — H35362 Drusen (degenerative) of macula, left eye: Secondary | ICD-10-CM | POA: Diagnosis not present

## 2023-10-25 DIAGNOSIS — H40013 Open angle with borderline findings, low risk, bilateral: Secondary | ICD-10-CM | POA: Diagnosis not present

## 2023-10-25 DIAGNOSIS — H43393 Other vitreous opacities, bilateral: Secondary | ICD-10-CM | POA: Diagnosis not present

## 2023-10-25 DIAGNOSIS — H2513 Age-related nuclear cataract, bilateral: Secondary | ICD-10-CM | POA: Diagnosis not present

## 2023-10-27 ENCOUNTER — Ambulatory Visit: Admitting: Podiatry

## 2023-10-27 ENCOUNTER — Ambulatory Visit (INDEPENDENT_AMBULATORY_CARE_PROVIDER_SITE_OTHER): Admitting: Podiatry

## 2023-10-27 DIAGNOSIS — B351 Tinea unguium: Secondary | ICD-10-CM | POA: Diagnosis not present

## 2023-10-27 DIAGNOSIS — M79675 Pain in left toe(s): Secondary | ICD-10-CM | POA: Diagnosis not present

## 2023-10-27 DIAGNOSIS — M79674 Pain in right toe(s): Secondary | ICD-10-CM | POA: Diagnosis not present

## 2023-10-27 NOTE — Progress Notes (Signed)
  Subjective:  Patient ID: Adrian Ray, male    DOB: [birth year],  MRN: [id number]  Chief Complaint  Patient presents with   Nail Problem    Nail trim   89 y.o. male returns for the above complaint.  Patient presents with thickened elongated dystrophic mycotic toenails x 10 mild pain on palpation hurts with ambulation as pressure he would like to have it debrided on his diabetic diet himself denies any other acute complaints  Objective:  There were no vitals filed for this visit. Podiatric Exam: Vascular: dorsalis pedis and posterior tibial pulses are palpable bilateral. Capillary return is immediate. Temperature gradient is WNL. Skin turgor WNL  Sensorium: Normal Semmes Weinstein monofilament test. Normal tactile sensation bilaterally. Nail Exam: Pt has thick disfigured discolored nails with subungual debris noted bilateral entire nail hallux through fifth toenails.  Pain on palpation to the nails. Ulcer Exam: There is no evidence of ulcer or pre-ulcerative changes or infection. Orthopedic Exam: Muscle tone and strength are WNL. No limitations in general ROM. No crepitus or effusions noted.  Skin: No Porokeratosis. No infection or ulcers    Assessment & Plan:  No diagnosis found.  Patient was evaluated and treated and all questions answered.  Onychomycosis with pain  -Nails palliatively debrided as below. -Educated on self-care  Procedure: Nail Debridement Rationale: pain  Type of Debridement: manual, sharp debridement. Instrumentation: Nail nipper, rotary burr. Number of Nails: 10  Procedures and Treatment: Consent by patient was obtained for treatment procedures. The patient understood the discussion of treatment and procedures well. All questions were answered thoroughly reviewed. Debridement of mycotic and hypertrophic toenails, 1 through 5 bilateral and clearing of subungual debris. No ulceration, no infection noted.  Return Visit-Office Procedure: Patient instructed to  return to the office for a follow up visit 3 months for continued evaluation and treatment.  Nicholes Rough, DPM    No follow-ups on file.

## 2023-12-03 ENCOUNTER — Ambulatory Visit (INDEPENDENT_AMBULATORY_CARE_PROVIDER_SITE_OTHER): Admitting: Podiatry

## 2023-12-03 DIAGNOSIS — Q828 Other specified congenital malformations of skin: Secondary | ICD-10-CM | POA: Diagnosis not present

## 2023-12-03 NOTE — Progress Notes (Signed)
  Subjective:  Patient ID: Adrian Ray, male    DOB: [birth year],  MRN: [id number]  Chief Complaint  Patient presents with   Callouses    89 y.o. male presents with the above complaint.  Patient presents with left submetatarsal 2 porokeratotic lesion painful to touch is progressive and worsens with ambulation worse with pressure pain scale is 5 out of 10 sharp shooting in nature.  He would like to have it debrided and it does give him relief.     Review of Systems: Negative except as noted in the HPI. Denies N/V/F/Ch.  Past Medical History:  Diagnosis Date   Cataracts, bilateral    GERD (gastroesophageal reflux disease)    Hyperlipemia     Current Outpatient Medications:    acetaminophen  (TYLENOL ) 500 MG tablet, Take 1,000 mg by mouth every 8 (eight) hours. (Patient not taking: Reported on 08/09/2023), Disp: , Rfl:    albuterol  (VENTOLIN  HFA) 108 (90 Base) MCG/ACT inhaler, Inhale 2 puffs into the lungs every 6 (six) hours as needed for wheezing or shortness of breath., Disp: 8 g, Rfl: 2   amoxicillin (AMOXIL) 500 MG tablet, Take by mouth. (Patient not taking: Reported on 08/09/2023), Disp: , Rfl:    finasteride (PROSCAR) 5 MG tablet, Take 5 mg by mouth daily., Disp: , Rfl:    fluticasone  (FLONASE) 50 MCG/ACT nasal spray, Place 1 spray into both nostrils daily as needed for allergies. (Patient not taking: Reported on 05/03/2023), Disp: , Rfl:    fluticasone  furoate-vilanterol (BREO ELLIPTA ) 100-25 MCG/ACT AEPB, Inhale 1 puff into the lungs daily., Disp: 30 each, Rfl: 11   rosuvastatin (CRESTOR) 5 MG tablet, Take 1 tablet by mouth 3 (three) times a week. Monday Wednesday Friday, Disp: , Rfl:    tamsulosin (FLOMAX) 0.4 MG CAPS capsule, Take by mouth., Disp: , Rfl:   Social History   Tobacco Use  Smoking Status Never  Smokeless Tobacco Never    Allergies  Allergen Reactions   Tetracycline     Other reaction(s): Other (See Comments), Unknown Lips swelled    Codeine     Other  reaction(s): Unknown   Objective:  There were no vitals filed for this visit. There is no height or weight on file to calculate BMI. Constitutional Well developed. Well nourished.  Vascular Dorsalis pedis pulses palpable bilaterally. Posterior tibial pulses palpable bilaterally. Capillary refill normal to all digits.  No cyanosis or clubbing noted. Pedal hair growth normal.  Neurologic Normal speech. Oriented to person, place, and time. Epicritic sensation to light touch grossly present bilaterally.  Dermatologic -Check lesion with central nucleated core noted left submetatarsal 2 pain on palpation.  No pinpoint bleeding noted upon debridement  Orthopedic: Normal joint ROM without pain or crepitus bilaterally. No visible deformities. No bony tenderness.   Radiographs: None Assessment:   No diagnosis found.   Plan:  Patient was evaluated and treated and all questions answered.  Left submetatarsal 2 porokeratosis -All questions and concerns were discussed with the patient in extensive detail -Given the amount of pain that he is having he will benefit from debridement of the lesion, as a courtesy using chisel blade to handle lesion with healthy dry tissue no complication noted no pinpoint bleeding noted. -I discussed shoe gear modification as well.  And offloading pads  No follow-ups on file.

## 2023-12-06 DIAGNOSIS — R051 Acute cough: Secondary | ICD-10-CM | POA: Diagnosis not present

## 2023-12-06 DIAGNOSIS — M545 Low back pain, unspecified: Secondary | ICD-10-CM | POA: Diagnosis not present

## 2023-12-06 DIAGNOSIS — J309 Allergic rhinitis, unspecified: Secondary | ICD-10-CM | POA: Diagnosis not present

## 2023-12-23 DIAGNOSIS — N401 Enlarged prostate with lower urinary tract symptoms: Secondary | ICD-10-CM | POA: Diagnosis not present

## 2023-12-23 DIAGNOSIS — J309 Allergic rhinitis, unspecified: Secondary | ICD-10-CM | POA: Diagnosis not present

## 2024-01-18 DIAGNOSIS — L570 Actinic keratosis: Secondary | ICD-10-CM | POA: Diagnosis not present

## 2024-01-18 DIAGNOSIS — Z85820 Personal history of malignant melanoma of skin: Secondary | ICD-10-CM | POA: Diagnosis not present

## 2024-01-18 DIAGNOSIS — L814 Other melanin hyperpigmentation: Secondary | ICD-10-CM | POA: Diagnosis not present

## 2024-01-18 DIAGNOSIS — Z85828 Personal history of other malignant neoplasm of skin: Secondary | ICD-10-CM | POA: Diagnosis not present

## 2024-01-18 DIAGNOSIS — L821 Other seborrheic keratosis: Secondary | ICD-10-CM | POA: Diagnosis not present

## 2024-01-18 DIAGNOSIS — L905 Scar conditions and fibrosis of skin: Secondary | ICD-10-CM | POA: Diagnosis not present

## 2024-01-19 ENCOUNTER — Ambulatory Visit (INDEPENDENT_AMBULATORY_CARE_PROVIDER_SITE_OTHER): Admitting: Podiatry

## 2024-01-19 DIAGNOSIS — Q828 Other specified congenital malformations of skin: Secondary | ICD-10-CM

## 2024-01-19 NOTE — Progress Notes (Signed)
  Subjective:  Patient ID: Adrian Ray, male    DOB: Nov 13, 1933,  MRN: [id number]  Chief Complaint  Patient presents with   Porokeratosis    Porokeratosis    89 y.o. male presents with the above complaint.  Patient presents with left submetatarsal 2 porokeratotic lesion painful to touch is progressive and worsens with ambulation worse with pressure pain scale is 5 out of 10 sharp shooting in nature.  He would like to have it debrided and it does give him relief.     Review of Systems: Negative except as noted in the HPI. Denies N/V/F/Ch.  Past Medical History:  Diagnosis Date   Cataracts, bilateral    GERD (gastroesophageal reflux disease)    Hyperlipemia     Current Outpatient Medications:    acetaminophen  (TYLENOL ) 500 MG tablet, Take 1,000 mg by mouth every 8 (eight) hours. (Patient not taking: Reported on 08/09/2023), Disp: , Rfl:    albuterol  (VENTOLIN  HFA) 108 (90 Base) MCG/ACT inhaler, Inhale 2 puffs into the lungs every 6 (six) hours as needed for wheezing or shortness of breath., Disp: 8 g, Rfl: 2   amoxicillin (AMOXIL) 500 MG tablet, Take by mouth. (Patient not taking: Reported on 08/09/2023), Disp: , Rfl:    finasteride (PROSCAR) 5 MG tablet, Take 5 mg by mouth daily., Disp: , Rfl:    fluticasone  (FLONASE) 50 MCG/ACT nasal spray, Place 1 spray into both nostrils daily as needed for allergies. (Patient not taking: Reported on 05/03/2023), Disp: , Rfl:    fluticasone  furoate-vilanterol (BREO ELLIPTA ) 100-25 MCG/ACT AEPB, Inhale 1 puff into the lungs daily., Disp: 30 each, Rfl: 11   rosuvastatin (CRESTOR) 5 MG tablet, Take 1 tablet by mouth 3 (three) times a week. Monday Wednesday Friday, Disp: , Rfl:    tamsulosin (FLOMAX) 0.4 MG CAPS capsule, Take by mouth., Disp: , Rfl:   Social History   Tobacco Use  Smoking Status Never  Smokeless Tobacco Never    Allergies  Allergen Reactions   Tetracycline     Other reaction(s): Other (See Comments), Unknown Lips swelled     Codeine     Other reaction(s): Unknown   Objective:  There were no vitals filed for this visit. There is no height or weight on file to calculate BMI. Constitutional Well developed. Well nourished.  Vascular Dorsalis pedis pulses palpable bilaterally. Posterior tibial pulses palpable bilaterally. Capillary refill normal to all digits.  No cyanosis or clubbing noted. Pedal hair growth normal.  Neurologic Normal speech. Oriented to person, place, and time. Epicritic sensation to light touch grossly present bilaterally.  Dermatologic -Check lesion with central nucleated core noted left submetatarsal 2 pain on palpation.  No pinpoint bleeding noted upon debridement  Orthopedic: Normal joint ROM without pain or crepitus bilaterally. No visible deformities. No bony tenderness.   Radiographs: None Assessment:   No diagnosis found.   Plan:  Patient was evaluated and treated and all questions answered.  Left submetatarsal 2 porokeratosis -All questions and concerns were discussed with the patient in extensive detail -Given the amount of pain that he is having he will benefit from debridement of the lesion, as a courtesy using chisel blade to handle lesion with healthy dry tissue no complication noted no pinpoint bleeding noted. -I discussed shoe gear modification as well.  And offloading pads  No follow-ups on file.

## 2024-02-24 DIAGNOSIS — G301 Alzheimer's disease with late onset: Secondary | ICD-10-CM | POA: Diagnosis not present

## 2024-03-07 ENCOUNTER — Ambulatory Visit (INDEPENDENT_AMBULATORY_CARE_PROVIDER_SITE_OTHER): Admitting: Otolaryngology

## 2024-03-07 ENCOUNTER — Encounter (INDEPENDENT_AMBULATORY_CARE_PROVIDER_SITE_OTHER): Payer: Self-pay | Admitting: Otolaryngology

## 2024-03-07 VITALS — BP 125/67 | HR 74

## 2024-03-07 DIAGNOSIS — H9193 Unspecified hearing loss, bilateral: Secondary | ICD-10-CM

## 2024-03-07 DIAGNOSIS — J342 Deviated nasal septum: Secondary | ICD-10-CM | POA: Diagnosis not present

## 2024-03-07 DIAGNOSIS — R0981 Nasal congestion: Secondary | ICD-10-CM

## 2024-03-07 DIAGNOSIS — J343 Hypertrophy of nasal turbinates: Secondary | ICD-10-CM | POA: Insufficient documentation

## 2024-03-07 DIAGNOSIS — H903 Sensorineural hearing loss, bilateral: Secondary | ICD-10-CM

## 2024-03-07 DIAGNOSIS — J310 Chronic rhinitis: Secondary | ICD-10-CM | POA: Insufficient documentation

## 2024-03-07 MED ORDER — FLUTICASONE PROPIONATE 50 MCG/ACT NA SUSP: 2.0000 | Freq: Every day | NASAL | 10 refills | Status: AC

## 2024-03-07 NOTE — Progress Notes (Signed)
 Patient ID: Adrian Ray, male   DOB: 04/10/1934, 89 y.o.   MRN: [id number]  Follow-up: Hearing loss, chronic nasal congestion  HPI: The patient is an 89 year old male who returns today for his follow-up evaluation.  The patient was last seen 1 year ago.  At that time, he was complaining of progressive hearing loss and chronic nasal congestion.  He was noted to have bilateral severe to profound high-frequency hearing loss.  He was advised that he could benefit from cochlear implantation.  A referral to [facility] otology department was made.  The patient was also noted to have nasal mucosal congestion, nasal septal deviation, and bilateral inferior turbinate hypertrophy.  He was treated with Flonase  nasal spray.  The patient returns today complaining of persistent hearing difficulty and chronic nasal congestion.  He is using bilateral hearing aids.  He uses Flonase  intermittently. He is requesting a refill.  Exam: General: Communicates without difficulty, well nourished, no acute distress. Head: Normocephalic, no evidence injury, no tenderness, facial buttresses intact without stepoff. Eyes: PERRL, EOMI. No scleral icterus, conjunctivae clear. Neuro: CN II exam reveals vision grossly intact.  No nystagmus at any point of gaze. Ears: Auricles well formed without lesions.  EAC: The ear canals and TMs are noted to be normal.  No mass, erythema, or lesions.  Nose: External evaluation reveals normal support and skin without lesions.  Dorsum is intact.  Anterior rhinoscopy reveals congested and edematous mucosa over anterior aspect of the inferior turbinates and deviated nasal septum.  No purulence is noted. Middle meatus is not well visualized. Oral:  Oral cavity and oropharynx are intact, symmetric, without erythema or edema.  Mucosa is moist without lesions. Neck: Full range of motion without pain.  There is no significant lymphadenopathy.  No masses palpable.  Thyroid  bed within normal limits to palpation.   Parotid glands and submandibular glands equal bilaterally without mass.  Trachea is midline. Neuro:  CN 2-12 grossly intact. Gait normal. Vestibular: No nystagmus at any point of gaze.   Procedure:  Flexible Nasal Endoscopy: Description: Risks, benefits, and alternatives of flexible endoscopy were explained to the patient.  Specific mention was made of the risk of throat numbness with difficulty swallowing, possible bleeding from the nose and mouth, and pain from the procedure.  The patient gave oral consent to proceed.  The flexible scope was inserted into the right nasal cavity.  Endoscopy of the interior nasal cavity, superior, inferior, and middle meatus was performed. The sphenoid-ethmoid recess was examined. Edematous mucosa was noted.  No polyp, mass, or lesion was appreciated. Nasal septal deviation noted. Olfactory cleft was clear.  Nasopharynx was clear.  Turbinates were hypertrophied but without mass.  The procedure was repeated on the contralateral side with similar findings.  The patient tolerated the procedure well.   Assessment  1.  The patient has a history of bilateral severe to profound hearing loss.  He is a candidate for cochlear implant surgery.  2.  The patient's ear canals, tympanic membranes, and middle ear spaces are all normal. No middle ear effusion is noted.  3.  Chronic rhinitis, with nasal mucosal congestion, nasal septal deviation, and bilateral inferior turbinate hypertrophy.  4.  No acute infection, purulent drainage, polyps, or lesion is noted on today's nasal endoscopy examination.   Plan 1.  The physical exam and nasal endoscopy findings are reviewed with the patient.  2.  The patient is encouraged to use his Flonase  nasal spray on a consistent basis. Flonase  refills. 3.  The patient is encouraged to follow up with Dr. Lynnann regarding his cochlear implant surgery.  4.  The patient is encouraged to call with any questions or concerns.

## 2024-03-10 ENCOUNTER — Ambulatory Visit (INDEPENDENT_AMBULATORY_CARE_PROVIDER_SITE_OTHER): Admitting: Podiatry

## 2024-03-10 DIAGNOSIS — Q828 Other specified congenital malformations of skin: Secondary | ICD-10-CM | POA: Diagnosis not present

## 2024-03-10 NOTE — Progress Notes (Signed)
  Subjective:  Patient ID: Adrian Ray, male    DOB: 08/26/1933,  MRN: [id number]  Chief Complaint  Patient presents with   Nail Problem    89 y.o. male presents with the above complaint.  Patient presents with left submetatarsal 2 porokeratotic lesion painful to touch is progressive and worsens with ambulation worse with pressure pain scale is 5 out of 10 sharp shooting in nature.  He would like to have it debrided and it does give him relief.     Review of Systems: Negative except as noted in the HPI. Denies N/V/F/Ch.  Past Medical History:  Diagnosis Date   Cataracts, bilateral    GERD (gastroesophageal reflux disease)    Hyperlipemia     Current Outpatient Medications:    acetaminophen  (TYLENOL ) 500 MG tablet, Take 1,000 mg by mouth every 8 (eight) hours. (Patient not taking: Reported on 03/07/2024), Disp: , Rfl:    albuterol  (VENTOLIN  HFA) 108 (90 Base) MCG/ACT inhaler, Inhale 2 puffs into the lungs every 6 (six) hours as needed for wheezing or shortness of breath., Disp: 8 g, Rfl: 2   amoxicillin (AMOXIL) 500 MG tablet, Take by mouth. (Patient not taking: Reported on 03/07/2024), Disp: , Rfl:    finasteride (PROSCAR) 5 MG tablet, Take 5 mg by mouth daily., Disp: , Rfl:    fluticasone  (FLONASE ) 50 MCG/ACT nasal spray, Place 1 spray into both nostrils daily as needed for allergies., Disp: , Rfl:    fluticasone  (FLONASE ) 50 MCG/ACT nasal spray, Place 2 sprays into both nostrils daily., Disp: 16 g, Rfl: 10   fluticasone  furoate-vilanterol (BREO ELLIPTA ) 100-25 MCG/ACT AEPB, Inhale 1 puff into the lungs daily., Disp: 30 each, Rfl: 11   rosuvastatin (CRESTOR) 5 MG tablet, Take 1 tablet by mouth 3 (three) times a week. Monday Wednesday Friday, Disp: , Rfl:    tamsulosin (FLOMAX) 0.4 MG CAPS capsule, Take by mouth., Disp: , Rfl:   Social History   Tobacco Use  Smoking Status Never  Smokeless Tobacco Never    Allergies  Allergen Reactions   Tetracycline     Other reaction(s): Other  (See Comments), Unknown Lips swelled    Codeine     Other reaction(s): Unknown   Objective:  There were no vitals filed for this visit. There is no height or weight on file to calculate BMI. Constitutional Well developed. Well nourished.  Vascular Dorsalis pedis pulses palpable bilaterally. Posterior tibial pulses palpable bilaterally. Capillary refill normal to all digits.  No cyanosis or clubbing noted. Pedal hair growth normal.  Neurologic Normal speech. Oriented to person, place, and time. Epicritic sensation to light touch grossly present bilaterally.  Dermatologic -Check lesion with central nucleated core noted left submetatarsal 2 pain on palpation.  No pinpoint bleeding noted upon debridement  Orthopedic: Normal joint ROM without pain or crepitus bilaterally. No visible deformities. No bony tenderness.   Radiographs: None Assessment:   No diagnosis found.   Plan:  Patient was evaluated and treated and all questions answered.  Left submetatarsal 2 porokeratosis -All questions and concerns were discussed with the patient in extensive detail -Given the amount of pain that he is having he will benefit from debridement of the lesion, as a courtesy using chisel blade to handle lesion with healthy dry tissue no complication noted no pinpoint bleeding noted. -I discussed shoe gear modification as well.  And offloading pads  No follow-ups on file.

## 2024-03-20 DIAGNOSIS — H9190 Unspecified hearing loss, unspecified ear: Secondary | ICD-10-CM | POA: Diagnosis not present

## 2024-03-20 DIAGNOSIS — L299 Pruritus, unspecified: Secondary | ICD-10-CM | POA: Diagnosis not present

## 2024-03-20 DIAGNOSIS — R052 Subacute cough: Secondary | ICD-10-CM | POA: Diagnosis not present

## 2024-03-20 DIAGNOSIS — J301 Allergic rhinitis due to pollen: Secondary | ICD-10-CM | POA: Diagnosis not present

## 2024-04-20 DIAGNOSIS — C44729 Squamous cell carcinoma of skin of left lower limb, including hip: Secondary | ICD-10-CM | POA: Diagnosis not present

## 2024-04-20 DIAGNOSIS — D485 Neoplasm of uncertain behavior of skin: Secondary | ICD-10-CM | POA: Diagnosis not present

## 2024-04-20 DIAGNOSIS — C4442 Squamous cell carcinoma of skin of scalp and neck: Secondary | ICD-10-CM | POA: Diagnosis not present

## 2024-04-20 DIAGNOSIS — Z85820 Personal history of malignant melanoma of skin: Secondary | ICD-10-CM | POA: Diagnosis not present

## 2024-04-20 DIAGNOSIS — L570 Actinic keratosis: Secondary | ICD-10-CM | POA: Diagnosis not present

## 2024-04-20 DIAGNOSIS — L821 Other seborrheic keratosis: Secondary | ICD-10-CM | POA: Diagnosis not present

## 2024-04-20 DIAGNOSIS — C44722 Squamous cell carcinoma of skin of right lower limb, including hip: Secondary | ICD-10-CM | POA: Diagnosis not present

## 2024-04-20 DIAGNOSIS — Z85828 Personal history of other malignant neoplasm of skin: Secondary | ICD-10-CM | POA: Diagnosis not present

## 2024-05-03 DIAGNOSIS — Z85828 Personal history of other malignant neoplasm of skin: Secondary | ICD-10-CM | POA: Diagnosis not present

## 2024-05-03 DIAGNOSIS — L0889 Other specified local infections of the skin and subcutaneous tissue: Secondary | ICD-10-CM | POA: Diagnosis not present

## 2024-05-03 DIAGNOSIS — Z85820 Personal history of malignant melanoma of skin: Secondary | ICD-10-CM | POA: Diagnosis not present

## 2024-05-03 DIAGNOSIS — S80211A Abrasion, right knee, initial encounter: Secondary | ICD-10-CM | POA: Diagnosis not present

## 2024-05-03 DIAGNOSIS — L0100 Impetigo, unspecified: Secondary | ICD-10-CM | POA: Diagnosis not present

## 2024-05-09 DIAGNOSIS — S80811S Abrasion, right lower leg, sequela: Secondary | ICD-10-CM | POA: Diagnosis not present

## 2024-05-09 DIAGNOSIS — Z85820 Personal history of malignant melanoma of skin: Secondary | ICD-10-CM | POA: Diagnosis not present

## 2024-05-09 DIAGNOSIS — Z85828 Personal history of other malignant neoplasm of skin: Secondary | ICD-10-CM | POA: Diagnosis not present

## 2024-05-15 DIAGNOSIS — C44722 Squamous cell carcinoma of skin of right lower limb, including hip: Secondary | ICD-10-CM | POA: Diagnosis not present

## 2024-05-15 DIAGNOSIS — Z85820 Personal history of malignant melanoma of skin: Secondary | ICD-10-CM | POA: Diagnosis not present

## 2024-05-15 DIAGNOSIS — D485 Neoplasm of uncertain behavior of skin: Secondary | ICD-10-CM | POA: Diagnosis not present

## 2024-05-15 DIAGNOSIS — Z85828 Personal history of other malignant neoplasm of skin: Secondary | ICD-10-CM | POA: Diagnosis not present

## 2024-05-31 ENCOUNTER — Encounter: Payer: Self-pay | Admitting: Podiatry

## 2024-05-31 ENCOUNTER — Telehealth: Payer: Self-pay | Admitting: *Deleted

## 2024-05-31 ENCOUNTER — Encounter (INDEPENDENT_AMBULATORY_CARE_PROVIDER_SITE_OTHER): Payer: Self-pay

## 2024-05-31 DIAGNOSIS — M25512 Pain in left shoulder: Secondary | ICD-10-CM | POA: Diagnosis not present

## 2024-05-31 DIAGNOSIS — S42032A Displaced fracture of lateral end of left clavicle, initial encounter for closed fracture: Secondary | ICD-10-CM | POA: Diagnosis not present

## 2024-05-31 NOTE — Telephone Encounter (Signed)
 Medical POA printed out and placed in scan folder to be scanned into patient chart.

## 2024-06-02 DIAGNOSIS — E538 Deficiency of other specified B group vitamins: Secondary | ICD-10-CM | POA: Diagnosis not present

## 2024-06-02 DIAGNOSIS — H903 Sensorineural hearing loss, bilateral: Secondary | ICD-10-CM | POA: Diagnosis not present

## 2024-06-02 DIAGNOSIS — E871 Hypo-osmolality and hyponatremia: Secondary | ICD-10-CM | POA: Diagnosis not present

## 2024-06-02 DIAGNOSIS — S42009A Fracture of unspecified part of unspecified clavicle, initial encounter for closed fracture: Secondary | ICD-10-CM | POA: Diagnosis not present

## 2024-06-02 DIAGNOSIS — G301 Alzheimer's disease with late onset: Secondary | ICD-10-CM | POA: Diagnosis not present

## 2024-06-02 DIAGNOSIS — E7800 Pure hypercholesterolemia, unspecified: Secondary | ICD-10-CM | POA: Diagnosis not present

## 2024-06-02 DIAGNOSIS — Z0000 Encounter for general adult medical examination without abnormal findings: Secondary | ICD-10-CM | POA: Diagnosis not present

## 2024-06-02 DIAGNOSIS — Z23 Encounter for immunization: Secondary | ICD-10-CM | POA: Diagnosis not present

## 2024-06-02 DIAGNOSIS — Z1389 Encounter for screening for other disorder: Secondary | ICD-10-CM | POA: Diagnosis not present

## 2024-06-02 DIAGNOSIS — J479 Bronchiectasis, uncomplicated: Secondary | ICD-10-CM | POA: Diagnosis not present

## 2024-06-02 DIAGNOSIS — I700 Atherosclerosis of aorta: Secondary | ICD-10-CM | POA: Diagnosis not present

## 2024-06-02 DIAGNOSIS — N401 Enlarged prostate with lower urinary tract symptoms: Secondary | ICD-10-CM | POA: Diagnosis not present

## 2024-06-08 ENCOUNTER — Ambulatory Visit (INDEPENDENT_AMBULATORY_CARE_PROVIDER_SITE_OTHER): Admitting: Otolaryngology

## 2024-06-08 ENCOUNTER — Encounter (INDEPENDENT_AMBULATORY_CARE_PROVIDER_SITE_OTHER): Payer: Self-pay | Admitting: Otolaryngology

## 2024-06-08 VITALS — BP 110/68 | HR 72 | Ht 74.5 in | Wt 180.0 lb

## 2024-06-08 DIAGNOSIS — J342 Deviated nasal septum: Secondary | ICD-10-CM

## 2024-06-08 DIAGNOSIS — H6993 Unspecified Eustachian tube disorder, bilateral: Secondary | ICD-10-CM | POA: Diagnosis not present

## 2024-06-08 DIAGNOSIS — J343 Hypertrophy of nasal turbinates: Secondary | ICD-10-CM

## 2024-06-08 DIAGNOSIS — H903 Sensorineural hearing loss, bilateral: Secondary | ICD-10-CM

## 2024-06-08 DIAGNOSIS — J310 Chronic rhinitis: Secondary | ICD-10-CM

## 2024-06-08 NOTE — Progress Notes (Signed)
 Patient ID: Adrian Ray, male   DOB: [birth year], 89 y.o.   MRN: [id number]  Follow up: Severe hearing loss, chronic nasal congestions  Discussed the use of AI scribe software for clinical note transcription with the patient, who gave verbal consent to proceed.  History of Present Illness Adrian Ray is an 89 year old male with severe to profound sensorineural hearing loss who presents with nasal congestion and hearing difficulties.  He experiences intermittent nasal congestion every couple of days. Despite using Flonase  nasal spray as prescribed, it remains ineffective.   He reports poor word understanding despite daily use of hearing aids and recalls being told he has severe hearing loss. Despite daily use of hearing aids, his word understanding remains poor. He recalls being informed that he may be a candidate for cochlear implants due to the severity of his hearing loss.  He experiences tinnitus, which he understands is associated with his hearing loss. He performs Valsalva maneuvers to manage Eustachian tube dysfunction, which he finds helpful in opening up his ears. He performs this exercise multiple times a day.  Exam: General: Communicates with difficulty, well nourished, no acute distress. Head: Normocephalic, no evidence injury, no tenderness, facial buttresses intact without stepoff. Face/sinus: No tenderness to palpation and percussion. Facial movement is normal and symmetric. Eyes: PERRL, EOMI. No scleral icterus, conjunctivae clear. Neuro: CN II exam reveals vision grossly intact.  No nystagmus at any point of gaze. Ears: Auricles well formed without lesions.  Ear canals are intact without mass or lesion.  No erythema or edema is appreciated.  The TMs are intact without fluid. Nose: External evaluation reveals normal support and skin without lesions.  Dorsum is intact.  Anterior rhinoscopy reveals congested mucosa over anterior aspect of inferior turbinates and deviated septum.  No  purulence noted. Oral:  Oral cavity and oropharynx are intact, symmetric, without erythema or edema.  Mucosa is moist without lesions. Neck: Full range of motion without pain.  There is no significant lymphadenopathy.  No masses palpable.  Thyroid  bed within normal limits to palpation.  Parotid glands and submandibular glands equal bilaterally without mass.  Trachea is midline. Neuro:  CN 2-12 grossly intact.    Assessment and Plan Assessment & Plan Chronic rhinitis with nasal mucosal congestion, deviated nasal septum, and bilateral inferior turbinate hypertrophy - Continue Flonase  nasal spray, two sprays in each nostril once daily. - Consider surgical intervention if nasal congestion remains bothersome despite consistent use of Flonase .  Severe to profound bilateral sensorineural hearing loss with associated tinnitus Current hearing aids are insufficient. Tinnitus is present as a common symptom of hearing loss. Cochlear implantation is a potential option for improved hearing and word understanding. Previous referral to otologist at [facility] for cochlear implant evaluation has been made. - Continue using hearing aids daily. - Consider cochlear implantation if word understanding remains poor despite hearing aid use. - Perform Valsalva maneuver several times a day to help with Eustachian tube function.

## 2024-06-13 ENCOUNTER — Other Ambulatory Visit: Payer: Self-pay

## 2024-06-13 ENCOUNTER — Inpatient Hospital Stay (HOSPITAL_COMMUNITY)
Admission: EM | Admit: 2024-06-13 | Discharge: 2024-06-17 | DRG: 472 | Disposition: A | Discharge status: Rehabilitation Facility | Source: Skilled Nursing Facility | Attending: Family Medicine | Admitting: Family Medicine

## 2024-06-13 ENCOUNTER — Encounter (HOSPITAL_COMMUNITY): Payer: Self-pay | Admitting: Internal Medicine

## 2024-06-13 ENCOUNTER — Emergency Department (HOSPITAL_COMMUNITY)

## 2024-06-13 ENCOUNTER — Inpatient Hospital Stay (HOSPITAL_COMMUNITY)

## 2024-06-13 DIAGNOSIS — Y9301 Activity, walking, marching and hiking: Secondary | ICD-10-CM | POA: Diagnosis present

## 2024-06-13 DIAGNOSIS — E871 Hypo-osmolality and hyponatremia: Secondary | ICD-10-CM | POA: Diagnosis present

## 2024-06-13 DIAGNOSIS — M542 Cervicalgia: Secondary | ICD-10-CM | POA: Diagnosis not present

## 2024-06-13 DIAGNOSIS — S12110D Anterior displaced Type II dens fracture, subsequent encounter for fracture with routine healing: Secondary | ICD-10-CM | POA: Diagnosis present

## 2024-06-13 DIAGNOSIS — N400 Enlarged prostate without lower urinary tract symptoms: Secondary | ICD-10-CM | POA: Diagnosis present

## 2024-06-13 DIAGNOSIS — J849 Interstitial pulmonary disease, unspecified: Secondary | ICD-10-CM | POA: Diagnosis present

## 2024-06-13 DIAGNOSIS — M47812 Spondylosis without myelopathy or radiculopathy, cervical region: Secondary | ICD-10-CM | POA: Diagnosis not present

## 2024-06-13 DIAGNOSIS — S12000A Unspecified displaced fracture of first cervical vertebra, initial encounter for closed fracture: Secondary | ICD-10-CM | POA: Diagnosis not present

## 2024-06-13 DIAGNOSIS — Z91013 Allergy to seafood: Secondary | ICD-10-CM | POA: Diagnosis not present

## 2024-06-13 DIAGNOSIS — S80212A Abrasion, left knee, initial encounter: Secondary | ICD-10-CM | POA: Diagnosis present

## 2024-06-13 DIAGNOSIS — F02A0 Dementia in other diseases classified elsewhere, mild, without behavioral disturbance, psychotic disturbance, mood disturbance, and anxiety: Secondary | ICD-10-CM | POA: Diagnosis not present

## 2024-06-13 DIAGNOSIS — S0990XA Unspecified injury of head, initial encounter: Secondary | ICD-10-CM

## 2024-06-13 DIAGNOSIS — S0181XA Laceration without foreign body of other part of head, initial encounter: Secondary | ICD-10-CM | POA: Diagnosis present

## 2024-06-13 DIAGNOSIS — M4802 Spinal stenosis, cervical region: Secondary | ICD-10-CM | POA: Diagnosis present

## 2024-06-13 DIAGNOSIS — Z602 Problems related to living alone: Secondary | ICD-10-CM | POA: Diagnosis present

## 2024-06-13 DIAGNOSIS — M1710 Unilateral primary osteoarthritis, unspecified knee: Secondary | ICD-10-CM | POA: Diagnosis present

## 2024-06-13 DIAGNOSIS — W19XXXA Unspecified fall, initial encounter: Secondary | ICD-10-CM | POA: Insufficient documentation

## 2024-06-13 DIAGNOSIS — Y92098 Other place in other non-institutional residence as the place of occurrence of the external cause: Secondary | ICD-10-CM | POA: Diagnosis not present

## 2024-06-13 DIAGNOSIS — D72829 Elevated white blood cell count, unspecified: Secondary | ICD-10-CM | POA: Diagnosis present

## 2024-06-13 DIAGNOSIS — J4489 Other specified chronic obstructive pulmonary disease: Secondary | ICD-10-CM | POA: Diagnosis present

## 2024-06-13 DIAGNOSIS — R5381 Other malaise: Secondary | ICD-10-CM | POA: Diagnosis present

## 2024-06-13 DIAGNOSIS — J310 Chronic rhinitis: Secondary | ICD-10-CM | POA: Diagnosis present

## 2024-06-13 DIAGNOSIS — R55 Syncope and collapse: Secondary | ICD-10-CM | POA: Diagnosis not present

## 2024-06-13 DIAGNOSIS — F0390 Unspecified dementia without behavioral disturbance: Secondary | ICD-10-CM | POA: Diagnosis present

## 2024-06-13 DIAGNOSIS — S12101A Unspecified nondisplaced fracture of second cervical vertebra, initial encounter for closed fracture: Secondary | ICD-10-CM | POA: Diagnosis not present

## 2024-06-13 DIAGNOSIS — W010XXD Fall on same level from slipping, tripping and stumbling without subsequent striking against object, subsequent encounter: Secondary | ICD-10-CM | POA: Diagnosis present

## 2024-06-13 DIAGNOSIS — H905 Unspecified sensorineural hearing loss: Secondary | ICD-10-CM | POA: Diagnosis present

## 2024-06-13 DIAGNOSIS — E782 Mixed hyperlipidemia: Secondary | ICD-10-CM

## 2024-06-13 DIAGNOSIS — H903 Sensorineural hearing loss, bilateral: Secondary | ICD-10-CM | POA: Diagnosis present

## 2024-06-13 DIAGNOSIS — I651 Occlusion and stenosis of basilar artery: Secondary | ICD-10-CM | POA: Diagnosis not present

## 2024-06-13 DIAGNOSIS — K5901 Slow transit constipation: Secondary | ICD-10-CM | POA: Diagnosis not present

## 2024-06-13 DIAGNOSIS — Z9889 Other specified postprocedural states: Secondary | ICD-10-CM | POA: Diagnosis not present

## 2024-06-13 DIAGNOSIS — Z981 Arthrodesis status: Secondary | ICD-10-CM | POA: Diagnosis not present

## 2024-06-13 DIAGNOSIS — S12100B Unspecified displaced fracture of second cervical vertebra, initial encounter for open fracture: Secondary | ICD-10-CM | POA: Diagnosis not present

## 2024-06-13 DIAGNOSIS — Z66 Do not resuscitate: Secondary | ICD-10-CM | POA: Diagnosis present

## 2024-06-13 DIAGNOSIS — Z8709 Personal history of other diseases of the respiratory system: Secondary | ICD-10-CM

## 2024-06-13 DIAGNOSIS — Z881 Allergy status to other antibiotic agents status: Secondary | ICD-10-CM

## 2024-06-13 DIAGNOSIS — J449 Chronic obstructive pulmonary disease, unspecified: Secondary | ICD-10-CM | POA: Diagnosis not present

## 2024-06-13 DIAGNOSIS — Z79899 Other long term (current) drug therapy: Secondary | ICD-10-CM | POA: Diagnosis not present

## 2024-06-13 DIAGNOSIS — Z7951 Long term (current) use of inhaled steroids: Secondary | ICD-10-CM | POA: Diagnosis not present

## 2024-06-13 DIAGNOSIS — Z9049 Acquired absence of other specified parts of digestive tract: Secondary | ICD-10-CM | POA: Diagnosis not present

## 2024-06-13 DIAGNOSIS — K219 Gastro-esophageal reflux disease without esophagitis: Secondary | ICD-10-CM | POA: Diagnosis present

## 2024-06-13 DIAGNOSIS — Z87891 Personal history of nicotine dependence: Secondary | ICD-10-CM | POA: Diagnosis not present

## 2024-06-13 DIAGNOSIS — Z885 Allergy status to narcotic agent status: Secondary | ICD-10-CM | POA: Diagnosis not present

## 2024-06-13 DIAGNOSIS — M532X1 Spinal instabilities, occipito-atlanto-axial region: Secondary | ICD-10-CM | POA: Diagnosis not present

## 2024-06-13 DIAGNOSIS — E785 Hyperlipidemia, unspecified: Secondary | ICD-10-CM | POA: Diagnosis present

## 2024-06-13 DIAGNOSIS — S022XXA Fracture of nasal bones, initial encounter for closed fracture: Secondary | ICD-10-CM | POA: Diagnosis present

## 2024-06-13 DIAGNOSIS — S80211A Abrasion, right knee, initial encounter: Secondary | ICD-10-CM | POA: Diagnosis present

## 2024-06-13 DIAGNOSIS — F0280 Dementia in other diseases classified elsewhere without behavioral disturbance: Secondary | ICD-10-CM | POA: Diagnosis present

## 2024-06-13 DIAGNOSIS — G301 Alzheimer's disease with late onset: Secondary | ICD-10-CM | POA: Diagnosis not present

## 2024-06-13 DIAGNOSIS — W010XXA Fall on same level from slipping, tripping and stumbling without subsequent striking against object, initial encounter: Secondary | ICD-10-CM | POA: Diagnosis present

## 2024-06-13 DIAGNOSIS — J45909 Unspecified asthma, uncomplicated: Secondary | ICD-10-CM | POA: Diagnosis not present

## 2024-06-13 DIAGNOSIS — M25519 Pain in unspecified shoulder: Secondary | ICD-10-CM | POA: Diagnosis present

## 2024-06-13 DIAGNOSIS — S12120A Other displaced dens fracture, initial encounter for closed fracture: Secondary | ICD-10-CM | POA: Diagnosis not present

## 2024-06-13 DIAGNOSIS — S12110A Anterior displaced Type II dens fracture, initial encounter for closed fracture: Principal | ICD-10-CM | POA: Diagnosis present

## 2024-06-13 DIAGNOSIS — S12100A Unspecified displaced fracture of second cervical vertebra, initial encounter for closed fracture: Secondary | ICD-10-CM | POA: Diagnosis not present

## 2024-06-13 DIAGNOSIS — K5900 Constipation, unspecified: Secondary | ICD-10-CM | POA: Diagnosis present

## 2024-06-13 DIAGNOSIS — R519 Headache, unspecified: Secondary | ICD-10-CM | POA: Diagnosis not present

## 2024-06-13 DIAGNOSIS — S12100D Unspecified displaced fracture of second cervical vertebra, subsequent encounter for fracture with routine healing: Secondary | ICD-10-CM | POA: Diagnosis not present

## 2024-06-13 DIAGNOSIS — I10 Essential (primary) hypertension: Secondary | ICD-10-CM | POA: Diagnosis not present

## 2024-06-13 DIAGNOSIS — E7800 Pure hypercholesterolemia, unspecified: Secondary | ICD-10-CM | POA: Diagnosis not present

## 2024-06-13 DIAGNOSIS — G309 Alzheimer's disease, unspecified: Secondary | ICD-10-CM | POA: Diagnosis present

## 2024-06-13 HISTORY — DX: Deviated nasal septum: J34.2

## 2024-06-13 HISTORY — DX: Benign prostatic hyperplasia without lower urinary tract symptoms: N40.0

## 2024-06-13 HISTORY — DX: Solitary pulmonary nodule: R91.1

## 2024-06-13 HISTORY — DX: Tinnitus, unspecified ear: H93.19

## 2024-06-13 HISTORY — DX: Fracture of unspecified part of unspecified clavicle, initial encounter for closed fracture: S42.009A

## 2024-06-13 HISTORY — DX: Unspecified hearing loss, unspecified ear: H91.90

## 2024-06-13 HISTORY — DX: COVID-19: U07.1

## 2024-06-13 HISTORY — DX: Chronic obstructive pulmonary disease, unspecified: J44.9

## 2024-06-13 HISTORY — DX: Unspecified dementia, unspecified severity, without behavioral disturbance, psychotic disturbance, mood disturbance, and anxiety: F03.90

## 2024-06-13 LAB — CBC WITH DIFFERENTIAL/PLATELET
Abs Immature Granulocytes: 0.03 K/uL (ref 0.00–0.07)
Basophils Absolute: 0.1 K/uL (ref 0.0–0.1)
Basophils Relative: 1 %
Eosinophils Absolute: 0.2 K/uL (ref 0.0–0.5)
Eosinophils Relative: 1 %
HCT: 39.7 % (ref 39.0–52.0)
Hemoglobin: 13.2 g/dL (ref 13.0–17.0)
Immature Granulocytes: 0 %
Lymphocytes Relative: 12 %
Lymphs Abs: 1.3 K/uL (ref 0.7–4.0)
MCH: 32.1 pg (ref 26.0–34.0)
MCHC: 33.2 g/dL (ref 30.0–36.0)
MCV: 96.6 fL (ref 80.0–100.0)
Monocytes Absolute: 1 K/uL (ref 0.1–1.0)
Monocytes Relative: 8 %
Neutro Abs: 8.9 K/uL — ABNORMAL HIGH (ref 1.7–7.7)
Neutrophils Relative %: 78 %
Platelets: 235 K/uL (ref 150–400)
RBC: 4.11 MIL/uL — ABNORMAL LOW (ref 4.22–5.81)
RDW: 13.2 % (ref 11.5–15.5)
WBC: 11.5 K/uL — ABNORMAL HIGH (ref 4.0–10.5)
nRBC: 0 % (ref 0.0–0.2)

## 2024-06-13 LAB — URINALYSIS, ROUTINE W REFLEX MICROSCOPIC
Bacteria, UA: NONE SEEN
Bilirubin Urine: NEGATIVE
Glucose, UA: NEGATIVE mg/dL
Hgb urine dipstick: NEGATIVE
Ketones, ur: NEGATIVE mg/dL
Nitrite: NEGATIVE
Protein, ur: NEGATIVE mg/dL
Specific Gravity, Urine: 1.019 (ref 1.005–1.030)
pH: 6 (ref 5.0–8.0)

## 2024-06-13 LAB — COMPREHENSIVE METABOLIC PANEL WITH GFR
ALT: 12 U/L (ref 0–44)
AST: 22 U/L (ref 15–41)
Albumin: 3.1 g/dL — ABNORMAL LOW (ref 3.5–5.0)
Alkaline Phosphatase: 94 U/L (ref 38–126)
Anion gap: 10 (ref 5–15)
BUN: 10 mg/dL (ref 8–23)
CO2: 23 mmol/L (ref 22–32)
Calcium: 7.9 mg/dL — ABNORMAL LOW (ref 8.9–10.3)
Chloride: 100 mmol/L (ref 98–111)
Creatinine, Ser: 0.79 mg/dL (ref 0.61–1.24)
GFR, Estimated: >60 mL/min (ref >60)
Glucose, Bld: 128 mg/dL — ABNORMAL HIGH (ref 70–99)
Potassium: 3.7 mmol/L (ref 3.5–5.1)
Sodium: 133 mmol/L — ABNORMAL LOW (ref 135–145)
Total Bilirubin: 0.6 mg/dL (ref 0.0–1.2)
Total Protein: 6.2 g/dL — ABNORMAL LOW (ref 6.5–8.1)

## 2024-06-13 LAB — PREPARE RBC (CROSSMATCH)

## 2024-06-13 LAB — PROTIME-INR
INR: 1.1 (ref 0.8–1.2)
Prothrombin Time: 15.1 s (ref 11.4–15.2)

## 2024-06-13 LAB — ABO/RH: ABO/RH(D): A POS

## 2024-06-13 LAB — APTT: aPTT: 29 s (ref 24–36)

## 2024-06-13 MED ORDER — FENTANYL CITRATE (PF) 50 MCG/ML IJ SOSY
25.0000 ug | PREFILLED_SYRINGE | INTRAMUSCULAR | Status: DC | PRN
Start: 2024-06-13 — End: 2024-06-17
  Administered 2024-06-14: 25 ug via INTRAVENOUS
  Filled 2024-06-13: qty 1

## 2024-06-13 MED ORDER — ACETAMINOPHEN 325 MG PO TABS
650.0000 mg | ORAL_TABLET | Freq: Four times a day (QID) | ORAL | Status: DC | PRN
  Administered 2024-06-16 – 2024-06-17 (×3): 650 mg via ORAL
  Filled 2024-06-13 (×3): qty 2

## 2024-06-13 MED ORDER — LIDOCAINE-EPINEPHRINE (PF) 2 %-1:200000 IJ SOLN
INTRAMUSCULAR | Status: AC
  Filled 2024-06-13: qty 20

## 2024-06-13 MED ORDER — MELATONIN 3 MG PO TABS: 3.0000 mg | ORAL_TABLET | Freq: Every evening | ORAL | Status: DC | PRN

## 2024-06-13 MED ORDER — LIDOCAINE-EPINEPHRINE (PF) 2 %-1:200000 IJ SOLN
10.0000 mL | Freq: Once | INTRAMUSCULAR | Status: AC
  Administered 2024-06-13: 10 mL

## 2024-06-13 MED ORDER — SODIUM CHLORIDE 0.9 % IV BOLUS
500.0000 mL | Freq: Once | INTRAVENOUS | Status: AC
  Administered 2024-06-13: 500 mL via INTRAVENOUS

## 2024-06-13 MED ORDER — DIAZEPAM 5 MG/ML IJ SOLN: 5.0000 mg | Freq: Once | INTRAMUSCULAR | Status: DC | PRN

## 2024-06-13 MED ORDER — ONDANSETRON HCL 4 MG/2ML IJ SOLN: 4.0000 mg | Freq: Four times a day (QID) | INTRAMUSCULAR | Status: DC | PRN

## 2024-06-13 MED ORDER — NALOXONE HCL 0.4 MG/ML IJ SOLN: 0.4000 mg | INTRAMUSCULAR | Status: DC | PRN

## 2024-06-13 MED ORDER — FENTANYL CITRATE (PF) 50 MCG/ML IJ SOSY
25.0000 ug | PREFILLED_SYRINGE | Freq: Once | INTRAMUSCULAR | Status: AC
  Administered 2024-06-13: 25 ug via INTRAVENOUS
  Filled 2024-06-13: qty 1

## 2024-06-13 MED ORDER — ACETAMINOPHEN 650 MG RE SUPP: 650.0000 mg | Freq: Four times a day (QID) | RECTAL | Status: DC | PRN

## 2024-06-13 NOTE — ED Notes (Addendum)
 Neuro surgeon evaluated patient / spoke with family , he explained plan of care to family  .

## 2024-06-13 NOTE — ED Notes (Signed)
 Patient transported to CT scan .

## 2024-06-13 NOTE — ED Triage Notes (Signed)
 Patient bib EMS from [facility], patient had a unwitnessed fall and fell face first on concrete. He has abrasions on knees, hands and his upper lip. Laceration on forehead and bridge of his nose. EMS states he does not take blood thinners, he is having neck pain, he has GCS of 15, A&Ox4 and is HOH.

## 2024-06-13 NOTE — ED Provider Notes (Addendum)
 [place] EMERGENCY DEPARTMENT AT [facility] Provider Note   CSN: [id number] Arrival date & time: 06/13/24  8167     Patient presents with: Fall (/) and Head Injury   Adrian Ray is a 89 y.o. male with a history of dementia who lives at [facility] who presents to the emergency department via EMS due to unwitnessed fall.  Per EMS report patient fell face forward onto concrete.  Patient history extremely limited due to history of dementia as well as history of sensorineural hearing loss, unable to fully attain if fall was mechanical or due to weakness. Having to communicate with the patient at this time using pen and paper.  Patient appreciates severe neck pain.  Denies blood thinning medication.  Per EMS patient has abrasions to bilateral knees, hands, as well as a busted lip.  Laceration on forehead as well as bridge of nose.  Past medical history significant for dementia, hearing loss, diverticulosis, asthma, cataracts, GERD, hyperlipidemia, etc.  Spoke with patient's daughters who are listed in chart, patient lives at assisted living facility but is ambulatory without assistance and was only just recently diagnosed with dementia. Per daughters patient is a very active 38 year old.     Fall  Head Injury      Prior to Admission medications   Medication Sig Start Date End Date Taking? Authorizing Provider  acetaminophen  (TYLENOL ) 500 MG tablet Take 1,000 mg by mouth every 8 (eight) hours. Patient not taking: Reported on 06/08/2024    [provider]  albuterol  (VENTOLIN  HFA) 108 (90 Base) MCG/ACT inhaler Inhale 2 puffs into the lungs every 6 (six) hours as needed for wheezing or shortness of breath. 05/31/23   Geronimo Amel, MD  amoxicillin (AMOXIL) 500 MG tablet Take by mouth. Patient not taking: Reported on 06/08/2024 10/19/22   [provider]  finasteride  (PROSCAR ) 5 MG tablet Take 5 mg by mouth daily. 04/20/22   [provider]  fluticasone  (FLONASE ) 50 MCG/ACT nasal spray Place 1 spray into both nostrils daily as needed for allergies. 01/16/16   [provider]  fluticasone  (FLONASE ) 50 MCG/ACT nasal spray Place 2 sprays into both nostrils daily. 03/07/24 06/08/24  [name], MD  fluticasone  furoate-vilanterol (BREO ELLIPTA ) 100-25 MCG/ACT AEPB Inhale 1 puff into the lungs daily. 05/31/23   [name], [name], MD  rosuvastatin (CRESTOR) 5 MG tablet Take 1 tablet by mouth 3 (three) times a week. Monday Wednesday Friday 10/03/20   [provider]  tamsulosin (FLOMAX) 0.4 MG CAPS capsule Take by mouth. 05/15/22   [provider]    Allergies: Tetracycline and Codeine    Review of Systems  HENT:         Neck pain    Updated Vital Signs BP (!) 150/88   Pulse 81   Temp (!) 97.4 F (36.3 C) (Temporal)   Resp 15   SpO2 95%   Physical Exam Vitals and nursing note reviewed.  Constitutional:      General: He is awake. He is not in acute distress.    Appearance: Normal appearance. He is not ill-appearing, toxic-appearing or diaphoretic.  HENT:     Head:     Comments: Laceration present to forehead as well as bridge of nose, ecchymosis surrounding R eye    Ears:     Comments: Chronic sensoneural hearing loss    Mouth/Throat:     Comments: Busted lip Neck:     Comments: Patient in C-collar at time of  exam Pulmonary:     Effort: Pulmonary effort is normal. No respiratory distress.  Chest:     Chest wall: No tenderness.  Abdominal:     General: Abdomen is flat. There is no distension.     Palpations: Abdomen is soft.     Tenderness: There is no abdominal tenderness. There is no right CVA tenderness, left CVA tenderness or guarding.  Musculoskeletal:     Right lower leg: No edema.     Left lower leg: No edema.     Comments: Patient able to move bilateral upper extremities as well as lower extremities no severe tenderness with palpation of bilateral arms or legs, pelvis  feels stable, no chest wall tenderness or abdominal tenderness  Skin:    General: Skin is warm.     Capillary Refill: Capillary refill takes less than 2 seconds.     Comments: Abrasions present to bilateral upper and lower extremities, laceration present to forehead and bridge of nose   Neurological:     Mental Status: He is alert. Mental status is at baseline.  Psychiatric:        Mood and Affect: Mood normal.        Behavior: Behavior normal. Behavior is cooperative.     (all labs ordered are listed, but only abnormal results are displayed) Labs Reviewed  CBC WITH DIFFERENTIAL/PLATELET - Abnormal; Notable for the following components:      Result Value   WBC 11.5 (*)    RBC 4.11 (*)    Neutro Abs 8.9 (*)    All other components within normal limits  COMPREHENSIVE METABOLIC PANEL WITH GFR - Abnormal; Notable for the following components:   Sodium 133 (*)    Glucose, Bld 128 (*)    Calcium 7.9 (*)    Total Protein 6.2 (*)    Albumin 3.1 (*)    All other components within normal limits  URINALYSIS, ROUTINE W REFLEX MICROSCOPIC - Abnormal; Notable for the following components:   Leukocytes,Ua TRACE (*)    All other components within normal limits  PROTIME-INR  APTT  CBC WITH DIFFERENTIAL/PLATELET  COMPREHENSIVE METABOLIC PANEL WITH GFR  MAGNESIUM  TYPE AND SCREEN  PREPARE RBC (CROSSMATCH)  ABO/RH    EKG: EKG Interpretation Date/Time:  Tuesday June 13 2024 19:34:43 EST Ventricular Rate:  73 PR Interval:  179 QRS Duration:  95 QT Interval:  395 QTC Calculation: 436 R Axis:   82  Text Interpretation: Sinus arrhythmia Consider left atrial enlargement Borderline right axis deviation Confirmed by Darra Chew [phone]) on 06/13/2024 7:38:16 PM  Radiology: CT Head Wo Contrast Result Date: 06/13/2024 EXAM: CT HEAD, FACIAL BONES AND CERVICAL SPINE WITHOUT CONTRAST 06/13/2024 07:30:00 PM TECHNIQUE: CT of the head, facial bones and cervical spine was performed without the  administration of intravenous contrast. Multiplanar reformatted images are provided for review. Automated exposure control, iterative reconstruction, and/or weight based adjustment of the mA/kV was utilized to reduce the radiation dose to as low as reasonably achievable. COMPARISON: Ct cspine 10/02/20 CT chest 05/12/23 CLINICAL HISTORY: Unwitnessed fall, head injury. FINDINGS: CT HEAD BRAIN AND VENTRICLES: Atherosclerotic calcifications are present within the cavernous internal carotid and vertebral arteries. No acute intracranial hemorrhage. No mass effect or midline shift. No extra-axial fluid collection. No evidence of acute infarct. No hydrocephalus. SKULL AND SCALP: No acute skull fracture. No scalp hematoma. CT FACIAL BONES FACIAL BONES: Acute bilateral nasal bone fractures. No mandibular dislocation. No suspicious bone lesion. ORBITS: No acute traumatic injury. SINUSES AND MASTOIDS:  Polypoid-like left maxillary sinus mucosal thickening. The right maxillary sinus mucosal thickening. Right frontal sinus mucosal thickening. SOFT TISSUES: No acute abnormality. CT CERVICAL SPINE BONES AND ALIGNMENT: 1.3 cm anterior displacement of an acute displaced C2 dens fracture with associated perching of the C1 and C2 facets. Associated central canal stenosis at the C1-C2 level. Multilevel moderate degenerative changes of the spine. Associated right severe osseous neural foraminal stenosis at the C3-C4 level. Partial osseous fusion of the C2-C3 levels. DEGENERATIVE CHANGES: No significant degenerative changes. SOFT TISSUES: No prevertebral soft tissue swelling. Cavitary lesions versus bronchiectasis within the right upper lobe with associated nodular opacities As an example, a 1.5x1 cm cavitary lesion (4.99). IMPRESSION: 1. 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 Emergent neurosurgery consultation  recommended. 2. Acute bilateral nasal bone fractures. 3. No acute intracranial abnormality . 4. Cavitary lesions versus bronchiectasis within the right upper lobe. Findings worsened from CT chest 05/12/23 within the right upper lobe. These details were discussed with Dr. darra by Dr. Margarite over the phone on 06/13/2024 at 7:51 pm Electronically signed by: Morgane Naveau MD 06/13/2024 07:59 PM EST RP Workstation: [id number]   CT Cervical Spine Wo Contrast Result Date: 06/13/2024 EXAM: CT HEAD, FACIAL BONES AND CERVICAL SPINE WITHOUT CONTRAST 06/13/2024 07:30:00 PM TECHNIQUE: CT of the head, facial bones and cervical spine was performed without the administration of intravenous contrast. Multiplanar reformatted images are provided for review. Automated exposure control, iterative reconstruction, and/or weight based adjustment of the mA/kV was utilized to reduce the radiation dose to as low as reasonably achievable. COMPARISON: Ct cspine 10/02/20 CT chest 05/12/23 CLINICAL HISTORY: Unwitnessed fall, head injury. FINDINGS: CT HEAD BRAIN AND VENTRICLES: Atherosclerotic calcifications are present within the cavernous internal carotid and vertebral arteries. No acute intracranial hemorrhage. No mass effect or midline shift. No extra-axial fluid collection. No evidence of acute infarct. No hydrocephalus. SKULL AND SCALP: No acute skull fracture. No scalp hematoma. CT FACIAL BONES FACIAL BONES: Acute bilateral nasal bone fractures. No mandibular dislocation. No suspicious bone lesion. ORBITS: No acute traumatic injury. SINUSES AND MASTOIDS: Polypoid-like left maxillary sinus mucosal thickening. The right maxillary sinus mucosal thickening. Right frontal sinus mucosal thickening. SOFT TISSUES: No acute abnormality. CT CERVICAL SPINE BONES AND ALIGNMENT: 1.3 cm anterior displacement of an acute displaced C2 dens fracture with associated perching of the C1 and C2 facets. Associated central canal stenosis at the C1-C2 level.  Multilevel moderate degenerative changes of the spine. Associated right severe osseous neural foraminal stenosis at the C3-C4 level. Partial osseous fusion of the C2-C3 levels. DEGENERATIVE CHANGES: No significant degenerative changes. SOFT TISSUES: No prevertebral soft tissue swelling. Cavitary lesions versus bronchiectasis within the right upper lobe with associated nodular opacities As an example, a 1.5x1 cm cavitary lesion (4.99). IMPRESSION: 1. 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 Emergent neurosurgery consultation recommended. 2. Acute bilateral nasal bone fractures. 3. No acute intracranial abnormality . 4. Cavitary lesions versus bronchiectasis within the right upper lobe. Findings worsened from CT chest 05/12/23 within the right upper lobe. These details were discussed with Dr. darra by Dr. Margarite over the phone on 06/13/2024 at 7:51 pm Electronically signed by: Morgane Naveau MD 06/13/2024 07:59 PM EST RP Workstation: [id number]   CT Maxillofacial Wo Contrast Result Date: 06/13/2024 EXAM:  CT HEAD, FACIAL BONES AND CERVICAL SPINE WITHOUT CONTRAST 06/13/2024 07:30:00 PM TECHNIQUE: CT of the head, facial bones and cervical spine was performed without the administration of intravenous contrast. Multiplanar reformatted images are provided for review. Automated exposure control, iterative reconstruction, and/or weight based adjustment of the mA/kV was utilized to reduce the radiation dose to as low as reasonably achievable. COMPARISON: Ct cspine 10/02/20 CT chest 05/12/23 CLINICAL HISTORY: Unwitnessed fall, head injury. FINDINGS: CT HEAD BRAIN AND VENTRICLES: Atherosclerotic calcifications are present within the cavernous internal carotid and vertebral arteries. No acute intracranial hemorrhage. No mass effect or midline shift. No extra-axial fluid collection. No evidence of acute  infarct. No hydrocephalus. SKULL AND SCALP: No acute skull fracture. No scalp hematoma. CT FACIAL BONES FACIAL BONES: Acute bilateral nasal bone fractures. No mandibular dislocation. No suspicious bone lesion. ORBITS: No acute traumatic injury. SINUSES AND MASTOIDS: Polypoid-like left maxillary sinus mucosal thickening. The right maxillary sinus mucosal thickening. Right frontal sinus mucosal thickening. SOFT TISSUES: No acute abnormality. CT CERVICAL SPINE BONES AND ALIGNMENT: 1.3 cm anterior displacement of an acute displaced C2 dens fracture with associated perching of the C1 and C2 facets. Associated central canal stenosis at the C1-C2 level. Multilevel moderate degenerative changes of the spine. Associated right severe osseous neural foraminal stenosis at the C3-C4 level. Partial osseous fusion of the C2-C3 levels. DEGENERATIVE CHANGES: No significant degenerative changes. SOFT TISSUES: No prevertebral soft tissue swelling. Cavitary lesions versus bronchiectasis within the right upper lobe with associated nodular opacities As an example, a 1.5x1 cm cavitary lesion (4.99). IMPRESSION: 1. 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 Emergent neurosurgery consultation recommended. 2. Acute bilateral nasal bone fractures. 3. No acute intracranial abnormality . 4. Cavitary lesions versus bronchiectasis within the right upper lobe. Findings worsened from CT chest 05/12/23 within the right upper lobe. These details were discussed with Dr. darra by Dr. Margarite over the phone on 06/13/2024 at 7:51 pm Electronically signed by: Morgane Naveau MD 06/13/2024 07:59 PM EST RP Workstation: [id number]     .Laceration Repair  Date/Time: 06/14/2024 12:42 AM  Performed by: Janetta Terrall FALCON, PA-C Authorized by: Janetta Terrall FALCON, PA-C   Consent:    Consent obtained:  Verbal and written   Consent  given by:  Patient   Risks, benefits, and alternatives were discussed: yes     Risks discussed:  Infection, pain, poor cosmetic result, retained foreign body, tendon damage, vascular damage, poor wound healing, nerve damage and need for additional repair   Alternatives discussed:  No treatment and observation Universal protocol:    Procedure explained and questions answered to patient or proxy's satisfaction: yes     Patient identity confirmed:  Verbally with patient and arm band Anesthesia:    Anesthesia method:  Local infiltration   Local anesthetic:  Lidocaine  2% WITH epi Laceration details:    Location:  Face   Face location:  Forehead   Length (cm):  3.8 Exploration:    Limited defect created (wound extended): yes   Treatment:    Area cleansed with:  Povidone-iodine and Shur-Clens   Amount of cleaning:  Standard Skin repair:    Repair method:  Sutures   Suture size:  5-0   Suture material:  Nylon   Suture technique:  Simple interrupted   Number of sutures:  7 Approximation:    Approximation:  Close Repair type:  Repair type:  Intermediate Post-procedure details:    Dressing:  Open (no dressing)   Procedure completion:  Tolerated well, no immediate complications .Critical Care  Performed by: Janetta Terrall FALCON, PA-C Authorized by: Janetta Terrall FALCON, PA-C   Critical care provider statement:    Critical care time (minutes):  60   Critical care was necessary to treat or prevent imminent or life-threatening deterioration of the following conditions:  Trauma   Critical care was time spent personally by me on the following activities:  Blood draw for specimens, development of treatment plan with patient or surrogate, discussions with consultants, evaluation of patient's response to treatment, examination of patient, obtaining history from patient or surrogate, ordering and performing treatments and interventions, ordering and review of laboratory studies, ordering and review of  radiographic studies, pulse oximetry, re-evaluation of patient's condition, review of old charts and transcutaneous pacing   Care discussed with: admitting provider      Medications Ordered in the ED  diazepam  (VALIUM ) injection 5 mg (has no administration in time range)  acetaminophen  (TYLENOL ) tablet 650 mg (has no administration in time range)    Or  acetaminophen  (TYLENOL ) suppository 650 mg (has no administration in time range)  melatonin tablet 3 mg (has no administration in time range)  ondansetron  (ZOFRAN ) injection 4 mg (has no administration in time range)  naloxone  (NARCAN ) injection 0.4 mg (has no administration in time range)  fentaNYL  (SUBLIMAZE ) injection 25 mcg (has no administration in time range)  fluticasone  furoate-vilanterol (BREO ELLIPTA ) 100-25 MCG/ACT 1 puff (has no administration in time range)  albuterol  (PROVENTIL ) (2.5 MG/3ML) 0.083% nebulizer solution 2.5 mg (has no administration in time range)  sodium chloride  0.9 % bolus 500 mL (0 mLs Intravenous Stopped 06/13/24 1959)  fentaNYL  (SUBLIMAZE ) injection 25 mcg (25 mcg Intravenous Given 06/13/24 2004)  lidocaine -EPINEPHrine  (XYLOCAINE  W/EPI) 2 %-1:200000 (PF) injection 10 mL (10 mLs Infiltration Given 06/13/24 2213)  iohexol  (OMNIPAQUE ) 350 MG/ML injection 75 mL (75 mLs Intravenous Contrast Given 06/14/24 0044)                                    Medical Decision Making Amount and/or Complexity of Data Reviewed Labs: ordered. Radiology: ordered.  Risk Prescription drug management. Decision regarding hospitalization.   Patient presents to the ED for concern of fall, this involves an extensive number of treatment options, and is a complaint that carries with it a high risk of complications and morbidity.  The differential diagnosis includes skull fracture, brain bleed, facial fracture, cervical spine fracture, extremity injury, etc.   Co morbidities that complicate the patient  evaluation  Hyperlipidemia, history of COPD, history of dementia, hearing loss of both ears, tinnitus, asthma, etc.   Additional history obtained:  Additional history obtained from EMS report and daughters   Lab Tests:  I Ordered, and personally interpreted labs.  The pertinent results include: Lab workup significant for white blood cell count of 11.5, hemoglobin of 13.2, CMP significant for sodium 133, glucose 128, calcium 7.9, protein 6.2, albumin 3.1, urinalysis not consistent with infection   Imaging Studies ordered:  I ordered imaging studies including CT head, cervical spine, maxillofacial I independently visualized and interpreted imaging which showed: Unstable acute displaced C2 dens fracture with anterior displacement and perching of C1 and C2 facets resulting in central canal stenosis at the C1-C2 level, recommend emergent neurosurgery consultation, acute bilateral nasal bone fractures, no acute intracranial abnormality, cavitary  lesions versus bronchiectasis within the right upper lobe that have worsened from previous CT I agree with the radiologist interpretation   Medicines ordered and prescription drug management:  I ordered medication including fentanyl  for pain, fluids, lidocaine  for suture repair Reevaluation of the patient after these medicines showed that the patient stayed the same I have reviewed the patients home medicines and have made adjustments as needed   Test Considered:  Extremity imaging: Declined at this time as patient has good range of motion of all 4 extremities against gravity as well as resistance, no tenderness with palpation that makes me clinically suspicious for extremity fracture Chest x-ray/pelvis x-ray: Declined as patient has no chest wall tenderness, abdominal tenderness, or pelvic tenderness   Critical Interventions:  Emergent neurosurgery evaluation   Consultations Obtained:  I requested consultation with the neurosurgery team  and hospitalist team and discussed lab and imaging findings as well as pertinent plan - they recommend: Patient the hospital for ongoing diagnosis and treatment   Problem List / ED Course:  89 year old male, vital signs stable, presents emergency department with a chief complaint of unwitnessed fall, difficult history to obtain as patient has history of dementia and lives in assisted living facility On physical exam extensive injuries to face and scalp, limited neck exam as patient is in c-collar however patient complaining of severe neck pain, will obtain baseline labs as well as imaging, patient is able to move bilateral upper and lower extremities, exam was limited due to C-collar, I did not have the patient sit up and lean forward due to concern with neck fracture, I was not able to palpate the spine however patient denies back pain appreciating only neck pain [facility] radiology called due to emergent findings on CT cervical spine, significant for C1 and C2 fracture Spoke with Dr. Darnella with neurosurgery who will evaluate the patient Extensive discussion with both of patients daughters making them aware of workup and findings and plan currently Per Dr. Darnella, plan for surgery tomorrow, admit to medicine, [place] J collar ordered Forehead laceration repaired by myself with 7 non-absorbable sutures, wound well-approximated and patient tolerated procedure well Spoke with Dr. Marcene who agrees with admission for ongoing diagnosis and treatment, most likely diagnosis at this time is fall leading to acute cervical spine injury, multiple lacerations and abrasions   Reevaluation:  After the interventions noted above, I reevaluated the patient and found that they have :stayed the same   Social Determinants of Health:  none   Dispostion:  After consideration of the diagnostic results and the patients response to treatment, I feel that the patent would benefit from admission to the hospital for  ongoing diagnosis and treatment.     Final diagnoses:  Closed displaced fracture of second cervical vertebra, unspecified fracture morphology, initial encounter [facility])  Closed fracture of nasal bone, initial encounter  Fall, initial encounter  Injury of head, initial encounter  Forehead laceration, initial encounter    ED Discharge Orders     None          Janetta Terrall FALCON, PA-C 06/14/24 0048    Janetta Terrall FALCON, PA-C 06/14/24 0057    Darra Fonda MATSU, MD 06/17/24 0300

## 2024-06-13 NOTE — ED Notes (Signed)
 Patient transported to MRI

## 2024-06-13 NOTE — ED Provider Notes (Incomplete)
 [place] EMERGENCY DEPARTMENT AT [facility] Provider Note   CSN: [id number] Arrival date & time: 06/13/24  8167     Patient presents with: Fall (/) and Head Injury   Adrian Ray is a 89 y.o. male with a history of dementia who lives at [facility] who presents to the emergency department via EMS due to unwitnessed fall.  Per EMS report patient fell face forward onto concrete.  Patient history extremely limited due to history of dementia as well as history of sensorineural hearing loss.  Having to communicate with the patient at this time using pen and paper.  Patient appreciates severe neck pain.  Denies blood thinning medication.  Per EMS patient has abrasions to bilateral knees, hands, as well as a busted lip.  Laceration on forehead as well as bridge of nose.  Past medical history significant for dementia, hearing loss, diverticulosis, asthma, cataracts, GERD, hyperlipidemia, etc.  {Add pertinent medical, surgical, social history, OB history to YEP:67052}  Fall  Head Injury      Prior to Admission medications   Medication Sig Start Date End Date Taking? Authorizing Provider  acetaminophen  (TYLENOL ) 500 MG tablet Take 1,000 mg by mouth every 8 (eight) hours. Patient not taking: Reported on 06/08/2024    [provider]  albuterol  (VENTOLIN  HFA) 108 (90 Base) MCG/ACT inhaler Inhale 2 puffs into the lungs every 6 (six) hours as needed for wheezing or shortness of breath. 05/31/23   Geronimo Amel, MD  amoxicillin (AMOXIL) 500 MG tablet Take by mouth. Patient not taking: Reported on 06/08/2024 10/19/22   [provider]  finasteride (PROSCAR) 5 MG tablet Take 5 mg by mouth daily. 04/20/22   [provider]  fluticasone  (FLONASE ) 50 MCG/ACT nasal spray Place 1 spray into both nostrils daily as needed for allergies. 01/16/16   [provider]  fluticasone  (FLONASE ) 50 MCG/ACT nasal spray Place 2 sprays into both nostrils  daily. 03/07/24 06/08/24  [name], MD  fluticasone  furoate-vilanterol (BREO ELLIPTA ) 100-25 MCG/ACT AEPB Inhale 1 puff into the lungs daily. 05/31/23   [name], [name], MD  rosuvastatin (CRESTOR) 5 MG tablet Take 1 tablet by mouth 3 (three) times a week. Monday Wednesday Friday 10/03/20   [provider]  tamsulosin (FLOMAX) 0.4 MG CAPS capsule Take by mouth. 05/15/22   [provider]    Allergies: Tetracycline and Codeine    Review of Systems  HENT:         Neck pain    Updated Vital Signs BP (!) 154/70   Pulse 79   Temp 97.6 F (36.4 C) (Oral)   Resp 16   SpO2 97%   Physical Exam Vitals and nursing note reviewed.  Constitutional:      General: He is awake. He is not in acute distress.    Appearance: Normal appearance. He is not ill-appearing, toxic-appearing or diaphoretic.  HENT:     Head:     Comments: Laceration present to forehead as well as bridge of nose, ecchymosis surrounding R eye    Ears:     Comments: Chronic sensoneural hearing loss    Mouth/Throat:     Comments: Busted lip Neck:     Comments: Patient in C-collar at time of exam Pulmonary:     Effort: Pulmonary effort is normal. No respiratory distress.  Chest:     Chest wall: No tenderness.  Abdominal:     General: Abdomen is flat. There is no distension.     Palpations:  Abdomen is soft.     Tenderness: There is no abdominal tenderness. There is no right CVA tenderness, left CVA tenderness or guarding.  Musculoskeletal:     Right lower leg: No edema.     Left lower leg: No edema.     Comments: Patient able to move bilateral upper extremities, no severe tenderness with palpation of bilateral arms or legs, pelvis feels stable, no chest wall tenderness or abdominal tenderness  Did not evaluate strength of legs or leg movement due to neck injury  Skin:    General: Skin is warm.     Capillary Refill: Capillary refill takes less than 2 seconds.     Comments: Abrasions present to  bilateral upper and lower extremities, laceration present to forehead and bridge of nose   Neurological:     Mental Status: He is alert. Mental status is at baseline.  Psychiatric:        Mood and Affect: Mood normal.        Behavior: Behavior normal. Behavior is cooperative.     (all labs ordered are listed, but only abnormal results are displayed) Labs Reviewed  CBC WITH DIFFERENTIAL/PLATELET - Abnormal; Notable for the following components:      Result Value   WBC 11.5 (*)    RBC 4.11 (*)    Neutro Abs 8.9 (*)    All other components within normal limits  COMPREHENSIVE METABOLIC PANEL WITH GFR - Abnormal; Notable for the following components:   Sodium 133 (*)    Glucose, Bld 128 (*)    Calcium 7.9 (*)    Total Protein 6.2 (*)    Albumin 3.1 (*)    All other components within normal limits  URINALYSIS, ROUTINE W REFLEX MICROSCOPIC    EKG: EKG Interpretation Date/Time:  Tuesday June 13 2024 19:34:43 EST Ventricular Rate:  73 PR Interval:  179 QRS Duration:  95 QT Interval:  395 QTC Calculation: 436 R Axis:   82  Text Interpretation: Sinus arrhythmia Consider left atrial enlargement Borderline right axis deviation Confirmed by Darra Chew [phone]) on 06/13/2024 7:38:16 PM  Radiology: CT Head Wo Contrast Result Date: 06/13/2024 EXAM: CT HEAD, FACIAL BONES AND CERVICAL SPINE WITHOUT CONTRAST 06/13/2024 07:30:00 PM TECHNIQUE: CT of the head, facial bones and cervical spine was performed without the administration of intravenous contrast. Multiplanar reformatted images are provided for review. Automated exposure control, iterative reconstruction, and/or weight based adjustment of the mA/kV was utilized to reduce the radiation dose to as low as reasonably achievable. COMPARISON: Ct cspine 10/02/20 CT chest 05/12/23 CLINICAL HISTORY: Unwitnessed fall, head injury. FINDINGS: CT HEAD BRAIN AND VENTRICLES: Atherosclerotic calcifications are present within the cavernous internal  carotid and vertebral arteries. No acute intracranial hemorrhage. No mass effect or midline shift. No extra-axial fluid collection. No evidence of acute infarct. No hydrocephalus. SKULL AND SCALP: No acute skull fracture. No scalp hematoma. CT FACIAL BONES FACIAL BONES: Acute bilateral nasal bone fractures. No mandibular dislocation. No suspicious bone lesion. ORBITS: No acute traumatic injury. SINUSES AND MASTOIDS: Polypoid-like left maxillary sinus mucosal thickening. The right maxillary sinus mucosal thickening. Right frontal sinus mucosal thickening. SOFT TISSUES: No acute abnormality. CT CERVICAL SPINE BONES AND ALIGNMENT: 1.3 cm anterior displacement of an acute displaced C2 dens fracture with associated perching of the C1 and C2 facets. Associated central canal stenosis at the C1-C2 level. Multilevel moderate degenerative changes of the spine. Associated right severe osseous neural foraminal stenosis at the C3-C4 level. Partial osseous fusion of the C2-C3 levels.  DEGENERATIVE CHANGES: No significant degenerative changes. SOFT TISSUES: No prevertebral soft tissue swelling. Cavitary lesions versus bronchiectasis within the right upper lobe with associated nodular opacities As an example, a 1.5x1 cm cavitary lesion (4.99). IMPRESSION: 1. 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 Emergent neurosurgery consultation recommended. 2. Acute bilateral nasal bone fractures. 3. No acute intracranial abnormality . 4. Cavitary lesions versus bronchiectasis within the right upper lobe. Findings worsened from CT chest 05/12/23 within the right upper lobe. These details were discussed with Dr. darra by Dr. Margarite over the phone on 06/13/2024 at 7:51 pm Electronically signed by: Morgane Naveau MD 06/13/2024 07:59 PM EST RP Workstation: [id number]   CT Cervical Spine Wo Contrast Result Date:  06/13/2024 EXAM: CT HEAD, FACIAL BONES AND CERVICAL SPINE WITHOUT CONTRAST 06/13/2024 07:30:00 PM TECHNIQUE: CT of the head, facial bones and cervical spine was performed without the administration of intravenous contrast. Multiplanar reformatted images are provided for review. Automated exposure control, iterative reconstruction, and/or weight based adjustment of the mA/kV was utilized to reduce the radiation dose to as low as reasonably achievable. COMPARISON: Ct cspine 10/02/20 CT chest 05/12/23 CLINICAL HISTORY: Unwitnessed fall, head injury. FINDINGS: CT HEAD BRAIN AND VENTRICLES: Atherosclerotic calcifications are present within the cavernous internal carotid and vertebral arteries. No acute intracranial hemorrhage. No mass effect or midline shift. No extra-axial fluid collection. No evidence of acute infarct. No hydrocephalus. SKULL AND SCALP: No acute skull fracture. No scalp hematoma. CT FACIAL BONES FACIAL BONES: Acute bilateral nasal bone fractures. No mandibular dislocation. No suspicious bone lesion. ORBITS: No acute traumatic injury. SINUSES AND MASTOIDS: Polypoid-like left maxillary sinus mucosal thickening. The right maxillary sinus mucosal thickening. Right frontal sinus mucosal thickening. SOFT TISSUES: No acute abnormality. CT CERVICAL SPINE BONES AND ALIGNMENT: 1.3 cm anterior displacement of an acute displaced C2 dens fracture with associated perching of the C1 and C2 facets. Associated central canal stenosis at the C1-C2 level. Multilevel moderate degenerative changes of the spine. Associated right severe osseous neural foraminal stenosis at the C3-C4 level. Partial osseous fusion of the C2-C3 levels. DEGENERATIVE CHANGES: No significant degenerative changes. SOFT TISSUES: No prevertebral soft tissue swelling. Cavitary lesions versus bronchiectasis within the right upper lobe with associated nodular opacities As an example, a 1.5x1 cm cavitary lesion (4.99). IMPRESSION: 1. Unstable acute  displaced C2 dens fracture with 1.3 cm anterior displacement and perching of the C1 and C2 facets resulting in central canal stenosis at the C1-C2 level. Emergent neurosurgery consultation recommended. 2. Acute bilateral nasal bone fractures. 3. No acute intracranial abnormality . 4. Cavitary lesions versus bronchiectasis within the right upper lobe. Findings worsened from CT chest 05/12/23 within the right upper lobe. These details were discussed with Dr. darra by Dr. Margarite over the phone on 06/13/2024 at 7:51 pm Electronically signed by: Morgane Naveau MD 06/13/2024 07:59 PM EST RP Workstation: [id number]   CT Maxillofacial Wo Contrast Result Date: 06/13/2024 EXAM: CT HEAD, FACIAL BONES AND CERVICAL SPINE WITHOUT CONTRAST 06/13/2024 07:30:00 PM TECHNIQUE: CT of the head, facial bones and cervical spine was performed without the administration of intravenous contrast. Multiplanar reformatted images are provided for review. Automated exposure control, iterative reconstruction, and/or weight based adjustment of the mA/kV was utilized to reduce the radiation dose to as low as reasonably achievable. COMPARISON: Ct cspine 10/02/20 CT chest 05/12/23 CLINICAL HISTORY: Unwitnessed fall, head injury. FINDINGS: CT HEAD BRAIN AND VENTRICLES:  Atherosclerotic calcifications are present within the cavernous internal carotid and vertebral arteries. No acute intracranial hemorrhage. No mass effect or midline shift. No extra-axial fluid collection. No evidence of acute infarct. No hydrocephalus. SKULL AND SCALP: No acute skull fracture. No scalp hematoma. CT FACIAL BONES FACIAL BONES: Acute bilateral nasal bone fractures. No mandibular dislocation. No suspicious bone lesion. ORBITS: No acute traumatic injury. SINUSES AND MASTOIDS: Polypoid-like left maxillary sinus mucosal thickening. The right maxillary sinus mucosal thickening. Right frontal sinus mucosal thickening. SOFT TISSUES: No acute abnormality. CT CERVICAL SPINE BONES  AND ALIGNMENT: 1.3 cm anterior displacement of an acute displaced C2 dens fracture with associated perching of the C1 and C2 facets. Associated central canal stenosis at the C1-C2 level. Multilevel moderate degenerative changes of the spine. Associated right severe osseous neural foraminal stenosis at the C3-C4 level. Partial osseous fusion of the C2-C3 levels. DEGENERATIVE CHANGES: No significant degenerative changes. SOFT TISSUES: No prevertebral soft tissue swelling. Cavitary lesions versus bronchiectasis within the right upper lobe with associated nodular opacities As an example, a 1.5x1 cm cavitary lesion (4.99). IMPRESSION: 1. 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 Emergent neurosurgery consultation recommended. 2. Acute bilateral nasal bone fractures. 3. No acute intracranial abnormality . 4. Cavitary lesions versus bronchiectasis within the right upper lobe. Findings worsened from CT chest 05/12/23 within the right upper lobe. These details were discussed with Dr. darra by Dr. Margarite over the phone on 06/13/2024 at 7:51 pm Electronically signed by: Morgane Naveau MD 06/13/2024 07:59 PM EST RP Workstation: [id number]    {Document cardiac monitor, telemetry assessment procedure when appropriate:32947} Procedures   Medications Ordered in the ED  sodium chloride  0.9 % bolus 500 mL (0 mLs Intravenous Stopped 06/13/24 1959)  fentaNYL (SUBLIMAZE) injection 25 mcg (25 mcg Intravenous Given 06/13/24 2004)      {Click here for ABCD2, HEART and other calculators REFRESH Note before signing:1}                              Medical Decision Making Amount and/or Complexity of Data Reviewed Labs: ordered. Radiology: ordered.  Risk Prescription drug management.     Patient presents to the ED for concern of ***, this involves an extensive number of treatment options, and is  a complaint that carries with it a high risk of complications and morbidity.  The differential diagnosis includes ***   Co morbidities that complicate the patient evaluation  ***   Additional history obtained:  Additional history obtained from *** {Blank multiple:19196::EMS,Family,Nursing,Outside Medical Records,Past Admission}   External records from outside source obtained and reviewed including ***   Lab Tests:  I Ordered, and personally interpreted labs.  The pertinent results include:  ***   Imaging Studies ordered:  I ordered imaging studies including ***  I independently visualized and interpreted imaging which showed *** I agree with the radiologist interpretation   Cardiac Monitoring:  The patient was maintained on a cardiac monitor.  I personally viewed and interpreted the cardiac monitored which showed an underlying rhythm of: ***   Medicines ordered and prescription drug management:  I ordered medication including ***  for ***  Reevaluation of the patient after these medicines showed that the patient {resolved/improved/worsened:23923::improved} I have reviewed the patients home medicines and have made adjustments as needed   Test Considered:  ***  Critical Interventions:  ***   Consultations Obtained:  I requested consultation with the ***,  and discussed lab and imaging findings as well as pertinent plan - they recommend: ***   Problem List / ED Course:  Spoke with Dr. Darnella with neurosurgery who will evaluate the patient  Per Dr. Darnella, plan for surgery tomorrow, admit to medicine, [place] J collar,  Forehead laceration repaired by myself with 8 non-absorbable sutures   Reevaluation:  After the interventions noted above, I reevaluated the patient and found that they have :{resolved/improved/worsened:23923::improved}   Social Determinants of Health:  ***   Dispostion:  After consideration of the diagnostic results and  the patients response to treatment, I feel that the patent would benefit from ***.    {Document critical care time when appropriate  Document review of labs and clinical decision tools ie CHADS2VASC2, etc  Document your independent review of radiology images and any outside records  Document your discussion with family members, caretakers and with consultants  Document social determinants of health affecting pt's care  Document your decision making why or why not admission, treatments were needed:32947:::1}   Final diagnoses:  None    ED Discharge Orders     None

## 2024-06-13 NOTE — Consult Note (Signed)
 Neurosurgery Consult Note  Assessment:  89 year old male with high functioning recent diagnosis of dementia who presents after ground-level fall and imaging showing severely displaced odontoid fracture without neurologic injury.  See HPI for discussion with family  Plan:  Posterior C1-2 fusion tomorrow afternoon MRI cervical spine CT angio neck Bedrest, head of bed as tolerated, [place] J collar N.p.o. at midnight Preoperative labs Hold all coagulation and antiplatelets Recommend admission to general floor under hospitalist service.  Neurosurgery to follow     CC: Neck pain  HPI:     Patient is a 89 y.o. male w/ hx relatively high functioning dementia (recent diagnosis, baseline can toilet himself, ambulate independently, dress himself, feed himself, rode a motorcycle as recently as 5 weeks) who presents after GLF with neck hyperextension. CT shows a posteriorly displaced odontoid fracture. Patient is hard of hearing and can only communicate via writing on a piece of paper. He has a hearing aid at bedside but still struggles to hear.  He denies focal motor or sensory deficit.  He does have neck pain.  He lives alone at a assisted living facility  I had a detailed discussion with his daughters over the phone.  This fracture is severely displaced and will not heal without surgery. The 2 options are posterior C1-2 fusion versus collar for life and somewhat of a comfort care scenario.  I explained the risks of the procedure including general anesthesia, mortality, spinal cord injury, nerve root injury, CSF leak, bleeding, infection, hardware failure, postop delirium, postop hospital complications.  I explained the patient will lose a significant amount of his neck range of motion.  The patient is at high risk of complications due to his age and severity of fracture.  The daughters verbalized understanding and very clearly wanted to pursue surgery.  I explained this to the patient via writing  details on a piece of paper.  The patient verbalized understanding.   Patient Active Problem List   Diagnosis Date Noted   Hypertrophy of nasal turbinates 03/07/2024   Chronic rhinitis 03/07/2024   Asthma 08/09/2023   Bronchiectasis without complication (HCC) 08/09/2023   Benign prostatic hyperplasia with lower urinary tract symptoms 11/19/2022   Delirium 10/23/2020   Leukocytosis 10/23/2020   Chest pain 10/23/2020   Acute UTI 10/22/2020   Actinic keratosis 01/16/2016   Acute bronchitis 01/16/2016   Allergic rhinitis 01/16/2016   Diverticulosis of large intestine without perforation or abscess without bleeding 01/16/2016   Frequency of micturition 01/16/2016   Hearing loss of both ears 01/16/2016   History of colonic polyps 01/16/2016   Hyperlipidemia 01/16/2016   Nocturia 01/16/2016   Osteoarthritis of knee, unspecified 01/16/2016   Other general symptoms and signs 01/16/2016   Other long term (current) drug therapy 01/16/2016   Pain in shoulder 01/16/2016   Polyp of nasal cavity 01/16/2016   Pure hypercholesterolemia 01/16/2016   Uncomplicated asthma 01/16/2016   Deviated nasal septum 07/29/2012   Nasal obstruction 07/29/2012   Eustachian tube dysfunction 07/29/2012   Sensorineural hearing loss, bilateral 07/29/2012   Tinnitus 07/29/2012   Past Medical History:  Diagnosis Date   Cataracts, bilateral    GERD (gastroesophageal reflux disease)    Hyperlipemia     Past Surgical History:  Procedure Laterality Date   APPENDECTOMY      (Not in a hospital admission)  Allergies  Allergen Reactions   Tetracycline     Other reaction(s): Other (See Comments), Unknown Lips swelled    Codeine     Other  reaction(s): Unknown    Social History   Tobacco Use   Smoking status: Never   Smokeless tobacco: Never  Substance Use Topics   Alcohol use: Yes    Comment: Once a day. Beer, wine, or liquor.     No family history on file.   Review of Systems Pertinent items are  noted in HPI.  Objective:   Patient Vitals for the past 8 hrs:  BP Temp Temp src Pulse Resp SpO2  06/13/24 2030 (!) 150/88 -- -- 81 15 95 %  06/13/24 2000 (!) 154/70 -- -- 79 16 97 %  06/13/24 1930 (!) 159/81 -- -- 80 (!) 26 95 %  06/13/24 1900 (!) 156/67 -- -- 71 19 96 %  06/13/24 1841 (!) 168/86 97.6 F (36.4 C) Oral 80 19 98 %  06/13/24 1829 -- -- -- -- -- 95 %   No intake/output data recorded. No intake/output data recorded.    Exam: GCS 4E 4V 66M  Slightly confused. Can convey symptoms and have a conversation No aphasia or dysarthria PERRL EOMI, conjugate gaze FS TM Full strength BUE and BLE  Full sensation      Data ReviewCBC:  Lab Results  Component Value Date   WBC 11.5 (H) 06/13/2024   RBC 4.11 (L) 06/13/2024   BMP:  Lab Results  Component Value Date   GLUCOSE 128 (H) 06/13/2024   CO2 23 06/13/2024   BUN 10 06/13/2024   CREATININE 0.79 06/13/2024   CALCIUM 7.9 (L) 06/13/2024

## 2024-06-13 NOTE — H&P (Signed)
 History and Physical      Adrian Ray FMW:[id number] DOB: 1933/08/23 DOA: 06/13/2024; DOS: 06/13/2024  PCP: [name], MD (Inactive) *** Patient coming from: home ***  I have personally briefly reviewed patient's old medical records in [facility]  Chief Complaint: ***  HPI: Adrian Ray is a 89 y.o. male with medical history significant for *** who is admitted to [facility] on 06/13/2024 with *** after presenting from home*** to [facility] ED complaining of ***.    ***       ***   ED Course:  Vital signs in the ED were notable for the following: ***  Labs were notable for the following: ***  Per my interpretation, EKG in ED demonstrated the following:  ***  Imaging in the ED, per corresponding formal radiology read, was notable for the following:  ***  EDP discussed patient's case with on-call neurosurgery, Dr. Darnella, who will formally consult, and is planning to take the patient to the OR tomorrow (11/19) for posterior C1/C2 fusion.   While in the ED, the following were administered: ***  Subsequently, the patient was admitted  ***  ***red    Review of Systems: As per HPI otherwise 10 point review of systems negative.   Past Medical History:  Diagnosis Date   Cataracts, bilateral    GERD (gastroesophageal reflux disease)    Hyperlipemia     Past Surgical History:  Procedure Laterality Date   APPENDECTOMY      Social History:  reports that he has never smoked. He has never used smokeless tobacco. He reports current alcohol use. He reports that he does not use drugs.   Allergies  Allergen Reactions   Tetracycline     Other reaction(s): Other (See Comments), Unknown Lips swelled    Codeine     Other reaction(s): Unknown    No family history on file.  Family history reviewed and not pertinent ***   Prior to Admission medications   Medication Sig Start Date End Date Taking? Authorizing Provider  acetaminophen  (TYLENOL ) 500 MG  tablet Take 1,000 mg by mouth every 8 (eight) hours. Patient not taking: Reported on 06/08/2024    [provider]  albuterol  (VENTOLIN  HFA) 108 (90 Base) MCG/ACT inhaler Inhale 2 puffs into the lungs every 6 (six) hours as needed for wheezing or shortness of breath. 05/31/23   Geronimo Amel, MD  amoxicillin (AMOXIL) 500 MG tablet Take by mouth. Patient not taking: Reported on 06/08/2024 10/19/22   [provider]  finasteride (PROSCAR) 5 MG tablet Take 5 mg by mouth daily. 04/20/22   [provider]  fluticasone  (FLONASE ) 50 MCG/ACT nasal spray Place 1 spray into both nostrils daily as needed for allergies. 01/16/16   [provider]  fluticasone  (FLONASE ) 50 MCG/ACT nasal spray Place 2 sprays into both nostrils daily. 03/07/24 06/08/24  [name], MD  fluticasone  furoate-vilanterol (BREO ELLIPTA ) 100-25 MCG/ACT AEPB Inhale 1 puff into the lungs daily. 05/31/23   [name], [name], MD  rosuvastatin (CRESTOR) 5 MG tablet Take 1 tablet by mouth 3 (three) times a week. Monday Wednesday Friday 10/03/20   [provider]  tamsulosin (FLOMAX) 0.4 MG CAPS capsule Take by mouth. 05/15/22   [provider]     Objective    Physical Exam: Vitals:   06/13/24 1930 06/13/24 2000 06/13/24 2030 06/13/24 2233  BP: (!) 159/81 (!) 154/70 (!) 150/88   Pulse: 80 79 81   Resp: (!) 26 16 15  Temp:    (!) 97.4 F (36.3 C)  TempSrc:    Temporal  SpO2: 95% 97% 95%     General: appears to be stated age; alert, oriented Skin: warm, dry, no rash Head:  AT/[place] Mouth:  Oral mucosa membranes appear moist, normal dentition Neck: supple; trachea midline Heart:  RRR; did not appreciate any M/R/G Lungs: CTAB, did not appreciate any wheezes, rales, or rhonchi Abdomen: + BS; soft, ND, NT Vascular: 2+ pedal pulses b/l; 2+ radial pulses b/l Extremities: no peripheral edema, no muscle wasting   ***   *** Neuro: strength and sensation intact in upper and lower  extremities b/l  *** Neuro: 5/5 strength of the proximal and distal flexors and extensors of the upper and lower extremities bilaterally; sensation intact in upper and lower extremities b/l; cranial nerves II through XII grossly intact; no pronator drift; no evidence suggestive of slurred speech, dysarthria, or facial droop; Normal muscle tone. No tremors. *** Neuro: In the setting of the patient's current mental status and associated inability to follow instructions, unable to perform full neurologic exam at this time.  As such, assessment of strength, sensation, and cranial nerves is limited at this time. Patient noted to spontaneously move all 4 extremities. No tremors.  ***        Labs on Admission: I have personally reviewed following labs and imaging studies  CBC: Recent Labs  Lab 06/13/24 1848  WBC 11.5*  NEUTROABS 8.9*  HGB 13.2  HCT 39.7  MCV 96.6  PLT 235   Basic Metabolic Panel: Recent Labs  Lab 06/13/24 1848  NA 133*  K 3.7  CL 100  CO2 23  GLUCOSE 128*  BUN 10  CREATININE 0.79  CALCIUM 7.9*   GFR: Estimated Creatinine Clearance: 73.7 mL/min (by C-G formula based on SCr of 0.79 mg/dL). Liver Function Tests: Recent Labs  Lab 06/13/24 1848  AST 22  ALT 12  ALKPHOS 94  BILITOT 0.6  PROT 6.2*  ALBUMIN 3.1*   No results for input(s): LIPASE, AMYLASE in the last 168 hours. No results for input(s): AMMONIA in the last 168 hours. Coagulation Profile: Recent Labs  Lab 06/13/24 2215  INR 1.1   Cardiac Enzymes: No results for input(s): CKTOTAL, CKMB, CKMBINDEX, TROPONINI in the last 168 hours. BNP (last 3 results) No results for input(s): PROBNP in the last 8760 hours. HbA1C: No results for input(s): HGBA1C in the last 72 hours. CBG: No results for input(s): GLUCAP in the last 168 hours. Lipid Profile: No results for input(s): CHOL, HDL, LDLCALC, TRIG, CHOLHDL, LDLDIRECT in the last 72 hours. Thyroid  Function  Tests: No results for input(s): TSH, T4TOTAL, FREET4, T3FREE, THYROIDAB in the last 72 hours. Anemia Panel: No results for input(s): VITAMINB12, FOLATE, FERRITIN, TIBC, IRON, RETICCTPCT in the last 72 hours. Urine analysis:    Component Value Date/Time   COLORURINE YELLOW 06/13/2024 2030   APPEARANCEUR CLEAR 06/13/2024 2030   LABSPEC 1.019 06/13/2024 2030   PHURINE 6.0 06/13/2024 2030   GLUCOSEU NEGATIVE 06/13/2024 2030   HGBUR NEGATIVE 06/13/2024 2030   BILIRUBINUR NEGATIVE 06/13/2024 2030   KETONESUR NEGATIVE 06/13/2024 2030   PROTEINUR NEGATIVE 06/13/2024 2030   UROBILINOGEN 1.0 05/08/2010 2151   NITRITE NEGATIVE 06/13/2024 2030   LEUKOCYTESUR TRACE (A) 06/13/2024 2030    Radiological Exams on Admission: CT Head Wo Contrast Result Date: 06/13/2024 EXAM: CT HEAD, FACIAL BONES AND CERVICAL SPINE WITHOUT CONTRAST 06/13/2024 07:30:00 PM TECHNIQUE: CT of the head, facial bones and cervical spine was performed  without the administration of intravenous contrast. Multiplanar reformatted images are provided for review. Automated exposure control, iterative reconstruction, and/or weight based adjustment of the mA/kV was utilized to reduce the radiation dose to as low as reasonably achievable. COMPARISON: Ct cspine 10/02/20 CT chest 05/12/23 CLINICAL HISTORY: Unwitnessed fall, head injury. FINDINGS: CT HEAD BRAIN AND VENTRICLES: Atherosclerotic calcifications are present within the cavernous internal carotid and vertebral arteries. No acute intracranial hemorrhage. No mass effect or midline shift. No extra-axial fluid collection. No evidence of acute infarct. No hydrocephalus. SKULL AND SCALP: No acute skull fracture. No scalp hematoma. CT FACIAL BONES FACIAL BONES: Acute bilateral nasal bone fractures. No mandibular dislocation. No suspicious bone lesion. ORBITS: No acute traumatic injury. SINUSES AND MASTOIDS: Polypoid-like left maxillary sinus mucosal thickening. The right  maxillary sinus mucosal thickening. Right frontal sinus mucosal thickening. SOFT TISSUES: No acute abnormality. CT CERVICAL SPINE BONES AND ALIGNMENT: 1.3 cm anterior displacement of an acute displaced C2 dens fracture with associated perching of the C1 and C2 facets. Associated central canal stenosis at the C1-C2 level. Multilevel moderate degenerative changes of the spine. Associated right severe osseous neural foraminal stenosis at the C3-C4 level. Partial osseous fusion of the C2-C3 levels. DEGENERATIVE CHANGES: No significant degenerative changes. SOFT TISSUES: No prevertebral soft tissue swelling. Cavitary lesions versus bronchiectasis within the right upper lobe with associated nodular opacities As an example, a 1.5x1 cm cavitary lesion (4.99). IMPRESSION: 1. 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 Emergent neurosurgery consultation recommended. 2. Acute bilateral nasal bone fractures. 3. No acute intracranial abnormality . 4. Cavitary lesions versus bronchiectasis within the right upper lobe. Findings worsened from CT chest 05/12/23 within the right upper lobe. These details were discussed with Dr. darra by Dr. Margarite over the phone on 06/13/2024 at 7:51 pm Electronically signed by: Morgane Naveau MD 06/13/2024 07:59 PM EST RP Workstation: [id number]   CT Cervical Spine Wo Contrast Result Date: 06/13/2024 EXAM: CT HEAD, FACIAL BONES AND CERVICAL SPINE WITHOUT CONTRAST 06/13/2024 07:30:00 PM TECHNIQUE: CT of the head, facial bones and cervical spine was performed without the administration of intravenous contrast. Multiplanar reformatted images are provided for review. Automated exposure control, iterative reconstruction, and/or weight based adjustment of the mA/kV was utilized to reduce the radiation dose to as low as reasonably achievable. COMPARISON: Ct cspine 10/02/20 CT chest  05/12/23 CLINICAL HISTORY: Unwitnessed fall, head injury. FINDINGS: CT HEAD BRAIN AND VENTRICLES: Atherosclerotic calcifications are present within the cavernous internal carotid and vertebral arteries. No acute intracranial hemorrhage. No mass effect or midline shift. No extra-axial fluid collection. No evidence of acute infarct. No hydrocephalus. SKULL AND SCALP: No acute skull fracture. No scalp hematoma. CT FACIAL BONES FACIAL BONES: Acute bilateral nasal bone fractures. No mandibular dislocation. No suspicious bone lesion. ORBITS: No acute traumatic injury. SINUSES AND MASTOIDS: Polypoid-like left maxillary sinus mucosal thickening. The right maxillary sinus mucosal thickening. Right frontal sinus mucosal thickening. SOFT TISSUES: No acute abnormality. CT CERVICAL SPINE BONES AND ALIGNMENT: 1.3 cm anterior displacement of an acute displaced C2 dens fracture with associated perching of the C1 and C2 facets. Associated central canal stenosis at the C1-C2 level. Multilevel moderate degenerative changes of the spine. Associated right severe osseous neural foraminal stenosis at the C3-C4 level. Partial osseous fusion of the C2-C3 levels. DEGENERATIVE CHANGES: No significant degenerative changes. SOFT TISSUES: No prevertebral soft tissue swelling. Cavitary lesions versus bronchiectasis within  the right upper lobe with associated nodular opacities As an example, a 1.5x1 cm cavitary lesion (4.99). IMPRESSION: 1. 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 Emergent neurosurgery consultation recommended. 2. Acute bilateral nasal bone fractures. 3. No acute intracranial abnormality . 4. Cavitary lesions versus bronchiectasis within the right upper lobe. Findings worsened from CT chest 05/12/23 within the right upper lobe. These details were discussed with Dr. darra by Dr. Margarite over the phone on  06/13/2024 at 7:51 pm Electronically signed by: Morgane Naveau MD 06/13/2024 07:59 PM EST RP Workstation: [id number]   CT Maxillofacial Wo Contrast Result Date: 06/13/2024 EXAM: CT HEAD, FACIAL BONES AND CERVICAL SPINE WITHOUT CONTRAST 06/13/2024 07:30:00 PM TECHNIQUE: CT of the head, facial bones and cervical spine was performed without the administration of intravenous contrast. Multiplanar reformatted images are provided for review. Automated exposure control, iterative reconstruction, and/or weight based adjustment of the mA/kV was utilized to reduce the radiation dose to as low as reasonably achievable. COMPARISON: Ct cspine 10/02/20 CT chest 05/12/23 CLINICAL HISTORY: Unwitnessed fall, head injury. FINDINGS: CT HEAD BRAIN AND VENTRICLES: Atherosclerotic calcifications are present within the cavernous internal carotid and vertebral arteries. No acute intracranial hemorrhage. No mass effect or midline shift. No extra-axial fluid collection. No evidence of acute infarct. No hydrocephalus. SKULL AND SCALP: No acute skull fracture. No scalp hematoma. CT FACIAL BONES FACIAL BONES: Acute bilateral nasal bone fractures. No mandibular dislocation. No suspicious bone lesion. ORBITS: No acute traumatic injury. SINUSES AND MASTOIDS: Polypoid-like left maxillary sinus mucosal thickening. The right maxillary sinus mucosal thickening. Right frontal sinus mucosal thickening. SOFT TISSUES: No acute abnormality. CT CERVICAL SPINE BONES AND ALIGNMENT: 1.3 cm anterior displacement of an acute displaced C2 dens fracture with associated perching of the C1 and C2 facets. Associated central canal stenosis at the C1-C2 level. Multilevel moderate degenerative changes of the spine. Associated right severe osseous neural foraminal stenosis at the C3-C4 level. Partial osseous fusion of the C2-C3 levels. DEGENERATIVE CHANGES: No significant degenerative changes. SOFT TISSUES: No prevertebral soft tissue swelling. Cavitary lesions versus  bronchiectasis within the right upper lobe with associated nodular opacities As an example, a 1.5x1 cm cavitary lesion (4.99). IMPRESSION: 1. 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 Emergent neurosurgery consultation recommended. 2. Acute bilateral nasal bone fractures. 3. No acute intracranial abnormality . 4. Cavitary lesions versus bronchiectasis within the right upper lobe. Findings worsened from CT chest 05/12/23 within the right upper lobe. These details were discussed with Dr. darra by Dr. Margarite over the phone on 06/13/2024 at 7:51 pm Electronically signed by: Morgane Naveau MD 06/13/2024 07:59 PM EST RP Workstation: [id number]      Assessment/Plan   Principal Problem:   C1-C2 vertebral instability   ***            ***       EDP discussed patient's case with on-call neurosurgery, Dr. Darnella, who will formally consult, and is planning to take the patient to the OR tomorrow (11/19) for posterior C1/C2 fusion. ***              ***                      ***                     ***                     ***                     ***                      ***                     ***                     ***                     ***                     ***                    ***                   ***  DVT prophylaxis: SCD's ***  Code Status: Full code*** Family Communication: none*** Disposition Plan: Per Rounding Team Consults called: EDP discussed patient's case with on-call neurosurgery, Dr. Darnella, who will formally consult, and is planning to take the patient to the OR tomorrow (11/19) for posterior C1/C2 fusion. ;   Admission status: ***     I SPENT GREATER THAN 75 *** MINUTES IN CLINICAL CARE TIME/MEDICAL DECISION-MAKING IN COMPLETING THIS ADMISSION.      Eva NOVAK Paisyn Guercio DO [facility]  From 7PM - 7AM   06/13/2024, 11:41 PM   ***

## 2024-06-14 ENCOUNTER — Inpatient Hospital Stay (HOSPITAL_COMMUNITY)
Admission: EM | Disposition: A | Discharge status: Rehabilitation Facility | Payer: Self-pay | Source: Skilled Nursing Facility | Attending: Family Medicine

## 2024-06-14 ENCOUNTER — Inpatient Hospital Stay (HOSPITAL_COMMUNITY): Admitting: Certified Registered"

## 2024-06-14 ENCOUNTER — Encounter (HOSPITAL_COMMUNITY): Payer: Self-pay | Admitting: Radiology

## 2024-06-14 ENCOUNTER — Inpatient Hospital Stay (HOSPITAL_COMMUNITY)

## 2024-06-14 DIAGNOSIS — S12100A Unspecified displaced fracture of second cervical vertebra, initial encounter for closed fracture: Secondary | ICD-10-CM | POA: Diagnosis present

## 2024-06-14 DIAGNOSIS — S12110A Anterior displaced Type II dens fracture, initial encounter for closed fracture: Secondary | ICD-10-CM | POA: Diagnosis not present

## 2024-06-14 DIAGNOSIS — S12000A Unspecified displaced fracture of first cervical vertebra, initial encounter for closed fracture: Secondary | ICD-10-CM | POA: Diagnosis not present

## 2024-06-14 DIAGNOSIS — M532X1 Spinal instabilities, occipito-atlanto-axial region: Secondary | ICD-10-CM | POA: Diagnosis not present

## 2024-06-14 DIAGNOSIS — Z981 Arthrodesis status: Secondary | ICD-10-CM | POA: Diagnosis not present

## 2024-06-14 DIAGNOSIS — J45909 Unspecified asthma, uncomplicated: Secondary | ICD-10-CM | POA: Diagnosis not present

## 2024-06-14 DIAGNOSIS — W19XXXA Unspecified fall, initial encounter: Secondary | ICD-10-CM | POA: Insufficient documentation

## 2024-06-14 DIAGNOSIS — I651 Occlusion and stenosis of basilar artery: Secondary | ICD-10-CM | POA: Diagnosis not present

## 2024-06-14 DIAGNOSIS — S12120A Other displaced dens fracture, initial encounter for closed fracture: Secondary | ICD-10-CM

## 2024-06-14 DIAGNOSIS — N400 Enlarged prostate without lower urinary tract symptoms: Secondary | ICD-10-CM | POA: Diagnosis not present

## 2024-06-14 DIAGNOSIS — Z8709 Personal history of other diseases of the respiratory system: Secondary | ICD-10-CM

## 2024-06-14 DIAGNOSIS — E7800 Pure hypercholesterolemia, unspecified: Secondary | ICD-10-CM

## 2024-06-14 DIAGNOSIS — M4802 Spinal stenosis, cervical region: Secondary | ICD-10-CM | POA: Diagnosis not present

## 2024-06-14 HISTORY — PX: POSTERIOR CERVICAL FUSION/FORAMINOTOMY: SHX5038

## 2024-06-14 LAB — COMPREHENSIVE METABOLIC PANEL WITH GFR
ALT: 12 U/L (ref 0–44)
AST: 20 U/L (ref 15–41)
Albumin: 3.2 g/dL — ABNORMAL LOW (ref 3.5–5.0)
Alkaline Phosphatase: 97 U/L (ref 38–126)
Anion gap: 11 (ref 5–15)
BUN: 10 mg/dL (ref 8–23)
CO2: 23 mmol/L (ref 22–32)
Calcium: 8.4 mg/dL — ABNORMAL LOW (ref 8.9–10.3)
Chloride: 98 mmol/L (ref 98–111)
Creatinine, Ser: 0.89 mg/dL (ref 0.61–1.24)
GFR, Estimated: >60 mL/min (ref >60)
Glucose, Bld: 131 mg/dL — ABNORMAL HIGH (ref 70–99)
Potassium: 4.5 mmol/L (ref 3.5–5.1)
Sodium: 132 mmol/L — ABNORMAL LOW (ref 135–145)
Total Bilirubin: 0.7 mg/dL (ref 0.0–1.2)
Total Protein: 6.8 g/dL (ref 6.5–8.1)

## 2024-06-14 LAB — CBC WITH DIFFERENTIAL/PLATELET
Abs Immature Granulocytes: 0.09 K/uL — ABNORMAL HIGH (ref 0.00–0.07)
Basophils Absolute: 0.1 K/uL (ref 0.0–0.1)
Basophils Relative: 0 %
Eosinophils Absolute: 0 K/uL (ref 0.0–0.5)
Eosinophils Relative: 0 %
HCT: 39 % (ref 39.0–52.0)
Hemoglobin: 13.2 g/dL (ref 13.0–17.0)
Immature Granulocytes: 1 %
Lymphocytes Relative: 8 %
Lymphs Abs: 1.2 K/uL (ref 0.7–4.0)
MCH: 32.6 pg (ref 26.0–34.0)
MCHC: 33.8 g/dL (ref 30.0–36.0)
MCV: 96.3 fL (ref 80.0–100.0)
Monocytes Absolute: 1 K/uL (ref 0.1–1.0)
Monocytes Relative: 7 %
Neutro Abs: 12.3 K/uL — ABNORMAL HIGH (ref 1.7–7.7)
Neutrophils Relative %: 84 %
Platelets: 236 K/uL (ref 150–400)
RBC: 4.05 MIL/uL — ABNORMAL LOW (ref 4.22–5.81)
RDW: 13.2 % (ref 11.5–15.5)
WBC: 14.8 K/uL — ABNORMAL HIGH (ref 4.0–10.5)
nRBC: 0.2 % (ref 0.0–0.2)

## 2024-06-14 LAB — SURGICAL PCR SCREEN
MRSA, PCR: NEGATIVE
Staphylococcus aureus: NEGATIVE

## 2024-06-14 LAB — MAGNESIUM: Magnesium: 2.1 mg/dL (ref 1.7–2.4)

## 2024-06-14 SURGERY — POSTERIOR CERVICAL FUSION/FORAMINOTOMY LEVEL 3
Anesthesia: General | Site: Spine Cervical

## 2024-06-14 MED ORDER — FENTANYL CITRATE (PF) 100 MCG/2ML IJ SOLN: 25.0000 ug | INTRAMUSCULAR | Status: DC | PRN

## 2024-06-14 MED ORDER — SODIUM CHLORIDE 0.9% IV SOLUTION: Freq: Once | INTRAVENOUS | Status: DC

## 2024-06-14 MED ORDER — ORAL CARE MOUTH RINSE: 15.0000 mL | Freq: Once | OROMUCOSAL | Status: AC

## 2024-06-14 MED ORDER — SUGAMMADEX SODIUM 200 MG/2ML IV SOLN
INTRAVENOUS | Status: DC | PRN
Start: 2024-06-14 — End: 2024-06-14
  Administered 2024-06-14 (×2): 200 mg via INTRAVENOUS

## 2024-06-14 MED ORDER — CHLORHEXIDINE GLUCONATE 0.12 % MT SOLN: 15.0000 mL | Freq: Once | OROMUCOSAL | Status: AC

## 2024-06-14 MED ORDER — HEMOSTATIC AGENTS (NO CHARGE) OPTIME
TOPICAL | Status: DC | PRN
  Administered 2024-06-14: 1 via TOPICAL

## 2024-06-14 MED ORDER — ROCURONIUM BROMIDE 10 MG/ML (PF) SYRINGE
PREFILLED_SYRINGE | INTRAVENOUS | Status: DC | PRN
Start: 2024-06-14 — End: 2024-06-14
  Administered 2024-06-14: 20 mg via INTRAVENOUS
  Administered 2024-06-14: 70 mg via INTRAVENOUS
  Administered 2024-06-14: 20 mg via INTRAVENOUS

## 2024-06-14 MED ORDER — MUPIROCIN 2 % EX OINT
TOPICAL_OINTMENT | CUTANEOUS | Status: AC
  Administered 2024-06-14: 1 via TOPICAL
  Filled 2024-06-14: qty 22

## 2024-06-14 MED ORDER — SODIUM CHLORIDE 0.9 % IV SOLN
0.0125 ug/kg/min | INTRAVENOUS | Status: AC
  Administered 2024-06-14: .1 ug/kg/min via INTRAVENOUS
  Filled 2024-06-14: qty 2000

## 2024-06-14 MED ORDER — PROPOFOL 10 MG/ML IV BOLUS
INTRAVENOUS | Status: AC
  Filled 2024-06-14: qty 20

## 2024-06-14 MED ORDER — CEFAZOLIN SODIUM-DEXTROSE 2-4 GM/100ML-% IV SOLN
2.0000 g | INTRAVENOUS | Status: AC
  Administered 2024-06-14: 2 g via INTRAVENOUS
  Filled 2024-06-14: qty 100

## 2024-06-14 MED ORDER — HYDROMORPHONE HCL 1 MG/ML IJ SOLN
INTRAMUSCULAR | Status: AC
  Filled 2024-06-14: qty 0.5

## 2024-06-14 MED ORDER — LACTATED RINGERS IV SOLN: INTRAVENOUS | Status: DC | PRN

## 2024-06-14 MED ORDER — SUCCINYLCHOLINE CHLORIDE 200 MG/10ML IV SOSY
PREFILLED_SYRINGE | INTRAVENOUS | Status: AC
Start: 2024-06-14 — End: 2024-06-14
  Filled 2024-06-14: qty 10

## 2024-06-14 MED ORDER — DROPERIDOL 2.5 MG/ML IJ SOLN: 0.6250 mg | Freq: Once | INTRAMUSCULAR | Status: DC | PRN

## 2024-06-14 MED ORDER — ALBUTEROL SULFATE (2.5 MG/3ML) 0.083% IN NEBU
2.5000 mg | INHALATION_SOLUTION | RESPIRATORY_TRACT | Status: DC | PRN
Start: 2024-06-14 — End: 2024-06-17

## 2024-06-14 MED ORDER — SUCCINYLCHOLINE CHLORIDE 200 MG/10ML IV SOSY
PREFILLED_SYRINGE | INTRAVENOUS | Status: DC | PRN
  Administered 2024-06-14: 120 mg via INTRAVENOUS

## 2024-06-14 MED ORDER — HYDROMORPHONE HCL 1 MG/ML IJ SOLN
INTRAMUSCULAR | Status: DC | PRN
  Administered 2024-06-14: .5 mg via INTRAVENOUS

## 2024-06-14 MED ORDER — BUPIVACAINE-EPINEPHRINE (PF) 0.5% -1:200000 IJ SOLN
INTRAMUSCULAR | Status: AC
  Filled 2024-06-14: qty 30

## 2024-06-14 MED ORDER — THROMBIN 5000 UNITS EX KIT
PACK | CUTANEOUS | Status: AC
  Filled 2024-06-14: qty 1

## 2024-06-14 MED ORDER — ACETAMINOPHEN 10 MG/ML IV SOLN: 1000.0000 mg | Freq: Once | INTRAVENOUS | Status: DC | PRN

## 2024-06-14 MED ORDER — LIDOCAINE 2% (20 MG/ML) 5 ML SYRINGE
INTRAMUSCULAR | Status: DC | PRN
  Administered 2024-06-14: 100 mg via INTRAVENOUS

## 2024-06-14 MED ORDER — BACITRACIN ZINC 500 UNIT/GM EX OINT
TOPICAL_OINTMENT | CUTANEOUS | Status: DC | PRN
  Administered 2024-06-14: 1 via TOPICAL

## 2024-06-14 MED ORDER — FENTANYL CITRATE (PF) 100 MCG/2ML IJ SOLN
INTRAMUSCULAR | Status: AC
  Filled 2024-06-14: qty 2

## 2024-06-14 MED ORDER — IOHEXOL 350 MG/ML SOLN
75.0000 mL | Freq: Once | INTRAVENOUS | Status: AC | PRN
  Administered 2024-06-14: 75 mL via INTRAVENOUS

## 2024-06-14 MED ORDER — SURGIRINSE WOUND IRRIGATION SYSTEM - OPTIME
TOPICAL | Status: DC | PRN
  Administered 2024-06-14: 450 mL via TOPICAL

## 2024-06-14 MED ORDER — PROPOFOL 10 MG/ML IV BOLUS
INTRAVENOUS | Status: DC | PRN
  Administered 2024-06-14: 80 mg via INTRAVENOUS

## 2024-06-14 MED ORDER — ONDANSETRON HCL 4 MG/2ML IJ SOLN
INTRAMUSCULAR | Status: DC | PRN
  Administered 2024-06-14: 4 mg via INTRAVENOUS

## 2024-06-14 MED ORDER — LACTATED RINGERS IV SOLN: INTRAVENOUS | Status: DC

## 2024-06-14 MED ORDER — 0.9 % SODIUM CHLORIDE (POUR BTL) OPTIME
TOPICAL | Status: DC | PRN
  Administered 2024-06-14: 1000 mL

## 2024-06-14 MED ORDER — BUPIVACAINE-EPINEPHRINE 0.5% -1:200000 IJ SOLN
INTRAMUSCULAR | Status: DC | PRN
  Administered 2024-06-14: 10 mL

## 2024-06-14 MED ORDER — EPHEDRINE SULFATE-NACL 50-0.9 MG/10ML-% IV SOSY
PREFILLED_SYRINGE | INTRAVENOUS | Status: DC | PRN
Start: 2024-06-14 — End: 2024-06-14
  Administered 2024-06-14: 5 mg via INTRAVENOUS
  Administered 2024-06-14: 10 mg via INTRAVENOUS

## 2024-06-14 MED ORDER — ONDANSETRON HCL 4 MG/2ML IJ SOLN
INTRAMUSCULAR | Status: AC
  Filled 2024-06-14: qty 2

## 2024-06-14 MED ORDER — PROPOFOL 500 MG/50ML IV EMUL
INTRAVENOUS | Status: DC | PRN
  Administered 2024-06-14: 125 ug/kg/min via INTRAVENOUS

## 2024-06-14 MED ORDER — CHLORHEXIDINE GLUCONATE 0.12 % MT SOLN
OROMUCOSAL | Status: AC
  Administered 2024-06-14: 15 mL via OROMUCOSAL
  Filled 2024-06-14: qty 15

## 2024-06-14 MED ORDER — PHENYLEPHRINE HCL-NACL 20-0.9 MG/250ML-% IV SOLN
INTRAVENOUS | Status: DC | PRN
Start: 2024-06-14 — End: 2024-06-14
  Administered 2024-06-14: 55 ug/min via INTRAVENOUS

## 2024-06-14 MED ORDER — FENTANYL CITRATE (PF) 100 MCG/2ML IJ SOLN
INTRAMUSCULAR | Status: DC | PRN
  Administered 2024-06-14 (×2): 50 ug via INTRAVENOUS

## 2024-06-14 MED ORDER — MUPIROCIN 2 % EX OINT: 1.0000 | TOPICAL_OINTMENT | Freq: Once | CUTANEOUS | Status: AC

## 2024-06-14 MED ORDER — ROCURONIUM BROMIDE 10 MG/ML (PF) SYRINGE
PREFILLED_SYRINGE | INTRAVENOUS | Status: AC
  Filled 2024-06-14: qty 10

## 2024-06-14 MED ORDER — BACITRACIN ZINC 500 UNIT/GM EX OINT
TOPICAL_OINTMENT | CUTANEOUS | Status: AC
  Filled 2024-06-14: qty 28.35

## 2024-06-14 MED ORDER — THROMBIN 5000 UNITS EX SOLR
OROMUCOSAL | Status: DC | PRN
  Administered 2024-06-14 (×3): 5 mL via TOPICAL

## 2024-06-14 MED ORDER — FLUTICASONE FUROATE-VILANTEROL 100-25 MCG/ACT IN AEPB
1.0000 | INHALATION_SPRAY | Freq: Every day | RESPIRATORY_TRACT | Status: DC
  Administered 2024-06-15 – 2024-06-17 (×3): 1 via RESPIRATORY_TRACT
  Filled 2024-06-14 (×3): qty 28

## 2024-06-14 MED ORDER — LIDOCAINE 2% (20 MG/ML) 5 ML SYRINGE
INTRAMUSCULAR | Status: AC
  Filled 2024-06-14: qty 5

## 2024-06-14 SURGICAL SUPPLY — 54 items
ALLOGRAFT ARTHROCELL PLUS 5 (Bone Implant) IMPLANT
BAG COUNTER SPONGE SURGICOUNT (BAG) ×3 IMPLANT
BAND RUBBER #18 3X1/16 STRL (MISCELLANEOUS) ×3 IMPLANT
BIT DRILL RELINE C NAV.S (BIT) IMPLANT
BLADE CLIPPER SURG (BLADE) IMPLANT
BUR MATCHSTICK NEURO 3.0 LAGG (BURR) ×3 IMPLANT
CANISTER SUCTION 3000ML PPV (SUCTIONS) ×3 IMPLANT
DERMABOND ADVANCED .7 DNX12 (GAUZE/BANDAGES/DRESSINGS) IMPLANT
DRAPE C-ARM 42X72 X-RAY (DRAPES) ×6 IMPLANT
DRAPE SURG 17X23 STRL (DRAPES) ×3 IMPLANT
DRAPE UTILITY XL STRL (DRAPES) ×3 IMPLANT
DRSG OPSITE POSTOP 4X6 (GAUZE/BANDAGES/DRESSINGS) IMPLANT
DURAPREP 26ML APPLICATOR (WOUND CARE) ×6 IMPLANT
ELECTRODE BLADE INSULATED 4IN (ELECTROSURGICAL) ×3 IMPLANT
ELECTRODE REM PT RTRN 9FT ADLT (ELECTROSURGICAL) ×3 IMPLANT
FEE INTRAOP CADWELL SUPPLY NCS (MISCELLANEOUS) IMPLANT
FEE INTRAOP MONITOR IMPULS NCS (MISCELLANEOUS) IMPLANT
GAUZE 4X4 16PLY ~~LOC~~+RFID DBL (SPONGE) IMPLANT
GAUZE SPONGE 4X4 12PLY STRL (GAUZE/BANDAGES/DRESSINGS) IMPLANT
GLOVE INDICATOR 8.5 STRL (GLOVE) ×9 IMPLANT
GLOVE SURG SYN 8.0 PF PI (GLOVE) ×3 IMPLANT
GOWN STRL REUS W/ TWL LRG LVL3 (GOWN DISPOSABLE) IMPLANT
GOWN STRL REUS W/ TWL XL LVL3 (GOWN DISPOSABLE) ×3 IMPLANT
GOWN STRL REUS W/TWL 2XL LVL3 (GOWN DISPOSABLE) IMPLANT
HEMOSTAT POWDER KIT SURGIFOAM (HEMOSTASIS) ×3 IMPLANT
KIT BASIN OR (CUSTOM PROCEDURE TRAY) ×3 IMPLANT
KIT POSITIONER JACKSON TABLE (MISCELLANEOUS) ×3 IMPLANT
KIT TURNOVER KIT B (KITS) ×3 IMPLANT
MARKER SPHERE PSV REFLC NDI (MISCELLANEOUS) IMPLANT
NDL HYPO 18GX1.5 BLUNT FILL (NEEDLE) IMPLANT
NDL HYPO 22X1.5 SAFETY MO (MISCELLANEOUS) ×3 IMPLANT
NEEDLE HYPO 18GX1.5 BLUNT FILL (NEEDLE) IMPLANT
NEEDLE HYPO 22X1.5 SAFETY MO (MISCELLANEOUS) ×2 IMPLANT
PACK LAMINECTOMY NEURO (CUSTOM PROCEDURE TRAY) ×3 IMPLANT
PACK UNIVERSAL I (CUSTOM PROCEDURE TRAY) ×3 IMPLANT
PAD ARMBOARD POSITIONER FOAM (MISCELLANEOUS) ×9 IMPLANT
PIN MAYFIELD SKULL DISP (PIN) ×3 IMPLANT
ROD PB RELINE C 3.5X25 (Rod) IMPLANT
SCREW LOCK RELINE C OPEN (Screw) IMPLANT
SCREW SP FA RD RELINE-C 4X36 (Screw) IMPLANT
SCREW SP MA RELINE-C 4X26 (Screw) IMPLANT
SEALER BIPOLAR AQUA 6.0 (INSTRUMENTS) ×3 IMPLANT
SOLN 0.9% NACL POUR BTL 1000ML (IV SOLUTION) ×3 IMPLANT
SOLN STERILE WATER BTL 1000 ML (IV SOLUTION) ×3 IMPLANT
SOLUTION IRRIG SURGIPHOR (IV SOLUTION) IMPLANT
SPIKE FLUID TRANSFER (MISCELLANEOUS) ×3 IMPLANT
SPONGE T-LAP 4X18 ~~LOC~~+RFID (SPONGE) IMPLANT
SUT MNCRL AB 4-0 PS2 18 (SUTURE) ×3 IMPLANT
SUT VIC AB 1 CT1 18XBRD ANBCTR (SUTURE) ×3 IMPLANT
SUT VIC AB 2-0 CT1 18 (SUTURE) ×3 IMPLANT
SUT VIC AB 3-0 SH 8-18 (SUTURE) ×3 IMPLANT
SYR 3ML LL SCALE MARK (SYRINGE) ×3 IMPLANT
TOWEL GREEN STERILE (TOWEL DISPOSABLE) ×3 IMPLANT
TOWEL GREEN STERILE FF (TOWEL DISPOSABLE) ×3 IMPLANT

## 2024-06-14 NOTE — Anesthesia Procedure Notes (Signed)
 Procedure Name: Intubation Date/Time: 06/14/2024 3:59 PM  Performed by: Mannie Krystal LABOR, CRNAPre-anesthesia Checklist: Patient identified, Emergency Drugs available, Suction available and Patient being monitored Patient Re-evaluated:Patient Re-evaluated prior to induction Oxygen  Delivery Method: Circle system utilized Preoxygenation: Pre-oxygenation with 100% oxygen  Induction Type: IV induction and Rapid sequence Laryngoscope Size: Glidescope and 4 Grade View: Grade I Tube type: Oral Tube size: 7.5 mm Number of attempts: 1 Airway Equipment and Method: Stylet Placement Confirmation: ETT inserted through vocal cords under direct vision, positive ETCO2 and breath sounds checked- equal and bilateral Secured at: 22 cm Tube secured with: Tape Dental Injury: Teeth and Oropharynx as per pre-operative assessment

## 2024-06-14 NOTE — Anesthesia Postprocedure Evaluation (Signed)
 Anesthesia Post Note  Patient: Adrian Ray  Procedure(s) Performed: POSTERIOR CERVICAL FUSION/FORAMINOTOMY CERVICAL ONE- TWO (Spine Cervical)     Patient location during evaluation: PACU Anesthesia Type: General Level of consciousness: awake and alert Pain management: pain level controlled Vital Signs Assessment: post-procedure vital signs reviewed and stable Respiratory status: spontaneous breathing, nonlabored ventilation, respiratory function stable and patient connected to nasal cannula oxygen  Cardiovascular status: blood pressure returned to baseline and stable Postop Assessment: no apparent nausea or vomiting Anesthetic complications: no   No notable events documented.  Last Vitals:  Vitals:   06/14/24 2010 06/14/24 2029  BP: (!) 152/65 (!) 149/82  Pulse: 65 62  Resp: 15 18  Temp:  36.4 C  SpO2: 97% 96%    Last Pain:  Vitals:   06/14/24 2029  TempSrc: Oral  PainSc:                  Thom JONELLE Peoples

## 2024-06-14 NOTE — Progress Notes (Signed)
 Assessment 89 year old male with high functioning recent diagnosis of dementia who presents after ground-level fall and imaging showing severely displaced odontoid fracture without neurologic injury   LOS: 1 day    Plan: OR today   Subjective: Neurologically stable  Objective: Vital signs in last 24 hours: Temp:  [97.4 F (36.3 C)-98.3 F (36.8 C)] 98.3 F (36.8 C) (11/19 0715) Pulse Rate:  [66-83] 74 (11/19 0715) Resp:  [15-26] 17 (11/19 0715) BP: (132-168)/(62-88) 147/78 (11/19 0715) SpO2:  [95 %-99 %] 99 % (11/19 0715)  Intake/Output from previous day: No intake/output data recorded. Intake/Output this shift: No intake/output data recorded.  Exam: GCS 4E 4V 43M  Requires pen/paper to communicate. Can't hear anything Slightly confused. Can convey symptoms and have a conversation No aphasia or dysarthria PERRL EOMI, conjugate gaze FS TM Full strength BUE and BLE  Full sensation  Lab Results: Recent Labs    06/13/24 1848 06/14/24 0404  WBC 11.5* 14.8*  HGB 13.2 13.2  HCT 39.7 39.0  PLT 235 236   BMET Recent Labs    06/13/24 1848 06/14/24 0404  NA 133* 132*  K 3.7 4.5  CL 100 98  CO2 23 23  GLUCOSE 128* 131*  BUN 10 10  CREATININE 0.79 0.89  CALCIUM 7.9* 8.4*    Studies/Results: CT ANGIO HEAD NECK W WO CM Result Date: 06/14/2024 EXAM: CTA HEAD AND NECK WITH AND WITHOUT 06/14/2024 01:13:06 AM TECHNIQUE: CTA of the head and neck was performed with and without the administration of intravenous contrast. Multiplanar 2D and/or 3D reformatted images are provided for review. Automated exposure control, iterative reconstruction, and/or weight based adjustment of the mA/kV was utilized to reduce the radiation dose to as low as reasonably achievable. Stenosis of the internal carotid arteries measured using NASCET criteria. COMPARISON: MRI cervical spine from earlier today. CT cervical spine from Jun 13, 2024 CLINICAL HISTORY: Neck trauma, arterial injury  suspected FINDINGS: Evaluation at the craniocervical junction is limited by streak artifact. Within this limitation: AORTIC ARCH AND ARCH VESSELS: No dissection or arterial injury. No significant stenosis of the brachiocephalic or subclavian arteries. CERVICAL CAROTID ARTERIES: No dissection, arterial injury, or hemodynamically significant stenosis by NASCET criteria. CERVICAL VERTEBRAL ARTERIES: No dissection, arterial injury, or significant stenosis. LUNGS AND MEDIASTINUM: Chronic bronchiectasis and nodularity in the lung apices, progressed since May 12, 2023 CT Chest. SOFT TISSUES: No acute abnormality. BONES: Known unstable C2 fracture characterized on MRI and CT cervical spine from today and Jun 13, 2024. ANTERIOR CIRCULATION: No significant stenosis of the internal carotid arteries. No significant stenosis of the anterior cerebral arteries. No significant stenosis of the middle cerebral arteries. No aneurysm. POSTERIOR CIRCULATION: No significant stenosis of the posterior cerebral arteries. Moderate basilar artery stenosis. No significant stenosis of the vertebral arteries. No aneurysm. OTHER: No dural venous sinus thrombosis on this non-dedicated study. IMPRESSION: 1. No specific evidence of arterial injury. 2. No large vessel occlusion. 3. Moderate basilar artery stenosis. 4. Chronic bronchiectasis and nodularity in the lung apices, progressed since May 12, 2023 CT Chest. Electronically signed by: Gilmore Molt MD 06/14/2024 01:26 AM EST RP Workstation: [id number]   MR CERVICAL SPINE WO CONTRAST Result Date: 06/14/2024 EXAM: MRI Cervical Spine Without Contrast 06/14/2024 12:19:55 AM TECHNIQUE: Multiplanar multisequence MRI of the cervical spine was performed. COMPARISON: CT cervical spine Jun 13, 2024 CLINICAL HISTORY: Neck trauma, ligament injury suspected (Age >= 16y) FINDINGS: BONES AND ALIGNMENT: The craniocervical junction is incompletely imaged. Acute dens fracture with approximately 1.3 cm of  anterior displacement. The dens tip is posteriorly displaced along with the occiput. The posterior longitudinal ligament is uplifted and thinned. Disruption of the anterior longitudinal ligament at the fracture site (see series 5 iamges 15/16). There is resulting moderate to severe canal stenosis. Fluid/hemorrhage within the fracture cleft. Adjacent prevertebral edema. Perching of the facets better characterized on the CT of the cervical spine. SPINAL CORD: No abnormal spinal cord signal. SOFT TISSUES: No paraspinal mass. C2-C3: Traumatic findings detailed above.  Moderate to severe canal stenosis. C3-C4: Right facet and uncovertebral hypertrophy. Severe right foraminal stenosis. Patent canal and left foramen. C4-C5: Bilateral facet and uncovertebral hypertrophy. Moderate left and mild right foraminal stenosis. C5-C6: Bilateral facet and uncovertebral hypertrophy. Moderate left and mild right foraminal stenosis. C6-C7: Left greater than right facet and uncovertebral hypertrophy. Severe left and mild right foraminal stenosis. C7-T1: Left greater than right facet and uncovertebral hypertrophy. Moderate left foraminal stenosis. IMPRESSION: 1. Unstable dens fracture with 1.3 cm anterior displacement, disrupted anterior longitudinal ligament and thinned/uplifted posterior longitudinal ligament. 2. Resulting moderate to severe  canal stenosis. Normal cord signal. 3. Perched facets better characterized on same day CT of the cervical spine. Electronically signed by: Gilmore Molt MD 06/14/2024 12:58 AM EST RP Workstation: [id number]   CT Head Wo Contrast Result Date: 06/13/2024 EXAM: CT HEAD, FACIAL BONES AND CERVICAL SPINE WITHOUT CONTRAST 06/13/2024 07:30:00 PM TECHNIQUE: CT of the head, facial bones and cervical spine was performed without the administration of intravenous contrast. Multiplanar reformatted images are provided for review. Automated exposure control, iterative reconstruction, and/or weight based  adjustment of the mA/kV was utilized to reduce the radiation dose to as low as reasonably achievable. COMPARISON: Ct cspine 10/02/20 CT chest 05/12/23 CLINICAL HISTORY: Unwitnessed fall, head injury. FINDINGS: CT HEAD BRAIN AND VENTRICLES: Atherosclerotic calcifications are present within the cavernous internal carotid and vertebral arteries. No acute intracranial hemorrhage. No mass effect or midline shift. No extra-axial fluid collection. No evidence of acute infarct. No hydrocephalus. SKULL AND SCALP: No acute skull fracture. No scalp hematoma. CT FACIAL BONES FACIAL BONES: Acute bilateral nasal bone fractures. No mandibular dislocation. No suspicious bone lesion. ORBITS: No acute traumatic injury. SINUSES AND MASTOIDS: Polypoid-like left maxillary sinus mucosal thickening. The right maxillary sinus mucosal thickening. Right frontal sinus mucosal thickening. SOFT TISSUES: No acute abnormality. CT CERVICAL SPINE BONES AND ALIGNMENT: 1.3 cm anterior displacement of an acute displaced C2 dens fracture with associated perching of the C1 and C2 facets. Associated central canal stenosis at the C1-C2 level. Multilevel moderate degenerative changes of the spine. Associated right severe osseous neural foraminal stenosis at the C3-C4 level. Partial osseous fusion of the C2-C3 levels. DEGENERATIVE CHANGES: No significant degenerative changes. SOFT TISSUES: No prevertebral soft tissue swelling. Cavitary lesions versus bronchiectasis within the right upper lobe with associated nodular opacities As an example, a 1.5x1 cm cavitary lesion (4.99). IMPRESSION: 1. 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 Emergent neurosurgery consultation recommended. 2. Acute bilateral nasal bone fractures. 3. No acute intracranial abnormality . 4. Cavitary lesions versus bronchiectasis within the right upper lobe. Findings  worsened from CT chest 05/12/23 within the right upper lobe. These details were discussed with Dr. darra by Dr. Margarite over the phone on 06/13/2024 at 7:51 pm Electronically signed by: Morgane Naveau MD 06/13/2024 07:59 PM EST RP Workstation: [id number]   CT Cervical Spine Wo Contrast Result Date: 06/13/2024 EXAM: CT  HEAD, FACIAL BONES AND CERVICAL SPINE WITHOUT CONTRAST 06/13/2024 07:30:00 PM TECHNIQUE: CT of the head, facial bones and cervical spine was performed without the administration of intravenous contrast. Multiplanar reformatted images are provided for review. Automated exposure control, iterative reconstruction, and/or weight based adjustment of the mA/kV was utilized to reduce the radiation dose to as low as reasonably achievable. COMPARISON: Ct cspine 10/02/20 CT chest 05/12/23 CLINICAL HISTORY: Unwitnessed fall, head injury. FINDINGS: CT HEAD BRAIN AND VENTRICLES: Atherosclerotic calcifications are present within the cavernous internal carotid and vertebral arteries. No acute intracranial hemorrhage. No mass effect or midline shift. No extra-axial fluid collection. No evidence of acute infarct. No hydrocephalus. SKULL AND SCALP: No acute skull fracture. No scalp hematoma. CT FACIAL BONES FACIAL BONES: Acute bilateral nasal bone fractures. No mandibular dislocation. No suspicious bone lesion. ORBITS: No acute traumatic injury. SINUSES AND MASTOIDS: Polypoid-like left maxillary sinus mucosal thickening. The right maxillary sinus mucosal thickening. Right frontal sinus mucosal thickening. SOFT TISSUES: No acute abnormality. CT CERVICAL SPINE BONES AND ALIGNMENT: 1.3 cm anterior displacement of an acute displaced C2 dens fracture with associated perching of the C1 and C2 facets. Associated central canal stenosis at the C1-C2 level. Multilevel moderate degenerative changes of the spine. Associated right severe osseous neural foraminal stenosis at the C3-C4 level. Partial osseous fusion of the C2-C3  levels. DEGENERATIVE CHANGES: No significant degenerative changes. SOFT TISSUES: No prevertebral soft tissue swelling. Cavitary lesions versus bronchiectasis within the right upper lobe with associated nodular opacities As an example, a 1.5x1 cm cavitary lesion (4.99). IMPRESSION: 1. 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 Emergent neurosurgery consultation recommended. 2. Acute bilateral nasal bone fractures. 3. No acute intracranial abnormality . 4. Cavitary lesions versus bronchiectasis within the right upper lobe. Findings worsened from CT chest 05/12/23 within the right upper lobe. These details were discussed with Dr. darra by Dr. Margarite over the phone on 06/13/2024 at 7:51 pm Electronically signed by: Morgane Naveau MD 06/13/2024 07:59 PM EST RP Workstation: [id number]   CT Maxillofacial Wo Contrast Result Date: 06/13/2024 EXAM: CT HEAD, FACIAL BONES AND CERVICAL SPINE WITHOUT CONTRAST 06/13/2024 07:30:00 PM TECHNIQUE: CT of the head, facial bones and cervical spine was performed without the administration of intravenous contrast. Multiplanar reformatted images are provided for review. Automated exposure control, iterative reconstruction, and/or weight based adjustment of the mA/kV was utilized to reduce the radiation dose to as low as reasonably achievable. COMPARISON: Ct cspine 10/02/20 CT chest 05/12/23 CLINICAL HISTORY: Unwitnessed fall, head injury. FINDINGS: CT HEAD BRAIN AND VENTRICLES: Atherosclerotic calcifications are present within the cavernous internal carotid and vertebral arteries. No acute intracranial hemorrhage. No mass effect or midline shift. No extra-axial fluid collection. No evidence of acute infarct. No hydrocephalus. SKULL AND SCALP: No acute skull fracture. No scalp hematoma. CT FACIAL BONES FACIAL BONES: Acute bilateral nasal bone fractures. No mandibular  dislocation. No suspicious bone lesion. ORBITS: No acute traumatic injury. SINUSES AND MASTOIDS: Polypoid-like left maxillary sinus mucosal thickening. The right maxillary sinus mucosal thickening. Right frontal sinus mucosal thickening. SOFT TISSUES: No acute abnormality. CT CERVICAL SPINE BONES AND ALIGNMENT: 1.3 cm anterior displacement of an acute displaced C2 dens fracture with associated perching of the C1 and C2 facets. Associated central canal stenosis at the C1-C2 level. Multilevel moderate degenerative changes of the spine. Associated right severe osseous neural foraminal stenosis at the C3-C4 level. Partial osseous fusion  of the C2-C3 levels. DEGENERATIVE CHANGES: No significant degenerative changes. SOFT TISSUES: No prevertebral soft tissue swelling. Cavitary lesions versus bronchiectasis within the right upper lobe with associated nodular opacities As an example, a 1.5x1 cm cavitary lesion (4.99). IMPRESSION: 1. 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 Emergent neurosurgery consultation recommended. 2. Acute bilateral nasal bone fractures. 3. No acute intracranial abnormality . 4. Cavitary lesions versus bronchiectasis within the right upper lobe. Findings worsened from CT chest 05/12/23 within the right upper lobe. These details were discussed with Dr. darra by Dr. Margarite over the phone on 06/13/2024 at 7:51 pm Electronically signed by: Morgane Naveau MD 06/13/2024 07:59 PM EST RP Workstation: [id number]      Adrian Ray Glade 06/14/2024, 7:39 AM

## 2024-06-14 NOTE — Progress Notes (Signed)
 [facility]  PROGRESS NOTE  Adrian Ray FMW:[id number] DOB: 16-Oct-1933 DOA: 06/13/2024 PCP: Ransom Other, MD   Brief HPI:    89 y.o. male with medical history significant for sensorineural hearing loss, hyperlipidemia, COPD, who is admitted to [facility] on 06/13/2024 with unstable acute displaced C2 dens fracture after presenting from assisted living facility to [facility] ED complaining of fall.  The patient was ambulating on the sidewalk at his assisted living facility, where he is reported to live independently, when he reports that he tripped resulting in a fall straight forward, with the anterior aspect of his face serving as the principal point of contact with a cement.  He denies any associated loss of consciousness    Assessment/Plan:   Unstable acute displaced C2 dens fracture - S/p mechanical fall, no loss of consciousness -CT head showed no acute intracranial process - Neurosurgery consulted - Plan for surgery today - Preop evaluation done by admitting provider, Charlanne 0.5%, perioperative risk for significant cardiac event  Leukocytosis - WBC count 14,000 on admission - UA is clear - Has been afebrile, normal vital signs - Will continue to monitor  BPH - Tamsulosin and Proscar on hold  Interstitial lung disease - Stable, not requiring oxygen  - Continue Breo elliptica as needed  Hyperlipidemia - Continue to hold statin  Hyponatremia - Chronic, sodium is 132 - Follow serum sodium in a.m.  History of dementia - Aricept on hold - Delirium precautions    DVT prophylaxis: SCDs  Medications     sodium chloride    Intravenous Once   fluticasone  furoate-vilanterol  1 puff Inhalation Daily     Data Reviewed:   CBG:  No results for input(s): GLUCAP in the last 168 hours.  SpO2: 99 %    Vitals:   06/14/24 0500 06/14/24 0530 06/14/24 0600 06/14/24 0715  BP: (!) 154/62 134/73 (!) 152/65 (!) 147/78  Pulse: 70 81 83 74  Resp: 16 16 18 17   Temp:     98.3 F (36.8 C)  TempSrc:    Oral  SpO2: 97% 98% 98% 99%      Data Reviewed:  Basic Metabolic Panel: Recent Labs  Lab 06/13/24 1848 06/14/24 0404  NA 133* 132*  K 3.7 4.5  CL 100 98  CO2 23 23  GLUCOSE 128* 131*  BUN 10 10  CREATININE 0.79 0.89  CALCIUM 7.9* 8.4*  MG  --  2.1    CBC: Recent Labs  Lab 06/13/24 1848 06/14/24 0404  WBC 11.5* 14.8*  NEUTROABS 8.9* 12.3*  HGB 13.2 13.2  HCT 39.7 39.0  MCV 96.6 96.3  PLT 235 236    LFT Recent Labs  Lab 06/13/24 1848 06/14/24 0404  AST 22 20  ALT 12 12  ALKPHOS 94 97  BILITOT 0.6 0.7  PROT 6.2* 6.8  ALBUMIN 3.1* 3.2*     Antibiotics: Anti-infectives (From admission, onward)    Start     Dose/Rate Route Frequency Ordered Stop   06/14/24 0703  ceFAZolin (ANCEF) IVPB 2g/100 mL premix        2 g 200 mL/hr over 30 Minutes Intravenous 30 min pre-op 06/14/24 0703          CONSULTS neurosurgery  Code Status: Full code  Family Communication: Discussed with patient's daughter on phone     Subjective   complains of neck pain.  Plan for surgery today.   Objective    Physical Examination:   Appears in no acute distress Neck-cervical collar in  place S1-S2, regular, no murmur auscultated Lungs are clear to auscultation bilaterally Abdomen is soft, nontender Extremities no edema   Status is: Inpatient:             Adrian Ray   [facility] If 7PM-7AM, please contact night-coverage at [email or web address], Office  [phone]   06/14/2024, 8:24 AM  LOS: 1 day

## 2024-06-14 NOTE — ED Notes (Signed)
 Chaplain stopped by to see patient this morning per the daughter's request.

## 2024-06-14 NOTE — Anesthesia Procedure Notes (Signed)
 Arterial Line Insertion Start/End11/19/2025 3:30 PM, 06/14/2024 3:40 PM Performed by: Boyce Shilling, CRNA, CRNA  Patient location: Pre-op. Preanesthetic checklist: patient identified, IV checked, site marked, risks and benefits discussed, surgical consent, monitors and equipment checked, pre-op evaluation, timeout performed and anesthesia consent Lidocaine  1% used for infiltration Left, radial was placed Catheter size: 20 G Hand hygiene performed  and maximum sterile barriers used  Allen's test indicative of satisfactory collateral circulation Attempts: 1 Following insertion, dressing applied and Biopatch. Post procedure assessment: normal and unchanged  Patient tolerated the procedure well with no immediate complications.

## 2024-06-14 NOTE — Op Note (Signed)
 PREOP DIAGNOSIS:  Unstable odontoid fracture  POSTOP DIAGNOSIS: Same  PROCEDURE: Posterior C1-C2 segmental instrumentation and fusion  Reduction of C2 fracture  Use of neuronavigation Use of neuromonitoring  SURGEON: Dorn Glade, MD  ASSISTANT: None  ANESTHESIA: General Endotracheal  EBL: 200 cc  SPECIMENS: None  DRAINS: None  COMPLICATIONS: None  NEUROMONITORING: Stable throughout  CONDITION: Stable  HISTORY: Adrian Ray is a 89 y.o. male with history of relatively high functioning dementia who presented after a ground-level fall and was found to have a unstable displaced type II odontoid fracture.  I offered a posterior C1 to reduction of fracture and fusion.  Explained risk of bleeding, infection, nerve root injury, spinal cord injury, CSF leak, general anesthesia risk, mortality, hardware failure to his daughters.  They verbalized understanding and wanted to proceed  PROCEDURE IN DETAIL: The patient was brought to the operating room. After induction of general anesthesia, the patient was positioned on the operative table in the prone position with head in the [place] head holder. All pressure points were meticulously padded.  We used lateral fluoroscopy and manipulation of the [place] head holder to ensure that the fracture fragment was reduced.  Skin incision was then marked out using lateral fluoroscopy and prepped and draped in the usual sterile fashion.  We made a midline incision from approximately the level of foramen magnum to the level of the C3 spinous process.  We performed a midline subperiosteal dissection taking care to traverse the midline raphe to avoid paraspinal muscle damage.  We identified the C2 spinous process via palpation.  We then performed a subperiosteal dissection of the C2 spinous process, lamina, and pars.  We then performed a subperiosteal dissection of the C1 posterior arch.  We used blunt dissector's to identify the bilateral C2 pedicles.   We then used cautery and sharp dissection to coagulate the epidural venous plexus within the C2 neural foramen and transect the C2 nerve.  The level of transection was performed over the C1-2 facet joint.  After the nerve was transected, this gave us  access to the C1-2 facet joint.  We then used cautery and blunt dissection to expose the C1 lateral mass.  This was performed bilaterally.  We then used a high-speed bur to decorticate the bilateral C1-2 facet joints.  The facet joints were then stuffed with Arthrex arthrocell for fusion across the facet joints.  A navigation frame was attached to a cranial head holder which then attached to the Mayfield.  We then performed an intraoperative O-arm spin.  First we confirmed that the odontoid fracture fragment was reduced anteriorly from positioning.  We used a navigated high-speed bur to create pilot holes at the screw entry points for bilateral C2 and bilateral C1.  The C2 screws were planned in a 'parsicle'  trajectory.  The C1 screws were planned in a traditional lateral mass trajectory.  We then used a high-speed navigated drill to create all 4 trajectories.  We then used a navigated tap to tap the trajectories.  All trajectories palpated appropriately with a ball-tipped probe. We then placed screws using a navigated screwdriver.  The bilateral C2 screws were 4.0 x 26 mm.  The bilateral C1 screws were 4.0 x 36 mm with 22 mm within the C1 lateral mass. Fluoroscopy was used to confirm proper placement of all screws.  We then placed bilateral 3.5 mm titanium rods and set screws.  The setscrews were final tightened.  The wound was copiously irrigated.  We used a  high-speed bur to decorticate the posterior C1 arch and the C2 lamina and spinous process.  We then laid down Arthrex arthrocell for posterior C1-2 fusion.  The muscular was closed with 0 Vicryl.  The fascia was closed with 1 Vicryl.  The subcutaneous tissue was closed with 2-0 and 3-0 Vicryl.  The skin was  closed with 4-0 Monocryl and glue.  At the end of the case all sponge, needle, and instrument counts were correct. The patient was then transferred to the stretcher, extubated, and taken to the post-anesthesia care unit in stable hemodynamic condition.

## 2024-06-14 NOTE — ED Notes (Signed)
 Spoke with Geni (daughter) on the phone. Active FYI added to chart (clinically deaf) and consulted chaplin per family request. Daughter suggested whiteboards be used for communication.

## 2024-06-14 NOTE — Progress Notes (Signed)
 Chaplain asked by pt 's nurse to visit with him as he will be going to surgery and would like to see a Chaplain.  Chaplain engaged in ministry of presence and concern for pt.  Pt had difficulty hearing even though he had his hearing aids in.  Chaplain offered to pray with pt but was told pt. is [employer] and he prays silently alone.     [place] Chaplain

## 2024-06-14 NOTE — ED Notes (Signed)
 Pt currently has [place] c-collar in place

## 2024-06-14 NOTE — Progress Notes (Signed)
 Assessment 89 year old male with high functioning recent diagnosis of dementia who presents after ground-level fall and imaging showing severely displaced odontoid fracture without neurologic injury. Underwent posterior C1-2 fusion on 11/19  LOS: 1 day    Plan: Postop films AAT, no collar needed DAT Ok for DVT ppx on 11/21 Will remove dressing on 11/21  Subjective: Pt awoke slowly  Objective: Vital signs in last 24 hours: Temp:  [97.4 F (36.3 C)-98.6 F (37 C)] (P) 98.6 F (37 C) (11/19 1359) Pulse Rate:  [66-83] (P) 70 (11/19 1359) Resp:  [14-26] (P) 18 (11/19 1359) BP: (132-159)/(62-88) (P) 150/86 (11/19 1359) SpO2:  [95 %-100 %] (P) 100 % (11/19 1359) Weight:  [81.6 kg] (P) 81.6 kg (11/19 1359)  Intake/Output from previous day: No intake/output data recorded. Intake/Output this shift: Total I/O In: -  Out: 250 [Blood:250]  Exam: Still emerging Moves all 4 extremities spontaneously antigravity Posterior incision covered with honeycomb  Lab Results: Recent Labs    06/13/24 1848 06/14/24 0404  WBC 11.5* 14.8*  HGB 13.2 13.2  HCT 39.7 39.0  PLT 235 236   BMET Recent Labs    06/13/24 1848 06/14/24 0404  NA 133* 132*  K 3.7 4.5  CL 100 98  CO2 23 23  GLUCOSE 128* 131*  BUN 10 10  CREATININE 0.79 0.89  CALCIUM 7.9* 8.4*    Studies/Results: DG C-Arm 1-60 Min-No Report Result Date: 06/14/2024 Fluoroscopy was utilized by the requesting physician.  No radiographic interpretation.   DG C-Arm 1-60 Min-No Report Result Date: 06/14/2024 Fluoroscopy was utilized by the requesting physician.  No radiographic interpretation.   DG C-Arm 1-60 Min-No Report Result Date: 06/14/2024 Fluoroscopy was utilized by the requesting physician.  No radiographic interpretation.   DG O-ARM IMAGE ONLY/NO REPORT Result Date: 06/14/2024 There is no Radiologist interpretation  for this exam.  CT ANGIO HEAD NECK W WO CM Result Date: 06/14/2024 EXAM: CTA HEAD AND NECK  WITH AND WITHOUT 06/14/2024 01:13:06 AM TECHNIQUE: CTA of the head and neck was performed with and without the administration of intravenous contrast. Multiplanar 2D and/or 3D reformatted images are provided for review. Automated exposure control, iterative reconstruction, and/or weight based adjustment of the mA/kV was utilized to reduce the radiation dose to as low as reasonably achievable. Stenosis of the internal carotid arteries measured using NASCET criteria. COMPARISON: MRI cervical spine from earlier today. CT cervical spine from Jun 13, 2024 CLINICAL HISTORY: Neck trauma, arterial injury suspected FINDINGS: Evaluation at the craniocervical junction is limited by streak artifact. Within this limitation: AORTIC ARCH AND ARCH VESSELS: No dissection or arterial injury. No significant stenosis of the brachiocephalic or subclavian arteries. CERVICAL CAROTID ARTERIES: No dissection, arterial injury, or hemodynamically significant stenosis by NASCET criteria. CERVICAL VERTEBRAL ARTERIES: No dissection, arterial injury, or significant stenosis. LUNGS AND MEDIASTINUM: Chronic bronchiectasis and nodularity in the lung apices, progressed since May 12, 2023 CT Chest. SOFT TISSUES: No acute abnormality. BONES: Known unstable C2 fracture characterized on MRI and CT cervical spine from today and Jun 13, 2024. ANTERIOR CIRCULATION: No significant stenosis of the internal carotid arteries. No significant stenosis of the anterior cerebral arteries. No significant stenosis of the middle cerebral arteries. No aneurysm. POSTERIOR CIRCULATION: No significant stenosis of the posterior cerebral arteries. Moderate basilar artery stenosis. No significant stenosis of the vertebral arteries. No aneurysm. OTHER: No dural venous sinus thrombosis on this non-dedicated study. IMPRESSION: 1. No specific evidence of arterial injury. 2. No large vessel occlusion. 3. Moderate basilar artery stenosis.  4. Chronic bronchiectasis and nodularity in  the lung apices, progressed since May 12, 2023 CT Chest. Electronically signed by: Gilmore Molt MD 06/14/2024 01:26 AM EST RP Workstation: [id number]   MR CERVICAL SPINE WO CONTRAST Result Date: 06/14/2024 EXAM: MRI Cervical Spine Without Contrast 06/14/2024 12:19:55 AM TECHNIQUE: Multiplanar multisequence MRI of the cervical spine was performed. COMPARISON: CT cervical spine Jun 13, 2024 CLINICAL HISTORY: Neck trauma, ligament injury suspected (Age >= 16y) FINDINGS: BONES AND ALIGNMENT: The craniocervical junction is incompletely imaged. Acute dens fracture with approximately 1.3 cm of anterior displacement. The dens tip is posteriorly displaced along with the occiput. The posterior longitudinal ligament is uplifted and thinned. Disruption of the anterior longitudinal ligament at the fracture site (see series 5 iamges 15/16). There is resulting moderate to severe canal stenosis. Fluid/hemorrhage within the fracture cleft. Adjacent prevertebral edema. Perching of the facets better characterized on the CT of the cervical spine. SPINAL CORD: No abnormal spinal cord signal. SOFT TISSUES: No paraspinal mass. C2-C3: Traumatic findings detailed above.  Moderate to severe canal stenosis. C3-C4: Right facet and uncovertebral hypertrophy. Severe right foraminal stenosis. Patent canal and left foramen. C4-C5: Bilateral facet and uncovertebral hypertrophy. Moderate left and mild right foraminal stenosis. C5-C6: Bilateral facet and uncovertebral hypertrophy. Moderate left and mild right foraminal stenosis. C6-C7: Left greater than right facet and uncovertebral hypertrophy. Severe left and mild right foraminal stenosis. C7-T1: Left greater than right facet and uncovertebral hypertrophy. Moderate left foraminal stenosis. IMPRESSION: 1. Unstable dens fracture with 1.3 cm anterior displacement, disrupted anterior longitudinal ligament and thinned/uplifted posterior longitudinal ligament. 2. Resulting moderate to severe   canal stenosis. Normal cord signal. 3. Perched facets better characterized on same day CT of the cervical spine. Electronically signed by: Gilmore Molt MD 06/14/2024 12:58 AM EST RP Workstation: [id number]   CT Head Wo Contrast Result Date: 06/13/2024 EXAM: CT HEAD, FACIAL BONES AND CERVICAL SPINE WITHOUT CONTRAST 06/13/2024 07:30:00 PM TECHNIQUE: CT of the head, facial bones and cervical spine was performed without the administration of intravenous contrast. Multiplanar reformatted images are provided for review. Automated exposure control, iterative reconstruction, and/or weight based adjustment of the mA/kV was utilized to reduce the radiation dose to as low as reasonably achievable. COMPARISON: Ct cspine 10/02/20 CT chest 05/12/23 CLINICAL HISTORY: Unwitnessed fall, head injury. FINDINGS: CT HEAD BRAIN AND VENTRICLES: Atherosclerotic calcifications are present within the cavernous internal carotid and vertebral arteries. No acute intracranial hemorrhage. No mass effect or midline shift. No extra-axial fluid collection. No evidence of acute infarct. No hydrocephalus. SKULL AND SCALP: No acute skull fracture. No scalp hematoma. CT FACIAL BONES FACIAL BONES: Acute bilateral nasal bone fractures. No mandibular dislocation. No suspicious bone lesion. ORBITS: No acute traumatic injury. SINUSES AND MASTOIDS: Polypoid-like left maxillary sinus mucosal thickening. The right maxillary sinus mucosal thickening. Right frontal sinus mucosal thickening. SOFT TISSUES: No acute abnormality. CT CERVICAL SPINE BONES AND ALIGNMENT: 1.3 cm anterior displacement of an acute displaced C2 dens fracture with associated perching of the C1 and C2 facets. Associated central canal stenosis at the C1-C2 level. Multilevel moderate degenerative changes of the spine. Associated right severe osseous neural foraminal stenosis at the C3-C4 level. Partial osseous fusion of the C2-C3 levels. DEGENERATIVE CHANGES: No significant degenerative  changes. SOFT TISSUES: No prevertebral soft tissue swelling. Cavitary lesions versus bronchiectasis within the right upper lobe with associated nodular opacities As an example, a 1.5x1 cm cavitary lesion (4.99). IMPRESSION: 1. Unstable acute displaced C2 dens fracture with 1.3 cm anterior displacement and perching of  the C1 and C2 facets resulting in central canal stenosis at the C1-C2 level. Emergent neurosurgery consultation recommended. 2. Acute bilateral nasal bone fractures. 3. No acute intracranial abnormality . 4. Cavitary lesions versus bronchiectasis within the right upper lobe. Findings worsened from CT chest 05/12/23 within the right upper lobe. These details were discussed with Dr. darra by Dr. Margarite over the phone on 06/13/2024 at 7:51 pm Electronically signed by: Morgane Naveau MD 06/13/2024 07:59 PM EST RP Workstation: [id number]   CT Cervical Spine Wo Contrast Result Date: 06/13/2024 EXAM: CT HEAD, FACIAL BONES AND CERVICAL SPINE WITHOUT CONTRAST 06/13/2024 07:30:00 PM TECHNIQUE: CT of the head, facial bones and cervical spine was performed without the administration of intravenous contrast. Multiplanar reformatted images are provided for review. Automated exposure control, iterative reconstruction, and/or weight based adjustment of the mA/kV was utilized to reduce the radiation dose to as low as reasonably achievable. COMPARISON: Ct cspine 10/02/20 CT chest 05/12/23 CLINICAL HISTORY: Unwitnessed fall, head injury. FINDINGS: CT HEAD BRAIN AND VENTRICLES: Atherosclerotic calcifications are present within the cavernous internal carotid and vertebral arteries. No acute intracranial hemorrhage. No mass effect or midline shift. No extra-axial fluid collection. No evidence of acute infarct. No hydrocephalus. SKULL AND SCALP: No acute skull fracture. No scalp hematoma. CT FACIAL BONES FACIAL BONES: Acute bilateral nasal bone fractures. No mandibular dislocation. No suspicious bone lesion. ORBITS: No acute  traumatic injury. SINUSES AND MASTOIDS: Polypoid-like left maxillary sinus mucosal thickening. The right maxillary sinus mucosal thickening. Right frontal sinus mucosal thickening. SOFT TISSUES: No acute abnormality. CT CERVICAL SPINE BONES AND ALIGNMENT: 1.3 cm anterior displacement of an acute displaced C2 dens fracture with associated perching of the C1 and C2 facets. Associated central canal stenosis at the C1-C2 level. Multilevel moderate degenerative changes of the spine. Associated right severe osseous neural foraminal stenosis at the C3-C4 level. Partial osseous fusion of the C2-C3 levels. DEGENERATIVE CHANGES: No significant degenerative changes. SOFT TISSUES: No prevertebral soft tissue swelling. Cavitary lesions versus bronchiectasis within the right upper lobe with associated nodular opacities As an example, a 1.5x1 cm cavitary lesion (4.99). IMPRESSION: 1. 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 Emergent neurosurgery consultation recommended. 2. Acute bilateral nasal bone fractures. 3. No acute intracranial abnormality . 4. Cavitary lesions versus bronchiectasis within the right upper lobe. Findings worsened from CT chest 05/12/23 within the right upper lobe. These details were discussed with Dr. darra by Dr. Margarite over the phone on 06/13/2024 at 7:51 pm Electronically signed by: Morgane Naveau MD 06/13/2024 07:59 PM EST RP Workstation: [id number]   CT Maxillofacial Wo Contrast Result Date: 06/13/2024 EXAM: CT HEAD, FACIAL BONES AND CERVICAL SPINE WITHOUT CONTRAST 06/13/2024 07:30:00 PM TECHNIQUE: CT of the head, facial bones and cervical spine was performed without the administration of intravenous contrast. Multiplanar reformatted images are provided for review. Automated exposure control, iterative reconstruction, and/or weight based adjustment of the mA/kV was utilized  to reduce the radiation dose to as low as reasonably achievable. COMPARISON: Ct cspine 10/02/20 CT chest 05/12/23 CLINICAL HISTORY: Unwitnessed fall, head injury. FINDINGS: CT HEAD BRAIN AND VENTRICLES: Atherosclerotic calcifications are present within the cavernous internal carotid and vertebral arteries. No acute intracranial hemorrhage. No mass effect or midline shift. No extra-axial fluid collection. No evidence of acute infarct. No hydrocephalus. SKULL AND SCALP: No acute skull fracture. No scalp hematoma. CT FACIAL BONES FACIAL BONES: Acute bilateral nasal  bone fractures. No mandibular dislocation. No suspicious bone lesion. ORBITS: No acute traumatic injury. SINUSES AND MASTOIDS: Polypoid-like left maxillary sinus mucosal thickening. The right maxillary sinus mucosal thickening. Right frontal sinus mucosal thickening. SOFT TISSUES: No acute abnormality. CT CERVICAL SPINE BONES AND ALIGNMENT: 1.3 cm anterior displacement of an acute displaced C2 dens fracture with associated perching of the C1 and C2 facets. Associated central canal stenosis at the C1-C2 level. Multilevel moderate degenerative changes of the spine. Associated right severe osseous neural foraminal stenosis at the C3-C4 level. Partial osseous fusion of the C2-C3 levels. DEGENERATIVE CHANGES: No significant degenerative changes. SOFT TISSUES: No prevertebral soft tissue swelling. Cavitary lesions versus bronchiectasis within the right upper lobe with associated nodular opacities As an example, a 1.5x1 cm cavitary lesion (4.99). IMPRESSION: 1. 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 Emergent neurosurgery consultation recommended. 2. Acute bilateral nasal bone fractures. 3. No acute intracranial abnormality . 4. Cavitary lesions versus bronchiectasis within the right upper lobe. Findings worsened from CT chest 05/12/23  within the right upper lobe. These details were discussed with Dr. darra by Dr. Margarite over the phone on 06/13/2024 at 7:51 pm Electronically signed by: Morgane Naveau MD 06/13/2024 07:59 PM EST RP Workstation: [id number]      Adrian Ray 06/14/2024, 7:12 PM

## 2024-06-14 NOTE — Anesthesia Preprocedure Evaluation (Addendum)
 Anesthesia Evaluation  Patient identified by MRN, date of birth, ID band Patient awake    Reviewed: Allergy  & Precautions, NPO status , Patient's Chart, lab work & pertinent test results  Airway Mallampati: III   Neck ROM: Limited    Dental no notable dental hx. (+) Dental Advisory Given   Pulmonary asthma , COPD,  COPD inhaler   Pulmonary exam normal breath sounds clear to auscultation       Cardiovascular negative cardio ROS Normal cardiovascular exam Rhythm:Regular Rate:Normal  EKG 06/14/24 NSR, LAE, RAD   Neuro/Psych  PSYCHIATRIC DISORDERS       C-spine not cleared    GI/Hepatic Neg liver ROS,GERD  Medicated,,  Endo/Other  HLD  Renal/GU negative Renal ROSLab Results      Component                Value               Date                      NA                       132 (L)             06/14/2024                CL                       98                  06/14/2024                K                        4.5                 06/14/2024                CO2                      23                  06/14/2024                BUN                      10                  06/14/2024                CREATININE               0.89                06/14/2024                GFRNONAA                 >60                 06/14/2024                CALCIUM                  8.4 (L)             06/14/2024  ALBUMIN                  3.2 (L)             06/14/2024                GLUCOSE                  131 (H)             06/14/2024            Bladder dysfunction  BPH Frequency    Musculoskeletal  (+) Arthritis , Osteoarthritis,  C2 Fx unstable    Abdominal   Peds  Hematology negative hematology ROS (+) Lab Results      Component                Value               Date                      WBC                      14.8 (H)            06/14/2024                HGB                      13.2                06/14/2024                 HCT                      39.0                06/14/2024                MCV                      96.3                06/14/2024                PLT                      236                 06/14/2024              Anesthesia Other Findings   Reproductive/Obstetrics                              Anesthesia Physical Anesthesia Plan  ASA: 3  Anesthesia Plan: General   Post-op Pain Management: Tylenol  PO (pre-op)*   Induction: Intravenous  PONV Risk Score and Plan: 2 and Dexamethasone and Ondansetron  Airway Management Planned: Oral ETT and Video Laryngoscope Planned  Additional Equipment:   Intra-op Plan:   Post-operative Plan: Extubation in OR  Informed Consent: I have reviewed the patients History and Physical, chart, labs and discussed the procedure including the risks, benefits and alternatives for the proposed anesthesia with the patient or authorized representative who has indicated his/her understanding and acceptance.     Dental advisory given and Consent reviewed with POA  Plan Discussed with: CRNA  Anesthesia Plan Comments: (Phone consent with daughter.)         Anesthesia Quick Evaluation

## 2024-06-14 NOTE — Transfer of Care (Signed)
 Immediate Anesthesia Transfer of Care Note  Patient: Adrian Ray  Procedure(s) Performed: POSTERIOR CERVICAL FUSION/FORAMINOTOMY CERVICAL ONE- TWO (Spine Cervical)  Patient Location: PACU  Anesthesia Type:General  Level of Consciousness: drowsy  Airway & Oxygen  Therapy: Patient Spontanous Breathing and Patient connected to face mask oxygen   Post-op Assessment: Report given to RN and Post -op Vital signs reviewed and stable  Post vital signs: Reviewed and stable  Last Vitals:  Vitals Value Taken Time  BP 136/69 06/14/24 19:15  Temp    Pulse 82 06/14/24 19:24  Resp 17 06/14/24 19:24  SpO2 98 % 06/14/24 19:24  Vitals shown include unfiled device data.  Last Pain:  Vitals:   06/14/24 1359  TempSrc: (P) Oral  PainSc:          Complications: No notable events documented.

## 2024-06-15 ENCOUNTER — Inpatient Hospital Stay (HOSPITAL_COMMUNITY)

## 2024-06-15 DIAGNOSIS — Z981 Arthrodesis status: Secondary | ICD-10-CM | POA: Diagnosis not present

## 2024-06-15 DIAGNOSIS — M532X1 Spinal instabilities, occipito-atlanto-axial region: Secondary | ICD-10-CM | POA: Diagnosis not present

## 2024-06-15 DIAGNOSIS — S12101A Unspecified nondisplaced fracture of second cervical vertebra, initial encounter for closed fracture: Secondary | ICD-10-CM | POA: Diagnosis not present

## 2024-06-15 DIAGNOSIS — N400 Enlarged prostate without lower urinary tract symptoms: Secondary | ICD-10-CM | POA: Diagnosis not present

## 2024-06-15 DIAGNOSIS — Z9889 Other specified postprocedural states: Secondary | ICD-10-CM | POA: Diagnosis not present

## 2024-06-15 DIAGNOSIS — M47812 Spondylosis without myelopathy or radiculopathy, cervical region: Secondary | ICD-10-CM | POA: Diagnosis not present

## 2024-06-15 DIAGNOSIS — S12120A Other displaced dens fracture, initial encounter for closed fracture: Secondary | ICD-10-CM | POA: Diagnosis not present

## 2024-06-15 DIAGNOSIS — S12100A Unspecified displaced fracture of second cervical vertebra, initial encounter for closed fracture: Secondary | ICD-10-CM | POA: Diagnosis not present

## 2024-06-15 LAB — COMPREHENSIVE METABOLIC PANEL WITH GFR
ALT: 12 U/L (ref 0–44)
AST: 27 U/L (ref 15–41)
Albumin: 2.9 g/dL — ABNORMAL LOW (ref 3.5–5.0)
Alkaline Phosphatase: 95 U/L (ref 38–126)
Anion gap: 12 (ref 5–15)
BUN: 8 mg/dL (ref 8–23)
CO2: 22 mmol/L (ref 22–32)
Calcium: 8.3 mg/dL — ABNORMAL LOW (ref 8.9–10.3)
Chloride: 98 mmol/L (ref 98–111)
Creatinine, Ser: 0.84 mg/dL (ref 0.61–1.24)
GFR, Estimated: >60 mL/min (ref >60)
Glucose, Bld: 123 mg/dL — ABNORMAL HIGH (ref 70–99)
Potassium: 4.3 mmol/L (ref 3.5–5.1)
Sodium: 132 mmol/L — ABNORMAL LOW (ref 135–145)
Total Bilirubin: 1.3 mg/dL — ABNORMAL HIGH (ref 0.0–1.2)
Total Protein: 6.1 g/dL — ABNORMAL LOW (ref 6.5–8.1)

## 2024-06-15 LAB — CBC
HCT: 39.1 % (ref 39.0–52.0)
Hemoglobin: 13.5 g/dL (ref 13.0–17.0)
MCH: 32.6 pg (ref 26.0–34.0)
MCHC: 34.5 g/dL (ref 30.0–36.0)
MCV: 94.4 fL (ref 80.0–100.0)
Platelets: 203 K/uL (ref 150–400)
RBC: 4.14 MIL/uL — ABNORMAL LOW (ref 4.22–5.81)
RDW: 13.2 % (ref 11.5–15.5)
WBC: 17.6 K/uL — ABNORMAL HIGH (ref 4.0–10.5)
nRBC: 0 % (ref 0.0–0.2)

## 2024-06-15 LAB — GLUCOSE, CAPILLARY: Glucose-Capillary: 129 mg/dL — ABNORMAL HIGH (ref 70–99)

## 2024-06-15 MED ORDER — TRIPLE ANTIBIOTIC 3.5-400-5000 EX OINT
1.0000 | TOPICAL_OINTMENT | Freq: Two times a day (BID) | CUTANEOUS | Status: DC
  Filled 2024-06-15: qty 1

## 2024-06-15 MED ORDER — CYCLOBENZAPRINE HCL 5 MG PO TABS: 5.0000 mg | ORAL_TABLET | Freq: Three times a day (TID) | ORAL | Status: DC | PRN

## 2024-06-15 MED ORDER — OXYCODONE HCL 5 MG PO TABS: 5.0000 mg | ORAL_TABLET | ORAL | Status: DC | PRN

## 2024-06-15 MED ORDER — FINASTERIDE 5 MG PO TABS
5.0000 mg | ORAL_TABLET | Freq: Every day | ORAL | Status: DC
  Administered 2024-06-15 – 2024-06-17 (×3): 5 mg via ORAL
  Filled 2024-06-15 (×3): qty 1

## 2024-06-15 MED ORDER — FLUTICASONE PROPIONATE 50 MCG/ACT NA SUSP
2.0000 | Freq: Every day | NASAL | Status: DC
  Administered 2024-06-15 – 2024-06-17 (×3): 2 via NASAL
  Filled 2024-06-15: qty 16

## 2024-06-15 MED FILL — Thrombin For Soln 5000 Unit: CUTANEOUS | Qty: 5000 | Status: AC

## 2024-06-15 NOTE — Progress Notes (Signed)
   06/15/24 1000  Spiritual Encounters  Type of Visit Attempt (pt unavailable)   Chaplain responded to consult request but the pt was unavailable, he was resting. Chaplain will continue to follow up.   M.Kubra Susanna Kerry Resident [phone]

## 2024-06-15 NOTE — Discharge Instructions (Addendum)
 Wound Care You may take the dressing off of the back of your neck on 11/21. Afterwards, no dressing is required. Keep clean and dry. You can shower 24 hours after surgery. Let water run over incision. Do not scrub. Pat dry Do not put any creams, lotions, or ointments on incision. Do not submerge your incision for the first 6 weeks (pool, ocean, etc)  Activity Walk each and every day, increasing distance each day. No lifting greater than 10 lbs.  Avoid excessive back/neck motion.  Diet Resume your normal diet.  Call Your Doctor If Any of These Occur Redness, drainage, or swelling at the wound.  Temperature greater than 101 degrees. Severe pain not relieved by pain medication. Incision starts to come apart.   Follow Up Appt Call [phone] today for appointment in 6 weeks if you don't already have one or for any problems.  If you have any hardware placed in your spine, you will need an x-ray before your appointment.

## 2024-06-15 NOTE — Progress Notes (Signed)
 Assessment 89 year old male with high functioning recent diagnosis of dementia who presents after ground-level fall and imaging showing severely displaced odontoid fracture without neurologic injury. Underwent posterior C1-2 fusion on 11/19  LOS: 2 days    Plan: Postop films AAT, no collar needed DAT Ok for DVT ppx on 11/21 Will remove dressing on 11/21 Medically clear for discharge from nsgy standpoint  Subjective: No issues overnight. Patient did not remember he had surgery yesterday  Objective: Vital signs in last 24 hours: Temp:  [97 F (36.1 C)-98.6 F (37 C)] 98.1 F (36.7 C) (11/20 0340) Pulse Rate:  [62-85] 80 (11/20 0340) Resp:  [10-18] 17 (11/20 0340) BP: (122-152)/(62-82) 143/72 (11/20 0340) SpO2:  [96 %-100 %] 100 % (11/20 0340) Weight:  [81.6 kg] (P) 81.6 kg (11/19 1359)  Intake/Output from previous day: 11/19 0701 - 11/20 0700 In: 800 [I.V.:800] Out: 250 [Blood:250] Intake/Output this shift: No intake/output data recorded.  Exam: Awake, alert, pleasantly confused MAEx4 Posterior incision covered with honeycomb  Lab Results: Recent Labs    06/14/24 0404 06/15/24 0516  WBC 14.8* 17.6*  HGB 13.2 13.5  HCT 39.0 39.1  PLT 236 203   BMET Recent Labs    06/14/24 0404 06/15/24 0516  NA 132* 132*  K 4.5 4.3  CL 98 98  CO2 23 22  GLUCOSE 131* 123*  BUN 10 8  CREATININE 0.89 0.84  CALCIUM 8.4* 8.3*    Studies/Results: DG Cervical Spine 2 or 3 views Result Date: 06/14/2024 EXAM: 3 FLUOROSCOPIC INTRAOPERATIVE VIEW(S) XRAY OF THE CERVICAL SPINE 06/14/2024 06:32:00 PM COMPARISON: Prior CT examination of 06/13/2024. CLINICAL HISTORY: [id number] Surgery, elective [id number]. Surgery, elective [id number]. FINDINGS: BONES: Intraoperative posterior fusion with instrumentation of C1 and C2 with bilateral laminar screws and posterior bars. Dense fractures are again identified demonstrating 2 cortical width posterior displacement, with decreased displacement when  compared to prior CT examination of 06/13/2024. Alignment is altered by the fracture and instrumentation. No aggressive appearing osseous lesion. DISCS AND DEGENERATIVE CHANGES: No severe degenerative changes. SOFT TISSUES: Prevertebral soft tissue swelling again noted. The visualized lungs appear clear. FLUOROSCOPY: Fluoroscopy time: 9.1 seconds. Fluoroscopic dose: 1.6 mg. IMPRESSION: 1. Intraoperative posterior fusion with instrumentation of C1 and C2 with bilateral laminar screws and posterior bars. 2. Dens fractures with 2 cortical width posterior displacement, decreased compared to prior CT examination of 06/13/24. 3. Prevertebral soft tissue swelling. Electronically signed by: Dorethia Molt MD 06/14/2024 11:44 PM EST RP Workstation: [id number]   DG C-Arm 1-60 Min-No Report Result Date: 06/14/2024 Fluoroscopy was utilized by the requesting physician.  No radiographic interpretation.   DG C-Arm 1-60 Min-No Report Result Date: 06/14/2024 Fluoroscopy was utilized by the requesting physician.  No radiographic interpretation.   DG C-Arm 1-60 Min-No Report Result Date: 06/14/2024 Fluoroscopy was utilized by the requesting physician.  No radiographic interpretation.   DG O-ARM IMAGE ONLY/NO REPORT Result Date: 06/14/2024 There is no Radiologist interpretation  for this exam.  CT ANGIO HEAD NECK W WO CM Result Date: 06/14/2024 EXAM: CTA HEAD AND NECK WITH AND WITHOUT 06/14/2024 01:13:06 AM TECHNIQUE: CTA of the head and neck was performed with and without the administration of intravenous contrast. Multiplanar 2D and/or 3D reformatted images are provided for review. Automated exposure control, iterative reconstruction, and/or weight based adjustment of the mA/kV was utilized to reduce the radiation dose to as low as reasonably achievable. Stenosis of the internal carotid arteries measured using NASCET criteria. COMPARISON: MRI cervical spine from earlier today. CT cervical spine from Jun 13, 2024  CLINICAL HISTORY: Neck trauma, arterial injury suspected FINDINGS: Evaluation at the craniocervical junction is limited by streak artifact. Within this limitation: AORTIC ARCH AND ARCH VESSELS: No dissection or arterial injury. No significant stenosis of the brachiocephalic or subclavian arteries. CERVICAL CAROTID ARTERIES: No dissection, arterial injury, or hemodynamically significant stenosis by NASCET criteria. CERVICAL VERTEBRAL ARTERIES: No dissection, arterial injury, or significant stenosis. LUNGS AND MEDIASTINUM: Chronic bronchiectasis and nodularity in the lung apices, progressed since May 12, 2023 CT Chest. SOFT TISSUES: No acute abnormality. BONES: Known unstable C2 fracture characterized on MRI and CT cervical spine from today and Jun 13, 2024. ANTERIOR CIRCULATION: No significant stenosis of the internal carotid arteries. No significant stenosis of the anterior cerebral arteries. No significant stenosis of the middle cerebral arteries. No aneurysm. POSTERIOR CIRCULATION: No significant stenosis of the posterior cerebral arteries. Moderate basilar artery stenosis. No significant stenosis of the vertebral arteries. No aneurysm. OTHER: No dural venous sinus thrombosis on this non-dedicated study. IMPRESSION: 1. No specific evidence of arterial injury. 2. No large vessel occlusion. 3. Moderate basilar artery stenosis. 4. Chronic bronchiectasis and nodularity in the lung apices, progressed since May 12, 2023 CT Chest. Electronically signed by: Gilmore Molt MD 06/14/2024 01:26 AM EST RP Workstation: [id number]   MR CERVICAL SPINE WO CONTRAST Result Date: 06/14/2024 EXAM: MRI Cervical Spine Without Contrast 06/14/2024 12:19:55 AM TECHNIQUE: Multiplanar multisequence MRI of the cervical spine was performed. COMPARISON: CT cervical spine Jun 13, 2024 CLINICAL HISTORY: Neck trauma, ligament injury suspected (Age >= 16y) FINDINGS: BONES AND ALIGNMENT: The craniocervical junction is incompletely imaged.  Acute dens fracture with approximately 1.3 cm of anterior displacement. The dens tip is posteriorly displaced along with the occiput. The posterior longitudinal ligament is uplifted and thinned. Disruption of the anterior longitudinal ligament at the fracture site (see series 5 iamges 15/16). There is resulting moderate to severe canal stenosis. Fluid/hemorrhage within the fracture cleft. Adjacent prevertebral edema. Perching of the facets better characterized on the CT of the cervical spine. SPINAL CORD: No abnormal spinal cord signal. SOFT TISSUES: No paraspinal mass. C2-C3: Traumatic findings detailed above.  Moderate to severe canal stenosis. C3-C4: Right facet and uncovertebral hypertrophy. Severe right foraminal stenosis. Patent canal and left foramen. C4-C5: Bilateral facet and uncovertebral hypertrophy. Moderate left and mild right foraminal stenosis. C5-C6: Bilateral facet and uncovertebral hypertrophy. Moderate left and mild right foraminal stenosis. C6-C7: Left greater than right facet and uncovertebral hypertrophy. Severe left and mild right foraminal stenosis. C7-T1: Left greater than right facet and uncovertebral hypertrophy. Moderate left foraminal stenosis. IMPRESSION: 1. Unstable dens fracture with 1.3 cm anterior displacement, disrupted anterior longitudinal ligament and thinned/uplifted posterior longitudinal ligament. 2. Resulting moderate to severe  canal stenosis. Normal cord signal. 3. Perched facets better characterized on same day CT of the cervical spine. Electronically signed by: Gilmore Molt MD 06/14/2024 12:58 AM EST RP Workstation: [id number]   CT Head Wo Contrast Result Date: 06/13/2024 EXAM: CT HEAD, FACIAL BONES AND CERVICAL SPINE WITHOUT CONTRAST 06/13/2024 07:30:00 PM TECHNIQUE: CT of the head, facial bones and cervical spine was performed without the administration of intravenous contrast. Multiplanar reformatted images are provided for review. Automated exposure control,  iterative reconstruction, and/or weight based adjustment of the mA/kV was utilized to reduce the radiation dose to as low as reasonably achievable. COMPARISON: Ct cspine 10/02/20 CT chest 05/12/23 CLINICAL HISTORY: Unwitnessed fall, head injury. FINDINGS: CT HEAD BRAIN AND VENTRICLES: Atherosclerotic calcifications are present within the cavernous internal carotid and vertebral arteries. No acute  intracranial hemorrhage. No mass effect or midline shift. No extra-axial fluid collection. No evidence of acute infarct. No hydrocephalus. SKULL AND SCALP: No acute skull fracture. No scalp hematoma. CT FACIAL BONES FACIAL BONES: Acute bilateral nasal bone fractures. No mandibular dislocation. No suspicious bone lesion. ORBITS: No acute traumatic injury. SINUSES AND MASTOIDS: Polypoid-like left maxillary sinus mucosal thickening. The right maxillary sinus mucosal thickening. Right frontal sinus mucosal thickening. SOFT TISSUES: No acute abnormality. CT CERVICAL SPINE BONES AND ALIGNMENT: 1.3 cm anterior displacement of an acute displaced C2 dens fracture with associated perching of the C1 and C2 facets. Associated central canal stenosis at the C1-C2 level. Multilevel moderate degenerative changes of the spine. Associated right severe osseous neural foraminal stenosis at the C3-C4 level. Partial osseous fusion of the C2-C3 levels. DEGENERATIVE CHANGES: No significant degenerative changes. SOFT TISSUES: No prevertebral soft tissue swelling. Cavitary lesions versus bronchiectasis within the right upper lobe with associated nodular opacities As an example, a 1.5x1 cm cavitary lesion (4.99). IMPRESSION: 1. 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 Emergent neurosurgery consultation recommended. 2. Acute bilateral nasal bone fractures. 3. No acute intracranial abnormality . 4. Cavitary lesions versus  bronchiectasis within the right upper lobe. Findings worsened from CT chest 05/12/23 within the right upper lobe. These details were discussed with Dr. darra by Dr. Margarite over the phone on 06/13/2024 at 7:51 pm Electronically signed by: Morgane Naveau MD 06/13/2024 07:59 PM EST RP Workstation: [id number]   CT Cervical Spine Wo Contrast Result Date: 06/13/2024 EXAM: CT HEAD, FACIAL BONES AND CERVICAL SPINE WITHOUT CONTRAST 06/13/2024 07:30:00 PM TECHNIQUE: CT of the head, facial bones and cervical spine was performed without the administration of intravenous contrast. Multiplanar reformatted images are provided for review. Automated exposure control, iterative reconstruction, and/or weight based adjustment of the mA/kV was utilized to reduce the radiation dose to as low as reasonably achievable. COMPARISON: Ct cspine 10/02/20 CT chest 05/12/23 CLINICAL HISTORY: Unwitnessed fall, head injury. FINDINGS: CT HEAD BRAIN AND VENTRICLES: Atherosclerotic calcifications are present within the cavernous internal carotid and vertebral arteries. No acute intracranial hemorrhage. No mass effect or midline shift. No extra-axial fluid collection. No evidence of acute infarct. No hydrocephalus. SKULL AND SCALP: No acute skull fracture. No scalp hematoma. CT FACIAL BONES FACIAL BONES: Acute bilateral nasal bone fractures. No mandibular dislocation. No suspicious bone lesion. ORBITS: No acute traumatic injury. SINUSES AND MASTOIDS: Polypoid-like left maxillary sinus mucosal thickening. The right maxillary sinus mucosal thickening. Right frontal sinus mucosal thickening. SOFT TISSUES: No acute abnormality. CT CERVICAL SPINE BONES AND ALIGNMENT: 1.3 cm anterior displacement of an acute displaced C2 dens fracture with associated perching of the C1 and C2 facets. Associated central canal stenosis at the C1-C2 level. Multilevel moderate degenerative changes of the spine. Associated right severe osseous neural foraminal stenosis at the  C3-C4 level. Partial osseous fusion of the C2-C3 levels. DEGENERATIVE CHANGES: No significant degenerative changes. SOFT TISSUES: No prevertebral soft tissue swelling. Cavitary lesions versus bronchiectasis within the right upper lobe with associated nodular opacities As an example, a 1.5x1 cm cavitary lesion (4.99). IMPRESSION: 1. 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 Emergent neurosurgery consultation recommended. 2. Acute bilateral nasal bone fractures. 3. No acute intracranial abnormality . 4. Cavitary lesions versus bronchiectasis within the right  upper lobe. Findings worsened from CT chest 05/12/23 within the right upper lobe. These details were discussed with Dr. darra by Dr. Margarite over the phone on 06/13/2024 at 7:51 pm Electronically signed by: Morgane Naveau MD 06/13/2024 07:59 PM EST RP Workstation: [id number]   CT Maxillofacial Wo Contrast Result Date: 06/13/2024 EXAM: CT HEAD, FACIAL BONES AND CERVICAL SPINE WITHOUT CONTRAST 06/13/2024 07:30:00 PM TECHNIQUE: CT of the head, facial bones and cervical spine was performed without the administration of intravenous contrast. Multiplanar reformatted images are provided for review. Automated exposure control, iterative reconstruction, and/or weight based adjustment of the mA/kV was utilized to reduce the radiation dose to as low as reasonably achievable. COMPARISON: Ct cspine 10/02/20 CT chest 05/12/23 CLINICAL HISTORY: Unwitnessed fall, head injury. FINDINGS: CT HEAD BRAIN AND VENTRICLES: Atherosclerotic calcifications are present within the cavernous internal carotid and vertebral arteries. No acute intracranial hemorrhage. No mass effect or midline shift. No extra-axial fluid collection. No evidence of acute infarct. No hydrocephalus. SKULL AND SCALP: No acute skull fracture. No scalp hematoma. CT FACIAL BONES FACIAL BONES:  Acute bilateral nasal bone fractures. No mandibular dislocation. No suspicious bone lesion. ORBITS: No acute traumatic injury. SINUSES AND MASTOIDS: Polypoid-like left maxillary sinus mucosal thickening. The right maxillary sinus mucosal thickening. Right frontal sinus mucosal thickening. SOFT TISSUES: No acute abnormality. CT CERVICAL SPINE BONES AND ALIGNMENT: 1.3 cm anterior displacement of an acute displaced C2 dens fracture with associated perching of the C1 and C2 facets. Associated central canal stenosis at the C1-C2 level. Multilevel moderate degenerative changes of the spine. Associated right severe osseous neural foraminal stenosis at the C3-C4 level. Partial osseous fusion of the C2-C3 levels. DEGENERATIVE CHANGES: No significant degenerative changes. SOFT TISSUES: No prevertebral soft tissue swelling. Cavitary lesions versus bronchiectasis within the right upper lobe with associated nodular opacities As an example, a 1.5x1 cm cavitary lesion (4.99). IMPRESSION: 1. 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 Emergent neurosurgery consultation recommended. 2. Acute bilateral nasal bone fractures. 3. No acute intracranial abnormality . 4. Cavitary lesions versus bronchiectasis within the right upper lobe. Findings worsened from CT chest 05/12/23 within the right upper lobe. These details were discussed with Dr. darra by Dr. Margarite over the phone on 06/13/2024 at 7:51 pm Electronically signed by: Morgane Naveau MD 06/13/2024 07:59 PM EST RP Workstation: [id number]      Dorn JONELLE Glade 06/15/2024, 7:32 AM

## 2024-06-15 NOTE — Progress Notes (Signed)
  Inpatient Rehab Admissions Coordinator :  Per therapy recommendations, patient was screened for CIR candidacy by Ottie Glazier RN MSN.  At this time patient appears to be a potential candidate for CIR. I will place a rehab consult per protocol for full assessment. Please call me with any questions.  Ottie Glazier RN MSN Admissions Coordinator [phone]

## 2024-06-15 NOTE — Evaluation (Signed)
 Physical Therapy Evaluation Patient Details Name: Adrian Ray MRN: [id number] DOB: 1934-05-12 Today's Date: 06/15/2024  History of Present Illness  Pt is 89 yo presenting to [facility] On 11/18 following a ground level fall with type II odontoid fracture. Currently pt is s/p C1 reduction and C1-C2 fusion on 11/19. PMH: dementia, sensorineural hearing loss, hyperlipidemia, COPD.  Clinical Impression  Pt is currently presenting at Min A to Mod A for bed mobility, Min A for sit to stand and short non-functional in-home distance gait with RW. Pt demonstrates an increased risk for falls.  Pt has good family support at home. Pt was very active and independent prior to hospitalization playing tennis, driving and ambulating without an AD. Due to pt current functional status, home set up and available assistance at home recommending skilled physical therapy services > 3 hours/day in order to address strength, balance and functional mobility to decrease risk for falls, injury, immobility, skin break down and re-hospitalization.         If plan is discharge home, recommend the following: Assist for transportation;Assistance with cooking/housework     Equipment Recommendations BSC/3in1;Rolling walker (2 wheels)  Recommendations for Other Services  Rehab consult    Functional Status Assessment Patient has had a recent decline in their functional status and demonstrates the ability to make significant improvements in function in a reasonable and predictable amount of time.     Precautions / Restrictions Precautions Precautions: Fall;Cervical Precaution Booklet Issued: No Recall of Precautions/Restrictions: Impaired Precaution/Restrictions Comments: neurosurgery note states :No collar needed. Pt is wearing [place] J brace when entering room. Restrictions Weight Bearing Restrictions Per Provider Order: No Other Position/Activity Restrictions: cervical; no pushing/pulling lifting more than 5-10 lbs       Mobility  Bed Mobility Overal bed mobility: Needs Assistance Bed Mobility: Sidelying to Sit, Sit to Sidelying, Rolling Rolling: Mod assist Sidelying to sit: Min assist     Sit to sidelying: Min assist General bed mobility comments: verbal cues for sequencing, Mod A with initiation cues for rolling, Min A for trunk to midline and Min A for LE to EOB    Transfers Overall transfer level: Needs assistance Equipment used: Rolling walker (2 wheels) Transfers: Sit to/from Stand Sit to Stand: Min assist           General transfer comment: Min A from recliner/EOB to RW    Ambulation/Gait Ambulation/Gait assistance: Min Chemical Engineer (Feet): 30 Feet Assistive device: Rolling walker (2 wheels) Gait Pattern/deviations: Step-through pattern, Decreased step length - left, Decreased step length - right, Shuffle Gait velocity: decreased Gait velocity interpretation: <1.31 ft/sec, indicative of household ambulator   General Gait Details: short, uneven steps with flat foot initial contact and low floor clearance, Min A for balance and LE weakness.      Balance Overall balance assessment: Needs assistance Sitting-balance support: Bilateral upper extremity supported, Feet supported Sitting balance-Leahy Scale: Fair     Standing balance support: Bilateral upper extremity supported, During functional activity, Reliant on assistive device for balance Standing balance-Leahy Scale: Poor Standing balance comment: Min A to CGA       Pertinent Vitals/Pain Pain Assessment Pain Assessment: Faces Faces Pain Scale: Hurts little more Breathing: occasional labored breathing, short period of hyperventilation Negative Vocalization: occasional moan/groan, low speech, negative/disapproving quality Facial Expression: smiling or inexpressive Body Language: relaxed Consolability: no need to console PAINAD Score: 2 Pain Location: face, cervical area Pain Descriptors / Indicators:  Guarding, Grimacing, Constant Pain Intervention(s): Monitored during session, Limited activity within  patient's tolerance, Repositioned    Home Living Family/patient expects to be discharged to:: Private residence Living Arrangements: Spouse/significant other Available Help at Discharge: Family;Personal care attendant;Available 24 hours/day Type of Home: Independent living facility Home Access: Level entry       Home Layout: One level Home Equipment: Agricultural Consultant (2 wheels)      Prior Function Prior Level of Function : Independent/Modified Independent;Driving             Mobility Comments: pt reports ind with mobility ADLs Comments: active, ind with ADLs, lives at ind living facility     Extremity/Trunk Assessment   Upper Extremity Assessment Upper Extremity Assessment: Generalized weakness;Overall WFL for tasks assessed    Lower Extremity Assessment Lower Extremity Assessment: Generalized weakness    Cervical / Trunk Assessment Cervical / Trunk Assessment: Kyphotic;Neck Surgery  Communication   Communication Communication: Impaired Factors Affecting Communication: Hearing impaired    Cognition Arousal: Alert Behavior During Therapy: WFL for tasks assessed/performed, Flat affect   PT - Cognitive impairments: History of cognitive impairments, No apparent impairments       Following commands: Intact       Cueing Cueing Techniques: Verbal cues     General Comments General comments (skin integrity, edema, etc.): BP supine after 153/67, seated 134/69, standing 121/64, standing after 31m in 149/65 RN/MD notified.        Assessment/Plan    PT Assessment Patient needs continued PT services  PT Problem List Decreased strength;Decreased activity tolerance;Decreased balance;Decreased mobility;Pain       PT Treatment Interventions DME instruction;Balance training;Gait training;Functional mobility training;Patient/family education;Therapeutic  activities;Therapeutic exercise    PT Goals (Current goals can be found in the Care Plan section)  Acute Rehab PT Goals Patient Stated Goal: to improve mobility to return to ILF PT Goal Formulation: With patient Time For Goal Achievement: 06/29/24 Potential to Achieve Goals: Good    Frequency Min 3X/week        AM-PAC PT 6 Clicks Mobility  Outcome Measure Help needed turning from your back to your side while in a flat bed without using bedrails?: A Little Help needed moving from lying on your back to sitting on the side of a flat bed without using bedrails?: A Little Help needed moving to and from a bed to a chair (including a wheelchair)?: A Little Help needed standing up from a chair using your arms (e.g., wheelchair or bedside chair)?: A Little Help needed to walk in hospital room?: A Little Help needed climbing 3-5 steps with a railing? : A Lot 6 Click Score: 17    End of Session Equipment Utilized During Treatment: Gait belt Activity Tolerance: Patient tolerated treatment well Patient left: in chair;with call bell/phone within reach;with chair alarm set;with nursing/sitter in room Nurse Communication: Mobility status;Precautions PT Visit Diagnosis: Unsteadiness on feet (R26.81);Other abnormalities of gait and mobility (R26.89);Muscle weakness (generalized) (M62.81)    Time: 8484-8444 PT Time Calculation (min) (ACUTE ONLY): 40 min   Charges:   PT Evaluation $PT Eval Low Complexity: 1 Low PT Treatments $Therapeutic Activity: 23-37 mins PT General Charges $$ ACUTE PT VISIT: 1 Visit        Dorothyann Maier, DPT, CLT  Acute Rehabilitation Services Office: [phone] (Secure chat preferred)   Dorothyann VEAR Maier 06/15/2024, 4:14 PM

## 2024-06-15 NOTE — Progress Notes (Signed)
 Patient bilateral hearing aids and its charger is at bedside.

## 2024-06-15 NOTE — Progress Notes (Addendum)
 [facility]  PROGRESS NOTE  Adrian Ray FMW:[id number] DOB: 12-Jun-1934 DOA: 06/13/2024 PCP: Ransom Other, MD   Brief HPI:    89 y.o. male with medical history significant for sensorineural hearing loss, hyperlipidemia, COPD, who is admitted to [facility] on 06/13/2024 with unstable acute displaced C2 dens fracture after presenting from assisted living facility to [facility] ED complaining of fall.  The patient was ambulating on the sidewalk at his assisted living facility, where he is reported to live independently, when he reports that he tripped resulting in a fall straight forward, with the anterior aspect of his face serving as the principal point of contact with a cement.  He denies any associated loss of consciousness    Assessment/Plan:   Unstable acute displaced C2 dens fracture, status post posterior C1-2 fusion - S/p mechanical fall, no loss of consciousness -CT head showed no acute intracranial process - Neurosurgery consulted; underwent posterior C1-2 fusion on 11/19 -Neurosurgery has cleared for discharge -Will obtain PT evaluation  Status post fall/?  Syncope - Patient denies loss of consciousness - Poor historian, also has dementia - Will check echocardiogram to rule out underlying structural abnormality - Continue monitoring on telemetry - Will check orthostatic vital signs   Leukocytosis - WBC count 14,000 on admission; WBC is up to 17,000 - UA is clear - Has been afebrile, normal vital signs - Likely reactive Follow WBC in a.m.-   BPH - Tamsulosin on hold - Will restart Proscar   Interstitial lung disease - Stable, not requiring oxygen  - Continue Breo elliptica as needed  Hyperlipidemia - Continue to hold statin  Hyponatremia - Chronic, sodium is 132 - Follow serum sodium in a.m.  History of dementia - Aricept on hold - Delirium precautions    DVT prophylaxis: SCDs  Medications     fluticasone  furoate-vilanterol  1 puff Inhalation  Daily     Data Reviewed:   CBG:  No results for input(s): GLUCAP in the last 168 hours.  SpO2: 100 %    Vitals:   06/14/24 2029 06/15/24 0018 06/15/24 0340 06/15/24 0700  BP: (!) 149/82 122/62 (!) 143/72 124/65  Pulse: 62 85 80 88  Resp: 18  17 (!) 26  Temp: 97.6 F (36.4 C)  98.1 F (36.7 C) 98.7 F (37.1 C)  TempSrc: Oral     SpO2: 96% 100% 100%   Weight:      Height:          Data Reviewed:  Basic Metabolic Panel: Recent Labs  Lab 06/13/24 1848 06/14/24 0404 06/15/24 0516  NA 133* 132* 132*  K 3.7 4.5 4.3  CL 100 98 98  CO2 23 23 22   GLUCOSE 128* 131* 123*  BUN 10 10 8   CREATININE 0.79 0.89 0.84  CALCIUM 7.9* 8.4* 8.3*  MG  --  2.1  --     CBC: Recent Labs  Lab 06/13/24 1848 06/14/24 0404 06/15/24 0516  WBC 11.5* 14.8* 17.6*  NEUTROABS 8.9* 12.3*  --   HGB 13.2 13.2 13.5  HCT 39.7 39.0 39.1  MCV 96.6 96.3 94.4  PLT 235 236 203    LFT Recent Labs  Lab 06/13/24 1848 06/14/24 0404 06/15/24 0516  AST 22 20 27   ALT 12 12 12   ALKPHOS 94 97 95  BILITOT 0.6 0.7 1.3*  PROT 6.2* 6.8 6.1*  ALBUMIN 3.1* 3.2* 2.9*     Antibiotics: Anti-infectives (From admission, onward)    Start     Dose/Rate Route Frequency Ordered  Stop   06/14/24 0703  ceFAZolin (ANCEF) IVPB 2g/100 mL premix        2 g 200 mL/hr over 30 Minutes Intravenous 30 min pre-op 06/14/24 0703 06/14/24 1638        CONSULTS neurosurgery  Code Status: Full code  Family Communication: Discussed with patient's daughter at bedside     Subjective   Patient seen and examined.  Still complains of neck pain.   Objective    Physical Examination:  Appears in no acute distress S1-S2, regular Lungs clear to auscultation bilaterally Abdomen is soft, nontender   Status is: Inpatient:             Adrian Ray   [facility] If 7PM-7AM, please contact night-coverage at [email or web address], Office  [phone]   06/15/2024, 8:39 AM  LOS: 2 days

## 2024-06-15 NOTE — TOC CAGE-AID Note (Signed)
 Transition of Care [facility]) - CAGE-AID Screening   Patient Details  Name: Adrian Ray MRN: [id number] Date of Birth: September 23, 1933  Transition of Care [facility]) CM/SW Contact:    Trestin Vences E Emmarae Cowdery, LCSW Phone Number: 06/15/2024, 9:13 AM   Clinical Narrative: Disoriented x 3.   CAGE-AID Screening: Substance Abuse Screening unable to be completed due to: : Patient unable to participate

## 2024-06-16 ENCOUNTER — Encounter (HOSPITAL_COMMUNITY): Payer: Self-pay

## 2024-06-16 ENCOUNTER — Inpatient Hospital Stay (HOSPITAL_COMMUNITY)

## 2024-06-16 DIAGNOSIS — R55 Syncope and collapse: Secondary | ICD-10-CM

## 2024-06-16 DIAGNOSIS — D72829 Elevated white blood cell count, unspecified: Secondary | ICD-10-CM | POA: Diagnosis not present

## 2024-06-16 DIAGNOSIS — M532X1 Spinal instabilities, occipito-atlanto-axial region: Secondary | ICD-10-CM | POA: Diagnosis not present

## 2024-06-16 DIAGNOSIS — S12120A Other displaced dens fracture, initial encounter for closed fracture: Secondary | ICD-10-CM | POA: Diagnosis not present

## 2024-06-16 LAB — ECHOCARDIOGRAM COMPLETE
Area-P 1/2: 2.72 cm2
Height: 74.5 in
S' Lateral: 2.7 cm
Weight: 2879.98 [oz_av]

## 2024-06-16 LAB — BASIC METABOLIC PANEL WITH GFR
Anion gap: 10 (ref 5–15)
BUN: 10 mg/dL (ref 8–23)
CO2: 22 mmol/L (ref 22–32)
Calcium: 8.1 mg/dL — ABNORMAL LOW (ref 8.9–10.3)
Chloride: 96 mmol/L — ABNORMAL LOW (ref 98–111)
Creatinine, Ser: 0.86 mg/dL (ref 0.61–1.24)
GFR, Estimated: >60 mL/min (ref >60)
Glucose, Bld: 108 mg/dL — ABNORMAL HIGH (ref 70–99)
Potassium: 3.7 mmol/L (ref 3.5–5.1)
Sodium: 128 mmol/L — ABNORMAL LOW (ref 135–145)

## 2024-06-16 LAB — CBC
HCT: 35.4 % — ABNORMAL LOW (ref 39.0–52.0)
Hemoglobin: 12.2 g/dL — ABNORMAL LOW (ref 13.0–17.0)
MCH: 32.4 pg (ref 26.0–34.0)
MCHC: 34.5 g/dL (ref 30.0–36.0)
MCV: 94.1 fL (ref 80.0–100.0)
Platelets: 214 K/uL (ref 150–400)
RBC: 3.76 MIL/uL — ABNORMAL LOW (ref 4.22–5.81)
RDW: 13 % (ref 11.5–15.5)
WBC: 15.9 K/uL — ABNORMAL HIGH (ref 4.0–10.5)
nRBC: 0 % (ref 0.0–0.2)

## 2024-06-16 MED ORDER — BACITRACIN-NEOMYCIN-POLYMYXIN OINTMENT TUBE
TOPICAL_OINTMENT | Freq: Two times a day (BID) | CUTANEOUS | Status: DC
  Filled 2024-06-16: qty 14

## 2024-06-16 MED ORDER — OXYCODONE HCL 5 MG PO TABS: 2.5000 mg | ORAL_TABLET | ORAL | Status: DC | PRN

## 2024-06-16 NOTE — Progress Notes (Signed)
 CSW spoke with pt daughter Geni.  She just spoke with Breck, who answered her questions.  She is hopeful for admission to CIR prior to return to [facility]. Cathlyn Ferry, MSW, LCSW 11/21/20252:03 PM

## 2024-06-16 NOTE — Progress Notes (Signed)
 CSW spoke with pt daughters Geni and Hebron.  Pt from [facility], independent living.  Very active, plays tennis.  They are aware of STR at [facility] but would like pt considered for CIR instead.  CSW also received message from [facility] and updated her as to the above.  PT eval pending. Cathlyn Ferry, MSW, LCSW 11/21/20258:40 AM

## 2024-06-16 NOTE — Care Management Important Message (Signed)
 Important Message  Patient Details  Name: TEO MOEDE MRN: [id number] Date of Birth: 1933/08/11   Important Message Given:        [name] 06/16/2024, 1:01 PM

## 2024-06-16 NOTE — PMR Pre-admission (Signed)
 PMR Admission Coordinator Pre-Admission Assessment  Patient: Adrian Ray is an 89 y.o., male MRN: [id number] DOB: 10-21-1933 Height: (P) 6' 2.5 (189.2 cm) Weight: (P) 81.6 kg  Insurance Information HMO:     PPO:      PCP:      IPA:      80/20:      OTHER:  PRIMARY: Medicare Part A and B      Policy#: [id number]      Subscriber: pt Benefits:  Phone #: passport one source online     Name: 11/21 Eff. Date: a 10/25/2000 and b 07/27/2010     Deduct: $1676      Out of Pocket Max: none      Life Max: none CIR: 100%      SNF: 20 full days Outpatient: 80%     Co-Pay: 20% Home Health: 100%      Co-Pay:  DME: 80%     Co-Pay: 20% Providers: pt choice  SECONDARY: [employer]      Policy#: [id number]     Phone#:   Artist:       Phone#:   The "Data Collection Information Summary" for patients in Inpatient Rehabilitation Facilities with attached "Privacy Act Statement-Health Care Records" was provided and verbally reviewed with: Family  Emergency Contact Information Contact Information     Name Relation Home Work Mobile   Finkle,Erica Daughter   [phone]   Dorlene Maize Daughter [phone]  [phone]      Other Contacts   None on File    Current Medical History  Patient Admitting Diagnosis: Odontoid fx  History of Present Illness: 89 yo male with recent diagnosis of dementia ( independent living, adls, recent MCA 6 weeks ago) very HOH even with hearing aides, BPH, COPD, diverticulosis, HLD, and GERD who presented on 06/13/24 with ground level fall, with neck hyperextension. CT showed posteriorly displaced odontoid fracture.   Neurosurgery consulted. Underwent C1-2 fusion on 11/19. Postop pain management and mobilization. Syncope workup negative. Orthostatics normal.Leukocytosis, afebrile and UA clear. Felt likely reactive. Chronic hyponatremia. Follow labs and fluid restriction. Delirium precautions and Aricept on hold.   Patient's medical record from [facility] has been reviewed by the rehabilitation admission coordinator and physician.  Past Medical History  Past Medical History:  Diagnosis Date   BPH (benign prostatic hyperplasia)    Cataracts, bilateral    Clavicle fracture    left   COPD (chronic obstructive pulmonary disease) (HCC)    Dementia (HCC)    GERD (gastroesophageal reflux disease)    Hard of hearing    Hyperlipemia    Has the patient had major surgery during 100 days prior to admission? Yes  Family History   family history is not on file.  Current Medications  Current Facility-Administered Medications:    acetaminophen  (TYLENOL ) tablet 650 mg, 650 mg, Oral, Q6H PRN **OR** acetaminophen  (TYLENOL ) suppository 650 mg, 650 mg, Rectal, Q6H PRN, Howerter, Justin B, DO   albuterol  (PROVENTIL ) (2.5 MG/3ML) 0.083% nebulizer solution 2.5 mg, 2.5 mg, Nebulization, Q4H PRN, Howerter, Justin B, DO   cyclobenzaprine  (FLEXERIL ) tablet 5 mg, 5 mg, Oral, TID PRN, Darnella Dorn SAUNDERS, MD   diazepam  (VALIUM ) injection 5 mg, 5 mg, Intravenous, Once PRN, Darnella Dorn SAUNDERS, MD   fentaNYL  (SUBLIMAZE ) injection 25 mcg, 25 mcg, Intravenous, Q2H PRN, Howerter, Justin B, DO, 25 mcg at 06/14/24 0615   finasteride  (PROSCAR ) tablet 5 mg, 5 mg, Oral, Daily, Drusilla, Sabas RAMAN, MD, 5 mg at 06/16/24  9083   fluticasone  (FLONASE ) 50 MCG/ACT nasal spray 2 spray, 2 spray, Each Nare, Daily, Drusilla, Sabas RAMAN, MD, 2 spray at 06/16/24 9082   fluticasone  furoate-vilanterol (BREO ELLIPTA ) 100-25 MCG/ACT 1 puff, 1 puff, Inhalation, Daily, Howerter, Justin B, DO, 1 puff at 06/16/24 0802   melatonin tablet 3 mg, 3 mg, Oral, QHS PRN, Howerter, Justin B, DO   naloxone  (NARCAN ) injection 0.4 mg, 0.4 mg, Intravenous, PRN, Howerter, Justin B, DO   neomycin -bacitracin -polymyxin (NEOSPORIN) ointment, , Topical, BID, Hammons, Kimberly B, RPH   ondansetron  (ZOFRAN ) injection 4 mg, 4 mg, Intravenous, Q6H PRN, Howerter, Justin B, DO   oxyCODONE  (Oxy IR/ROXICODONE ) immediate  release tablet 5 mg, 5 mg, Oral, Q4H PRN, Darnella, Dorn SAUNDERS, MD  Patients Current Diet:  Diet Order             Diet regular Room service appropriate? Yes; Fluid consistency: Thin; Fluid restriction: 1500 mL Fluid  Diet effective now                  Precautions / Restrictions Precautions Precautions: Fall, Cervical Precaution Booklet Issued: No Precaution/Restrictions Comments: neurosurgery note states :No collar needed. Pt has order still in place for hard collar. Pt is wearing [place] J brace when entering room. Restrictions Weight Bearing Restrictions Per Provider Order: No Other Position/Activity Restrictions: cervical; no pushing/pulling lifting more than 5-10 lbs   Has the patient had 2 or more falls or a fall with injury in the past year? Yes  Prior Activity Level Community (5-7x/wk): independent living with decline in function over past 6 weeks. MCA. Lost drivers licence 2 weeks ago. Previously played tennis.  Prior Functional Level Self Care: Did the patient need help bathing, dressing, using the toilet or eating? Independent  Indoor Mobility: Did the patient need assistance with walking from room to room (with or without device)? Independent  Stairs: Did the patient need assistance with internal or external stairs (with or without device)? Independent  Functional Cognition: Did the patient need help planning regular tasks such as shopping or remembering to take medications? Independent  Patient Information Are you of Hispanic, Latino/a,or Spanish origin?: A. No, not of Hispanic, Latino/a, or Spanish origin What is your race?: A. White Do you need or want an interpreter to communicate with a doctor or health care staff?: 0. No  Patient's Response To:  Health Literacy and Transportation Is the patient able to respond to health literacy and transportation needs?: No Health Literacy - How often do you need to have someone help you when you read instructions,  pamphlets, or other written material from your doctor or pharmacy?: Patient unable to respond In the past 12 months, has lack of transportation kept you from medical appointments or from getting medications?: No (per dtr) In the past 12 months, has lack of transportation kept you from meetings, work, or from getting things needed for daily living?: No (per dtr) Primary School Teacher and Transportation obtained via proxy: yes, Insurance Risk Surveyor / Equipment Home Equipment: Agricultural Consultant (2 wheels)  Prior Device Use: Indicate devices/aids used by the patient prior to current illness, exacerbation or injury? None of the above  Current Functional Level Cognition  Orientation Level: Oriented to person, Oriented to place, Disoriented to time, Oriented to situation    Extremity Assessment (includes Sensation/Coordination)  Upper Extremity Assessment: Generalized weakness, Overall WFL for tasks assessed  Lower Extremity Assessment: Generalized weakness    ADLs       Mobility  Overal bed  mobility: Needs Assistance Bed Mobility: Sidelying to Sit, Sit to Sidelying, Rolling Rolling: Mod assist Sidelying to sit: Min assist Sit to sidelying: Min assist General bed mobility comments: Pt in recliner on arrival.    Transfers  Overall transfer level: Needs assistance Equipment used: Rolling walker (2 wheels) Transfers: Sit to/from Stand Sit to Stand: Min assist, Mod assist General transfer comment: min assist from recliner. Mod assist from low toilet.    Ambulation / Gait / Stairs / Wheelchair Mobility  Ambulation/Gait Ambulation/Gait assistance: Min assist, +2 safety/equipment Gait Distance (Feet): 165 Feet Assistive device: Rolling walker (2 wheels) Gait Pattern/deviations: Step-through pattern, Decreased stride length General Gait Details: Assist for balance. Attempted to assess amb without AD but pt unable to take steps without BUE support. +2 for chair follow Gait velocity:  decreased Gait velocity interpretation: <1.31 ft/sec, indicative of household ambulator    Posture / Balance Balance Overall balance assessment: Needs assistance Sitting-balance support: Feet supported, No upper extremity supported Sitting balance-Leahy Scale: Fair Standing balance support: Bilateral upper extremity supported, During functional activity, Reliant on assistive device for balance Standing balance-Leahy Scale: Poor Standing balance comment: Min A to CGA    Special considerations/life events  Delirium precautions' Fall precautions HOH even with hearing aids. Must write on wipe board to communicate Very independent prior to 6 weeks ago   Previous Home Environment  Living Arrangements: Alone (wife is SNF at [facility])  Lives With: Alone Available Help at Discharge:  (family can arrange 24/7 care, but anticipated SNF [facility] then ALF after CIR) Type of Home: Independent living facility Care Facility Name: [facility] Home Layout: One level Home Access: Level entry Bathroom Shower/Tub: Health Visitor: Standard Bathroom Accessibility: Yes How Accessible: Accessible via walker Home Care Services: No Type of Home Care Services: Homehealth aide  Discharge Living Setting Plans for Discharge Living Setting: Other (Comment) Gatha plans SNF at [facility] after CIR, then ALF) Type of Home at Discharge: Skilled Nursing Facility Care Facility Name at Discharge: [facility] SNF then ALF at [facility] Discharge Home Layout: One level Discharge Home Access: Level entry Discharge Bathroom Shower/Tub: Walk-in shower Discharge Bathroom Toilet: Handicapped height Discharge Bathroom Accessibility: Yes How Accessible: Accessible via walker Does the patient have any problems obtaining your medications?: No  Social/Family/Support Systems Patient Roles: Spouse, Parent Contact Information: daughters Geni and Rolland are medical POAs. Geni lives [place] area and  [place] on the [address] Anticipated Caregiver: [facility] SNF/ALF Anticipated Caregiver's Contact Information: see contacts Ability/Limitations of Caregiver: daughters live out of state Caregiver Availability: 24/7 (family can arrange 24/7 caregivers at ALF) Discharge Plan Discussed with Primary Caregiver: Yes Is Caregiver In Agreement with Plan?: Yes Does Caregiver/Family have Issues with Lodging/Transportation while Pt is in Rehab?: No  Goals Patient/Family Goal for Rehab: supervision to min with PT and OT Expected length of stay: ELOS 5 to 7 days Pt/Family Agrees to Admission and willing to participate: Yes Program Orientation Provided & Reviewed with Pt/Caregiver Including Roles  & Responsibilities: Yes  Decrease burden of Care through IP rehab admission: n/a  Possible need for SNF placement upon discharge: [facility] SNF then ALF. Daughter, Geni , has spoken with DON at [facility] to begin arrangements   Patient Condition: I have reviewed medical records from [facility], spoken with CM, and patient and daughter. I met with patient at the bedside and discussed via phone for inpatient rehabilitation assessment.  Patient will benefit from ongoing PT and OT, can actively participate in 3 hours of therapy a day  5 days of the week, and can make measurable gains during the admission.  Patient will also benefit from the coordinated team approach during an Inpatient Acute Rehabilitation admission.  The patient will receive intensive therapy as well as Rehabilitation physician, nursing, social worker, and care management interventions.  Due to bladder management, bowel management, safety, skin/wound care, disease management, medication administration, pain management, and patient education the patient requires 24 hour a day rehabilitation nursing.  The patient is currently min to mod assist overall with mobility and basic ADLs.  Discharge setting and therapy post discharge at  SNF or  assisted living facility is anticipated.  Patient has agreed to participate in the Acute Inpatient Rehabilitation Program and will admit tomorrow.  Preadmission Screen Completed By:  Alison Heron Lot, RN MSN11/21/2025 3:15 PM ______________________________________________________________________   Discussed status with Dr. Carilyn on 06/16/24 at 1500 and received approval for admission tomorrow when bed is available.  Admission Coordinator:  Alison Heron Lot, RN, time 1500 Date 06/16/24   Assessment/Plan: Diagnosis: displaced C2 fracture due to fall  Does the need for close, 24 hr/day Medical supervision in concert with the patient's rehab needs make it unreasonable for this patient to be served in a less intensive setting? Yes Co-Morbidities requiring supervision/potential complications: hx of MCA infarct, mild Alzheimers dementia, severe hearing impairment, COPD Due to bladder management, bowel management, safety, skin/wound care, disease management, medication administration, pain management, and patient education, does the patient require 24 hr/day rehab nursing? Yes Does the patient require coordinated care of a physician, rehab nurse, PT, OT, and SLP to address physical and functional deficits in the context of the above medical diagnosis(es)? Yes Addressing deficits in the following areas: balance, endurance, locomotion, strength, transferring, bowel/bladder control, bathing, dressing, toileting, and cognition Can the patient actively participate in an intensive therapy program of at least 3 hrs of therapy 5 days a week? Yes The potential for patient to make measurable gains while on inpatient rehab is good Anticipated functional outcomes upon discharge from inpatient rehab: supervision PT, supervision OT, supervision SLP Estimated rehab length of stay to reach the above functional goals is: 5-7d Anticipated discharge destination: Home 10. Overall Rehab/Functional  Prognosis: good   MD Signature: Prentice CHARLENA Carilyn M.D. [facility] Fellow Am Acad of Phys Med and Rehab Diplomate Am Board of Electrodiagnostic Med Fellow Am Board of Interventional Pain

## 2024-06-16 NOTE — Progress Notes (Signed)
 Physical Therapy Treatment Patient Details Name: Adrian Ray MRN: [id number] DOB: 1934-06-27 Today's Date: 06/16/2024   History of Present Illness Pt is 89 yo presenting to [facility] On 11/18 following a ground level fall with type II odontoid fracture. Currently pt is s/p C1 reduction and C1-C2 fusion on 11/19. PMH: dementia, sensorineural hearing loss, hyperlipidemia, COPD.    PT Comments  Pt received in recliner. Min assist sit to stand from recliner, Mod assist STS from low toilet, and min assist amb 165' with RW +2 chair follow (but not needed). After hallway amb, pt assisted into bathroom for BM, then he stood at the sink to wash his hands and brush his teeth. Pt in recliner at end of session. Current POC remains appropriate.     If plan is discharge home, recommend the following: Assist for transportation;Assistance with cooking/housework   Can travel by private vehicle        Equipment Recommendations  BSC/3in1;Rolling walker (2 wheels)    Recommendations for Other Services       Precautions / Restrictions Precautions Precautions: Fall;Cervical Recall of Precautions/Restrictions: Impaired Precaution/Restrictions Comments: neurosurgery note states :No collar needed. Pt has order still in place for hard collar. Pt is wearing [place] J brace when entering room. Restrictions Other Position/Activity Restrictions: cervical; no pushing/pulling lifting more than 5-10 lbs     Mobility  Bed Mobility               General bed mobility comments: Pt in recliner on arrival.    Transfers Overall transfer level: Needs assistance Equipment used: Rolling walker (2 wheels) Transfers: Sit to/from Stand Sit to Stand: Min assist, Mod assist           General transfer comment: min assist from recliner. Mod assist from low toilet.    Ambulation/Gait Ambulation/Gait assistance: Min assist, +2 safety/equipment Gait Distance (Feet): 165 Feet Assistive device: Rolling walker (2  wheels) Gait Pattern/deviations: Step-through pattern, Decreased stride length Gait velocity: decreased Gait velocity interpretation: <1.31 ft/sec, indicative of household ambulator   General Gait Details: Assist for balance. Attempted to assess amb without AD but pt unable to take steps without BUE support. +2 for chair follow   Stairs             Wheelchair Mobility     Tilt Bed    Modified Rankin (Stroke Patients Only)       Balance Overall balance assessment: Needs assistance Sitting-balance support: Feet supported, No upper extremity supported Sitting balance-Leahy Scale: Fair     Standing balance support: Bilateral upper extremity supported, During functional activity, Reliant on assistive device for balance Standing balance-Leahy Scale: Poor                              Communication Communication Communication: Impaired Factors Affecting Communication: Hearing impaired  Cognition Arousal: Alert Behavior During Therapy: WFL for tasks assessed/performed, Flat affect   PT - Cognitive impairments: History of cognitive impairments, Difficult to assess Difficult to assess due to: Hard of hearing/deaf                     PT - Cognition Comments: Very HOH. Use of white board for communication helpful Following commands: Intact, Impaired Following commands impaired: Follows one step commands with increased time    Cueing Cueing Techniques: Verbal cues, Tactile cues  Exercises      General Comments        Pertinent  Vitals/Pain Pain Assessment Pain Assessment: Faces Faces Pain Scale: Hurts little more Pain Location: surgical site Pain Descriptors / Indicators: Sore, Grimacing Pain Intervention(s): Monitored during session    Home Living                          Prior Function            PT Goals (current goals can now be found in the care plan section) Acute Rehab PT Goals Patient Stated Goal:  independence Progress towards PT goals: Progressing toward goals    Frequency    Min 3X/week      PT Plan      Co-evaluation              AM-PAC PT 6 Clicks Mobility   Outcome Measure  Help needed turning from your back to your side while in a flat bed without using bedrails?: A Little Help needed moving from lying on your back to sitting on the side of a flat bed without using bedrails?: A Little Help needed moving to and from a bed to a chair (including a wheelchair)?: A Little Help needed standing up from a chair using your arms (e.g., wheelchair or bedside chair)?: A Little Help needed to walk in hospital room?: A Little Help needed climbing 3-5 steps with a railing? : A Lot 6 Click Score: 17    End of Session Equipment Utilized During Treatment: Gait belt;Cervical collar Activity Tolerance: Patient tolerated treatment well Patient left: in chair;with call bell/phone within reach;with chair alarm set;with family/visitor present Nurse Communication: Mobility status PT Visit Diagnosis: Unsteadiness on feet (R26.81);Other abnormalities of gait and mobility (R26.89);Muscle weakness (generalized) (M62.81)     Time: 8888-8852 PT Time Calculation (min) (ACUTE ONLY): 36 min  Charges:    $Gait Training: 8-22 mins $Therapeutic Activity: 8-22 mins PT General Charges $$ ACUTE PT VISIT: 1 Visit                     Sari MATSU., PT  Office # [phone]    Erven Sari Shaker 06/16/2024, 12:41 PM

## 2024-06-16 NOTE — Progress Notes (Addendum)
 Assessment 89 year old male with high functioning recent diagnosis of dementia who presents after ground-level fall and imaging showing severely displaced odontoid fracture without neurologic injury. Underwent posterior C1-2 fusion on 11/19  LOS: 3 days    Plan: AAT, no collar needed DAT Ok for DVT ppx on 11/21 Please discharge patient with oxycodone  and flexeril . Our office can provide refills once he leaves as an outpatient Daughters requesting to restart home medications Medically clear for discharge from nsgy standpoint  Subjective: Patient being screened for CIR. Neurologically stable  Objective: Vital signs in last 24 hours: Temp:  [97.7 F (36.5 C)-98.5 F (36.9 C)] 97.8 F (36.6 C) (11/21 0409) Pulse Rate:  [72-82] 72 (11/21 0409) Resp:  [16-17] 17 (11/21 0409) BP: (127-135)/(58-60) 127/60 (11/21 0409) SpO2:  [97 %-98 %] 97 % (11/21 0409)  Intake/Output from previous day: 11/20 0701 - 11/21 0700 In: 240 [P.O.:240] Out: 950 [Urine:950] Intake/Output this shift: No intake/output data recorded.  Exam: Awake, alert, pleasantly confused MAEx4 Posterior incision covered with honeycomb  Lab Results: Recent Labs    06/15/24 0516 06/16/24 0437  WBC 17.6* 15.9*  HGB 13.5 12.2*  HCT 39.1 35.4*  PLT 203 214   BMET Recent Labs    06/15/24 0516 06/16/24 0437  NA 132* 128*  K 4.3 3.7  CL 98 96*  CO2 22 22  GLUCOSE 123* 108*  BUN 8 10  CREATININE 0.84 0.86  CALCIUM 8.3* 8.1*    Studies/Results: DG Cervical Spine 2 or 3 views Result Date: 06/15/2024 EXAM: 2 or 3 VIEW(S) XRAY OF THE CERVICAL SPINE 06/15/2024 11:34:00 AM COMPARISON: Comparison 06/13/2024. CLINICAL HISTORY: Postoperative for C2 fracture comparison 06/13/2024. FINDINGS: BONES: Posterolateral rod and screw fixation observed at C1-C2 with marked improvement in displacement of the prior fracture, currently 2 mm posterior displacement of the odontoid with respect to the C2 vertebral body, previously  10 mm. No complicating feature observed. Interbody and posterior element fusion at C2-C3. Fused facet joints bilaterally at C4-C5. Bony demineralization. Alignment is normal. No aggressive appearing osseous lesion. DISCS AND DEGENERATIVE CHANGES: Cervical spondylosis is present. SOFT TISSUES: Expected gas in the soft tissues. No prevertebral soft tissue swelling. The visualized lungs appear clear. IMPRESSION: 1. Marked improvement in displacement of the prior C2 fracture with posterolateral rod and screw fixation at C1-2, with 2 mm posterior displacement of the odontoid relative to the C2 vertebral body, previously 10 mm, without complicating features. 2. Interbody and posterior element fusion at C2-3. 3. Cervical spondylosis. 4. Bony demineralization. 5. Fused facet joints bilaterally at C4-5. Electronically signed by: Ryan Salvage MD 06/15/2024 03:36 PM EST RP Workstation: [id number]   DG Cervical Spine 2 or 3 views Result Date: 06/14/2024 EXAM: 3 FLUOROSCOPIC INTRAOPERATIVE VIEW(S) XRAY OF THE CERVICAL SPINE 06/14/2024 06:32:00 PM COMPARISON: Prior CT examination of 06/13/2024. CLINICAL HISTORY: [id number] Surgery, elective [id number]. Surgery, elective [id number]. FINDINGS: BONES: Intraoperative posterior fusion with instrumentation of C1 and C2 with bilateral laminar screws and posterior bars. Dense fractures are again identified demonstrating 2 cortical width posterior displacement, with decreased displacement when compared to prior CT examination of 06/13/2024. Alignment is altered by the fracture and instrumentation. No aggressive appearing osseous lesion. DISCS AND DEGENERATIVE CHANGES: No severe degenerative changes. SOFT TISSUES: Prevertebral soft tissue swelling again noted. The visualized lungs appear clear. FLUOROSCOPY: Fluoroscopy time: 9.1 seconds. Fluoroscopic dose: 1.6 mg. IMPRESSION: 1. Intraoperative posterior fusion with instrumentation of C1 and C2 with bilateral laminar screws and posterior bars.  2. Dens fractures with 2 cortical width posterior displacement,  decreased compared to prior CT examination of 06/13/24. 3. Prevertebral soft tissue swelling. Electronically signed by: Dorethia Molt MD 06/14/2024 11:44 PM EST RP Workstation: [id number]   DG C-Arm 1-60 Min-No Report Result Date: 06/14/2024 Fluoroscopy was utilized by the requesting physician.  No radiographic interpretation.   DG C-Arm 1-60 Min-No Report Result Date: 06/14/2024 Fluoroscopy was utilized by the requesting physician.  No radiographic interpretation.   DG C-Arm 1-60 Min-No Report Result Date: 06/14/2024 Fluoroscopy was utilized by the requesting physician.  No radiographic interpretation.   DG O-ARM IMAGE ONLY/NO REPORT Result Date: 06/14/2024 There is no Radiologist interpretation  for this exam.     Adrian Ray Glade 06/16/2024, 7:21 AM

## 2024-06-16 NOTE — Progress Notes (Signed)
 Mobility Specialist Progress Note:   06/16/24 1533  Mobility  Activity Ambulated with assistance (In hallway)  Level of Assistance Minimal assist, patient does 75% or more  Assistive Device Front wheel walker  Distance Ambulated (ft) 105 ft  Activity Response Tolerated well  Mobility Referral Yes  Mobility visit 1 Mobility  Mobility Specialist Start Time (ACUTE ONLY) 1514  Mobility Specialist Stop Time (ACUTE ONLY) 1531  Mobility Specialist Time Calculation (min) (ACUTE ONLY) 17 min   Received pt in bed and agreeable to mobility. Pt required MinA STS, otherwise MinG. Pt c/o neck pain, otherwise tolerated well. Returned to room without fault. Left pt in bed with alarm on. Personal belongings and call light within reach. All needs met. Nursing student present.  Lavanda Pollack Mobility Specialist  Please contact via [employer] or  Rehab Office [phone]

## 2024-06-16 NOTE — H&P (Shared)
 Physical Medicine and Rehabilitation Admission H&P    Chief Complaint  Patient presents with   Funcitonal deficits due to sequalae from unstable odontoid Fx    HPI: Adrian Ray is an 89 year old male with history of COPD, bronchiectasis, BPH, B 12 deficiency, recent left clavicle Fx 6 weeks ago, Alzheimer's dementia who was admitted from his ILF after unwitnessed fall onto face with reports of neck pain and had multiple abrasions on BUE/BLE and face. He reported that he was walking when he felt weak, bent over and then fell onto his face. 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 He was taken to OR for C1-C2 segmental instrumentation and fusion with reduction of C2 Fx by Dr. Darnella. Post op PT/OT evaluations done and patient noted to be requiring min assist for mobility with BUE support, has balance deficits as well as hearing loss with ability to follow simple commands with increased time. He is limited by neck pain ad needs communication board for conversation. He was independent PTA and therapy/family requested CIR due to functional decline.    Review of Systems  HENT:  Positive for hearing loss and tinnitus.   Eyes:  Negative for blurred vision.  Respiratory:  Negative for cough and shortness of breath.   Cardiovascular:  Negative for chest pain and palpitations.  Gastrointestinal:  Negative for abdominal pain.  Musculoskeletal:  Positive for falls (unsteady gait).  Neurological:  Positive for weakness.     Past Medical History:  Diagnosis Date   BPH (benign prostatic hyperplasia)    Cataracts, bilateral    Clavicle fracture    left   COPD (chronic obstructive pulmonary disease) (HCC)    Dementia (HCC)    GERD (gastroesophageal reflux disease)    Hard of hearing    Hyperlipemia     Past Surgical History:  Procedure Laterality Date   APPENDECTOMY     POSTERIOR CERVICAL FUSION/FORAMINOTOMY N/A  06/14/2024   Procedure: POSTERIOR CERVICAL FUSION/FORAMINOTOMY CERVICAL ONE- TWO;  Surgeon: Darnella Dorn SAUNDERS, MD;  Location: [facility] OR;  Service: Neurosurgery;  Laterality: N/A;  POSTERIOR CERVICAL FUSION/ C1-C2   SHOULDER SURGERY Right    2003    History reviewed. No pertinent family history.   Social History: Married. Retired. Used to smoke cigarettes X 25 years.   reports that he has never smoked. He has never used smokeless tobacco. He reports current alcohol use. He reports that he does not use drugs.   Allergies  Allergen Reactions   Tetracycline Swelling    Lip swelling   Codeine Other (See Comments)    Unknown reaction   Shellfish Allergy  Hives and Nausea And Vomiting    Medications Prior to Admission  Medication Sig Dispense Refill   Cyanocobalamin  (B-12 PO) Take 1 tablet by mouth daily.     donepezil (ARICEPT) 5 MG tablet Take 5 mg by mouth at bedtime.     finasteride  (PROSCAR ) 5 MG tablet Take 5 mg by mouth daily.     fluticasone  (FLONASE ) 50 MCG/ACT nasal spray Place 2 sprays into both nostrils daily. 16 g 10   rosuvastatin (CRESTOR) 5 MG tablet Take 1 tablet by mouth every Monday, Wednesday, and Friday.       Home: Home Living Family/patient expects to be discharged to:: Private residence Living Arrangements: Alone (wife is SNF at [facility]) Available Help at Discharge:  (family can arrange 24/7 care, but anticipated SNF [facility] then ALF  after CIR) Type of Home: Independent living facility Home Access: Level entry Home Layout: One level Bathroom Shower/Tub: Health Visitor: Standard Bathroom Accessibility: Yes Home Equipment: Agricultural Consultant (2 wheels)  Lives With: Alone   Functional History: Prior Function Prior Level of Function : Independent/Modified Independent, Driving Mobility Comments: pt reports ind with mobility ADLs Comments: active, ind with ADLs, lives at ind living facility  Functional Status:  Mobility: Bed  Mobility Overal bed mobility: Needs Assistance Bed Mobility: Sidelying to Sit, Sit to Sidelying, Rolling Rolling: Mod assist Sidelying to sit: Min assist Sit to sidelying: Min assist General bed mobility comments: Pt in recliner on arrival. Transfers Overall transfer level: Needs assistance Equipment used: Rolling walker (2 wheels) Transfers: Sit to/from Stand Sit to Stand: Min assist, Mod assist General transfer comment: min assist from recliner. Mod assist from low toilet. Ambulation/Gait Ambulation/Gait assistance: Min assist, +2 safety/equipment Gait Distance (Feet): 165 Feet Assistive device: Rolling walker (2 wheels) Gait Pattern/deviations: Step-through pattern, Decreased stride length General Gait Details: Assist for balance. Attempted to assess amb without AD but pt unable to take steps without BUE support. +2 for chair follow Gait velocity: decreased Gait velocity interpretation: <1.31 ft/sec, indicative of household ambulator    ADL:    Cognition: Cognition Orientation Level: Oriented to person, Oriented to place, Disoriented to time, Oriented to situation Cognition Arousal: Alert Behavior During Therapy: [facility] for tasks assessed/performed, Flat affect   Blood pressure 131/69, pulse 64, temperature 97.8 F (36.6 C), temperature source Oral, resp. rate 16, height (P) 6' 2.5 (1.892 m), weight (P) 81.6 kg, SpO2 98%. Physical Exam Vitals and nursing note reviewed.  Constitutional:      Appearance: Normal appearance.     Comments: Abrasions on forehead and nose.   Neurological:     Mental Status: He is alert.     Results for orders placed or performed during the hospital encounter of 06/13/24 (from the past 48 hours)  CBC     Status: Abnormal   Collection Time: 06/15/24  5:16 AM  Result Value Ref Range   WBC 17.6 (H) 4.0 - 10.5 K/uL   RBC 4.14 (L) 4.22 - 5.81 MIL/uL   Hemoglobin 13.5 13.0 - 17.0 g/dL   HCT 60.8 60.9 - 47.9 %   MCV 94.4 80.0 - 100.0 fL   MCH  32.6 26.0 - 34.0 pg   MCHC 34.5 30.0 - 36.0 g/dL   RDW 86.7 88.4 - 84.4 %   Platelets 203 150 - 400 K/uL   nRBC 0.0 0.0 - 0.2 %    Comment: Performed at [facility], [address] [place], [place] [address]  Comprehensive metabolic panel     Status: Abnormal   Collection Time: 06/15/24  5:16 AM  Result Value Ref Range   Sodium 132 (L) 135 - 145 mmol/L   Potassium 4.3 3.5 - 5.1 mmol/L   Chloride 98 98 - 111 mmol/L   CO2 22 22 - 32 mmol/L   Glucose, Bld 123 (H) 70 - 99 mg/dL    Comment: Glucose reference range applies only to samples taken after fasting for at least 8 hours.   BUN 8 8 - 23 mg/dL   Creatinine, Ser 9.15 0.61 - 1.24 mg/dL   Calcium 8.3 (L) 8.9 - 10.3 mg/dL   Total Protein 6.1 (L) 6.5 - 8.1 g/dL   Albumin 2.9 (L) 3.5 - 5.0 g/dL   AST 27 15 - 41 U/L   ALT 12 0 - 44  U/L   Alkaline Phosphatase 95 38 - 126 U/L   Total Bilirubin 1.3 (H) 0.0 - 1.2 mg/dL   GFR, Estimated >39 >39 mL/min    Comment: (NOTE) Calculated using the CKD-EPI Creatinine Equation (2021)    Anion gap 12 5 - 15    Comment: Performed at [facility], [address], [place], [place] [address]  Glucose, capillary     Status: Abnormal   Collection Time: 06/15/24  2:24 PM  Result Value Ref Range   Glucose-Capillary 129 (H) 70 - 99 mg/dL    Comment: Glucose reference range applies only to samples taken after fasting for at least 8 hours.  CBC     Status: Abnormal   Collection Time: 06/16/24  4:37 AM  Result Value Ref Range   WBC 15.9 (H) 4.0 - 10.5 K/uL   RBC 3.76 (L) 4.22 - 5.81 MIL/uL   Hemoglobin 12.2 (L) 13.0 - 17.0 g/dL   HCT 64.5 (L) 60.9 - 47.9 %   MCV 94.1 80.0 - 100.0 fL   MCH 32.4 26.0 - 34.0 pg   MCHC 34.5 30.0 - 36.0 g/dL   RDW 86.9 88.4 - 84.4 %   Platelets 214 150 - 400 K/uL   nRBC 0.0 0.0 - 0.2 %    Comment: Performed at [facility], [address], [place], [place] [address]  Basic metabolic panel     Status: Abnormal   Collection Time: 06/16/24  4:37 AM   Result Value Ref Range   Sodium 128 (L) 135 - 145 mmol/L   Potassium 3.7 3.5 - 5.1 mmol/L   Chloride 96 (L) 98 - 111 mmol/L   CO2 22 22 - 32 mmol/L   Glucose, Bld 108 (H) 70 - 99 mg/dL    Comment: Glucose reference range applies only to samples taken after fasting for at least 8 hours.   BUN 10 8 - 23 mg/dL   Creatinine, Ser 9.13 0.61 - 1.24 mg/dL   Calcium 8.1 (L) 8.9 - 10.3 mg/dL   GFR, Estimated >39 >39 mL/min    Comment: (NOTE) Calculated using the CKD-EPI Creatinine Equation (2021)    Anion gap 10 5 - 15    Comment: Performed at [facility], [address], [place], [place] [address]   ECHOCARDIOGRAM COMPLETE Result Date: 06/16/2024    ECHOCARDIOGRAM REPORT   Patient Name:   LAURENT CARGILE Date of Exam: 06/16/2024 Medical Rec #:  [id number]   Height:       74.5 in Accession #:    [id number]  Weight:       180.0 lb Date of Birth:  Sep 17, 1933   BSA:          2.088 m Patient Age:    89 years    BP:           129/63 mmHg Patient Gender: M           HR:           70 bpm. Exam Location:  Inpatient Procedure: 2D Echo, Cardiac Doppler and Color Doppler (Both Spectral and Color            Flow Doppler were utilized during procedure). Indications:    Syncope  History:        Patient has no prior history of Echocardiogram examinations.                 COPD; Risk Factors:Dyslipidemia.  Sonographer:    Sherlean  Nori Referring Phys: MILBERT SABAS RAMAN LAMA  Sonographer Comments: Image acquisition challenging due to respiratory motion. IMPRESSIONS  1. Left ventricular ejection fraction, by estimation, is 60 to 65%. The left ventricle has normal function. The left ventricle has no regional wall motion abnormalities. Left ventricular diastolic parameters were normal.  2. Right ventricular systolic function is normal. The right ventricular size is normal.  3. The mitral valve is normal in structure. No evidence of mitral valve regurgitation. No evidence of mitral stenosis.  4. The aortic valve is tricuspid.  There is mild calcification of the aortic valve. There is mild thickening of the aortic valve. Aortic valve regurgitation is not visualized. Aortic valve sclerosis is present, with no evidence of aortic valve stenosis.  5. The inferior vena cava is normal in size with greater than 50% respiratory variability, suggesting right atrial pressure of 3 mmHg. FINDINGS  Left Ventricle: Left ventricular ejection fraction, by estimation, is 60 to 65%. The left ventricle has normal function. The left ventricle has no regional wall motion abnormalities. Strain was performed and the global longitudinal strain is indeterminate. The left ventricular internal cavity size was normal in size. There is no left ventricular hypertrophy. Left ventricular diastolic parameters were normal. Right Ventricle: The right ventricular size is normal. No increase in right ventricular wall thickness. Right ventricular systolic function is normal. Left Atrium: Left atrial size was normal in size. Right Atrium: Right atrial size was normal in size. Pericardium: There is no evidence of pericardial effusion. Mitral Valve: The mitral valve is normal in structure. No evidence of mitral valve regurgitation. No evidence of mitral valve stenosis. Tricuspid Valve: The tricuspid valve is normal in structure. Tricuspid valve regurgitation is not demonstrated. No evidence of tricuspid stenosis. Aortic Valve: The aortic valve is tricuspid. There is mild calcification of the aortic valve. There is mild thickening of the aortic valve. Aortic valve regurgitation is not visualized. Aortic valve sclerosis is present, with no evidence of aortic valve stenosis. Pulmonic Valve: The pulmonic valve was normal in structure. Pulmonic valve regurgitation is not visualized. No evidence of pulmonic stenosis. Aorta: The aortic root is normal in size and structure. Venous: The inferior vena cava is normal in size with greater than 50% respiratory variability, suggesting right  atrial pressure of 3 mmHg. IAS/Shunts: No atrial level shunt detected by color flow Doppler. Additional Comments: 3D was performed not requiring image post processing on an independent workstation and was indeterminate.  LEFT VENTRICLE PLAX 2D LVIDd:         4.00 cm   Diastology LVIDs:         2.70 cm   LV e' medial:    10.00 cm/s LV PW:         1.00 cm   LV E/e' medial:  5.9 LV IVS:        0.90 cm   LV e' lateral:   12.70 cm/s LVOT diam:     2.30 cm   LV E/e' lateral: 4.6 LV SV:         84 LV SV Index:   40 LVOT Area:     4.15 cm  RIGHT VENTRICLE             IVC RV Basal diam:  3.50 cm     IVC diam: 1.60 cm RV Mid diam:    2.50 cm RV S prime:     11.60 cm/s TAPSE (M-mode): 1.8 cm LEFT ATRIUM  Index        RIGHT ATRIUM           Index LA diam:      2.50 cm 1.20 cm/m   RA Area:     13.60 cm LA Vol (A2C): 27.2 ml 13.03 ml/m  RA Volume:   26.40 ml  12.64 ml/m LA Vol (A4C): 26.5 ml 12.69 ml/m  AORTIC VALVE LVOT Vmax:   92.80 cm/s LVOT Vmean:  60.800 cm/s LVOT VTI:    0.201 m  AORTA Ao Root diam: 2.40 cm MITRAL VALVE MV Area (PHT): 2.72 cm    SHUNTS MV Decel Time: 279 msec    Systemic VTI:  0.20 m MV E velocity: 58.70 cm/s  Systemic Diam: 2.30 cm MV A velocity: 71.80 cm/s MV E/A ratio:  0.82 Maude Emmer MD Electronically signed by Maude Emmer MD Signature Date/Time: 06/16/2024/2:10:37 PM    Final    DG Cervical Spine 2 or 3 views Result Date: 06/15/2024 EXAM: 2 or 3 VIEW(S) XRAY OF THE CERVICAL SPINE 06/15/2024 11:34:00 AM COMPARISON: Comparison 06/13/2024. CLINICAL HISTORY: Postoperative for C2 fracture comparison 06/13/2024. FINDINGS: BONES: Posterolateral rod and screw fixation observed at C1-C2 with marked improvement in displacement of the prior fracture, currently 2 mm posterior displacement of the odontoid with respect to the C2 vertebral body, previously 10 mm. No complicating feature observed. Interbody and posterior element fusion at C2-C3. Fused facet joints bilaterally at C4-C5. Bony  demineralization. Alignment is normal. No aggressive appearing osseous lesion. DISCS AND DEGENERATIVE CHANGES: Cervical spondylosis is present. SOFT TISSUES: Expected gas in the soft tissues. No prevertebral soft tissue swelling. The visualized lungs appear clear. IMPRESSION: 1. Marked improvement in displacement of the prior C2 fracture with posterolateral rod and screw fixation at C1-2, with 2 mm posterior displacement of the odontoid relative to the C2 vertebral body, previously 10 mm, without complicating features. 2. Interbody and posterior element fusion at C2-3. 3. Cervical spondylosis. 4. Bony demineralization. 5. Fused facet joints bilaterally at C4-5. Electronically signed by: Ryan Salvage MD 06/15/2024 03:36 PM EST RP Workstation: [id number]   DG Cervical Spine 2 or 3 views Result Date: 06/14/2024 EXAM: 3 FLUOROSCOPIC INTRAOPERATIVE VIEW(S) XRAY OF THE CERVICAL SPINE 06/14/2024 06:32:00 PM COMPARISON: Prior CT examination of 06/13/2024. CLINICAL HISTORY: [id number] Surgery, elective [id number]. Surgery, elective [id number]. FINDINGS: BONES: Intraoperative posterior fusion with instrumentation of C1 and C2 with bilateral laminar screws and posterior bars. Dense fractures are again identified demonstrating 2 cortical width posterior displacement, with decreased displacement when compared to prior CT examination of 06/13/2024. Alignment is altered by the fracture and instrumentation. No aggressive appearing osseous lesion. DISCS AND DEGENERATIVE CHANGES: No severe degenerative changes. SOFT TISSUES: Prevertebral soft tissue swelling again noted. The visualized lungs appear clear. FLUOROSCOPY: Fluoroscopy time: 9.1 seconds. Fluoroscopic dose: 1.6 mg. IMPRESSION: 1. Intraoperative posterior fusion with instrumentation of C1 and C2 with bilateral laminar screws and posterior bars. 2. Dens fractures with 2 cortical width posterior displacement, decreased compared to prior CT examination of 06/13/24. 3. Prevertebral  soft tissue swelling. Electronically signed by: Dorethia Molt MD 06/14/2024 11:44 PM EST RP Workstation: [id number]   DG C-Arm 1-60 Min-No Report Result Date: 06/14/2024 Fluoroscopy was utilized by the requesting physician.  No radiographic interpretation.   DG C-Arm 1-60 Min-No Report Result Date: 06/14/2024 Fluoroscopy was utilized by the requesting physician.  No radiographic interpretation.   DG C-Arm 1-60 Min-No Report Result Date: 06/14/2024 Fluoroscopy was utilized by the requesting physician.  No radiographic interpretation.   DG O-ARM  IMAGE ONLY/NO REPORT Result Date: 06/14/2024 There is no Radiologist interpretation  for this exam.     Blood pressure 131/69, pulse 64, temperature 97.8 F (36.6 C), temperature source Oral, resp. rate 16, height (P) 6' 2.5 (1.892 m), weight (P) 81.6 kg, SpO2 98%.  Medical Problem List and Plan: 1. Functional deficits secondary to ***  -patient may *** shower  -ELOS/Goals: *** 2.  Antithrombotics: -DVT/anticoagulation:  Mechanical: Sequential compression devices, below knee Bilateral lower extremities  -antiplatelet therapy: N/A 3. Pain Management: Oxycodone  prn. Tylenol  prn 4. Mood/Behavior/Sleep: LCSW to follow for evaluation and support  --Melatonin prn for sleep.   -antipsychotic agents: N/A 5. Neuropsych/cognition: This patient appears to be capable of making decisions on his own behalf. 6. Skin/Wound Care: routine pressure relief measures 7. Fluids/Electrolytes/Nutrition: Monitor I/O. Check CMET in am. 8. Chronic rhinitis/allergies: Has chronic cough a/w this --continue Flonase .   9. BPH: Managed with Proscar  10. Alzheimer's dementia: Per family decline over  past couple of years. Tends to repeat himself  --very HOH. Dry erase board for communication needs.   11. Acute on chronic hyponatremia: Baseline Na-132-->128  --Question SIADH v/s cerebral salt wasting.   ***  Sharlet GORMAN Schmitz, PA-C 06/16/2024

## 2024-06-16 NOTE — Progress Notes (Addendum)
 [facility]  PROGRESS NOTE  Adrian Ray FMW:[id number] DOB: October 31, 1933 DOA: 06/13/2024 PCP: Ransom Other, MD   Brief HPI:    89 y.o. male with medical history significant for sensorineural hearing loss, hyperlipidemia, COPD, who is admitted to [facility] on 06/13/2024 with unstable acute displaced C2 dens fracture after presenting from assisted living facility to [facility] ED complaining of fall.  The patient was ambulating on the sidewalk at his assisted living facility, where he is reported to live independently, when he reports that he tripped resulting in a fall straight forward, with the anterior aspect of his face serving as the principal point of contact with a cement.  He denies any associated loss of consciousness    Assessment/Plan:   Unstable acute displaced C2 dens fracture, status post posterior C1-2 fusion - S/p mechanical fall, no loss of consciousness -CT head showed no acute intracranial process - Neurosurgery consulted; underwent posterior C1-2 fusion on 11/19 -Neurosurgery has cleared for discharge - PT evaluation obtained; patient is stable for discharge to CIR  Status post fall/?  Syncope - Patient denies loss of consciousness - Poor historian, also has dementia - Echocardiogram obtained today showed EF of 60-to 65%, no regional wall motion abnormalities.  No valvular abnormality - Continue monitoring on telemetry - Will check orthostatic vital signs   Leukocytosis - WBC count 14,000 on admission; WBC is up to 17,000 - UA is clear - Has been afebrile, normal vital signs - Likely reactive -WBC is 15.9 today   BPH - Tamsulosin on hold - Will restart Proscar   Interstitial lung disease - Stable, not requiring oxygen  - Continue Breo elliptica as needed  Hyperlipidemia - Continue to hold statin  Hyponatremia - Chronic, sodium is 128 - Start fluid restriction 1.5 L - Follow BMP in am  History of dementia - Aricept on hold - Delirium  precautions    DVT prophylaxis: SCDs  Medications     finasteride   5 mg Oral Daily   fluticasone   2 spray Each Nare Daily   fluticasone  furoate-vilanterol  1 puff Inhalation Daily   neomycin -bacitracin -polymyxin  1 Application Apply externally BID     Data Reviewed:   CBG:  Recent Labs  Lab 06/15/24 1424  GLUCAP 129*    SpO2: 97 %    Vitals:   06/15/24 1804 06/15/24 2011 06/16/24 0409 06/16/24 0804  BP: (!) 133/59 (!) 135/58 127/60   Pulse: 82 79 72 70  Resp: 16  17 18   Temp: 97.7 F (36.5 C) 98.5 F (36.9 C) 97.8 F (36.6 C)   TempSrc: Oral Oral    SpO2: 97% 98% 97%   Weight:      Height:          Data Reviewed:  Basic Metabolic Panel: Recent Labs  Lab 06/13/24 1848 06/14/24 0404 06/15/24 0516 06/16/24 0437  NA 133* 132* 132* 128*  K 3.7 4.5 4.3 3.7  CL 100 98 98 96*  CO2 23 23 22 22   GLUCOSE 128* 131* 123* 108*  BUN 10 10 8 10   CREATININE 0.79 0.89 0.84 0.86  CALCIUM 7.9* 8.4* 8.3* 8.1*  MG  --  2.1  --   --     CBC: Recent Labs  Lab 06/13/24 1848 06/14/24 0404 06/15/24 0516 06/16/24 0437  WBC 11.5* 14.8* 17.6* 15.9*  NEUTROABS 8.9* 12.3*  --   --   HGB 13.2 13.2 13.5 12.2*  HCT 39.7 39.0 39.1 35.4*  MCV 96.6 96.3 94.4 94.1  PLT  235 236 203 214    LFT Recent Labs  Lab 06/13/24 1848 06/14/24 0404 06/15/24 0516  AST 22 20 27   ALT 12 12 12   ALKPHOS 94 97 95  BILITOT 0.6 0.7 1.3*  PROT 6.2* 6.8 6.1*  ALBUMIN 3.1* 3.2* 2.9*     Antibiotics: Anti-infectives (From admission, onward)    Start     Dose/Rate Route Frequency Ordered Stop   06/14/24 0703  ceFAZolin  (ANCEF ) IVPB 2g/100 mL premix        2 g 200 mL/hr over 30 Minutes Intravenous 30 min pre-op 06/14/24 0703 06/14/24 1638        CONSULTS neurosurgery  Code Status: Full code  Family Communication: Discussed with patient's daughter at bedside     Subjective   Patient seen and examined, complains of soreness in the neck.  Objective    Physical  Examination:  General-appears in no acute distress Heart-S1-S2, regular, no murmur auscultated Lungs-clear to auscultation bilaterally, no wheezing or crackles auscultated Abdomen-soft, nontender, no organomegaly Extremities-no edema in the lower extremities Neuro-alert, oriented x3, no focal deficit noted   Status is: Inpatient:             Adrian Ray   [facility] If 7PM-7AM, please contact night-coverage at [email or web address], Office  [phone]   06/16/2024, 8:19 AM  LOS: 3 days

## 2024-06-16 NOTE — Progress Notes (Addendum)
 Discussed CODE STATUS with patient's daughter Geni and Kreg at bedside.  Patient is DNR.  They do not want chest compressions or intubation.  But want him to be on BiPAP in case he needs for respiratory distress/respiratory failure

## 2024-06-16 NOTE — Progress Notes (Addendum)
  Inpatient Rehabilitation Admissions Coordinator   Met with patient and caregiver at bedside for rehab assessment.  I contacted his daughter, Geni, by phone per her request. Prior to admit patient active and living in ILF at [facility]. His wife is in SNF long term at [facility]. Patient very independent and active until 6 weeks ago after MCA. Family would like 5 to 7 days of AIR level rehab and then progress to [facility] prior to final ALF at [facility]. They have spoken to the DON at [facility] to begin those arrangements. They can arrange 24/7 caregiver supports as needed at ALF/ILF.   I will verify bed availability over the weekend and first of week to verify when bed would be available. Feel he could benefit from Short term CIR prior to return to [facility].Please call me with any questions.   Heron Leavell, RN, MSN Rehab Admissions Coordinator [phone]    CIR bed is available to admit Saturday and Geni is aware. I will make the arrangements. Dr Carilyn will round in the am and 5 n nurse can call [phone] at noon Saturday to clarify when bed will be available to admit and give report.  Heron Leavell, RN, MSN Rehab Admissions Coordinator [phone] 06/16/2024 2:32 PM

## 2024-06-16 NOTE — Care Management Important Message (Signed)
 Important Message  Patient Details  Name: Adrian Ray MRN: [id number] Date of Birth: 1934/07/26   Important Message Given:  Yes - Medicare IM     [name] 06/16/2024, 1:02 PM

## 2024-06-17 ENCOUNTER — Encounter (HOSPITAL_COMMUNITY): Payer: Self-pay | Admitting: Physical Medicine & Rehabilitation

## 2024-06-17 ENCOUNTER — Other Ambulatory Visit: Payer: Self-pay | Admitting: Physical Medicine & Rehabilitation

## 2024-06-17 ENCOUNTER — Other Ambulatory Visit: Payer: Self-pay

## 2024-06-17 ENCOUNTER — Inpatient Hospital Stay (HOSPITAL_COMMUNITY)
Admission: EM | Admit: 2024-06-17 | Discharge: 2024-06-26 | DRG: 560 | Disposition: A | Discharge status: Skilled Nursing Facility | Source: Intra-hospital | Attending: Physical Medicine & Rehabilitation | Admitting: Physical Medicine & Rehabilitation

## 2024-06-17 ENCOUNTER — Encounter: Payer: Self-pay | Admitting: Family Medicine

## 2024-06-17 DIAGNOSIS — D72829 Elevated white blood cell count, unspecified: Secondary | ICD-10-CM | POA: Diagnosis present

## 2024-06-17 DIAGNOSIS — J310 Chronic rhinitis: Secondary | ICD-10-CM | POA: Diagnosis present

## 2024-06-17 DIAGNOSIS — N400 Enlarged prostate without lower urinary tract symptoms: Secondary | ICD-10-CM | POA: Diagnosis present

## 2024-06-17 DIAGNOSIS — Z885 Allergy status to narcotic agent status: Secondary | ICD-10-CM

## 2024-06-17 DIAGNOSIS — K5901 Slow transit constipation: Secondary | ICD-10-CM | POA: Diagnosis not present

## 2024-06-17 DIAGNOSIS — E871 Hypo-osmolality and hyponatremia: Secondary | ICD-10-CM | POA: Insufficient documentation

## 2024-06-17 DIAGNOSIS — Z881 Allergy status to other antibiotic agents status: Secondary | ICD-10-CM | POA: Diagnosis not present

## 2024-06-17 DIAGNOSIS — S022XXA Fracture of nasal bones, initial encounter for closed fracture: Secondary | ICD-10-CM | POA: Diagnosis not present

## 2024-06-17 DIAGNOSIS — S12100A Unspecified displaced fracture of second cervical vertebra, initial encounter for closed fracture: Secondary | ICD-10-CM | POA: Diagnosis not present

## 2024-06-17 DIAGNOSIS — K5900 Constipation, unspecified: Secondary | ICD-10-CM | POA: Diagnosis present

## 2024-06-17 DIAGNOSIS — Z79899 Other long term (current) drug therapy: Secondary | ICD-10-CM

## 2024-06-17 DIAGNOSIS — M532X1 Spinal instabilities, occipito-atlanto-axial region: Secondary | ICD-10-CM | POA: Diagnosis not present

## 2024-06-17 DIAGNOSIS — M25519 Pain in unspecified shoulder: Secondary | ICD-10-CM | POA: Diagnosis present

## 2024-06-17 DIAGNOSIS — Z87891 Personal history of nicotine dependence: Secondary | ICD-10-CM

## 2024-06-17 DIAGNOSIS — S12110D Anterior displaced Type II dens fracture, subsequent encounter for fracture with routine healing: Secondary | ICD-10-CM | POA: Diagnosis not present

## 2024-06-17 DIAGNOSIS — Z981 Arthrodesis status: Secondary | ICD-10-CM | POA: Diagnosis not present

## 2024-06-17 DIAGNOSIS — Y9301 Activity, walking, marching and hiking: Secondary | ICD-10-CM | POA: Diagnosis present

## 2024-06-17 DIAGNOSIS — Z9049 Acquired absence of other specified parts of digestive tract: Secondary | ICD-10-CM

## 2024-06-17 DIAGNOSIS — R5381 Other malaise: Secondary | ICD-10-CM | POA: Diagnosis present

## 2024-06-17 DIAGNOSIS — S12120S Other displaced dens fracture, sequela: Secondary | ICD-10-CM | POA: Diagnosis not present

## 2024-06-17 DIAGNOSIS — R519 Headache, unspecified: Secondary | ICD-10-CM | POA: Diagnosis not present

## 2024-06-17 DIAGNOSIS — W010XXD Fall on same level from slipping, tripping and stumbling without subsequent striking against object, subsequent encounter: Secondary | ICD-10-CM | POA: Diagnosis present

## 2024-06-17 DIAGNOSIS — E785 Hyperlipidemia, unspecified: Secondary | ICD-10-CM | POA: Diagnosis present

## 2024-06-17 DIAGNOSIS — G301 Alzheimer's disease with late onset: Secondary | ICD-10-CM | POA: Diagnosis not present

## 2024-06-17 DIAGNOSIS — H903 Sensorineural hearing loss, bilateral: Secondary | ICD-10-CM | POA: Diagnosis present

## 2024-06-17 DIAGNOSIS — F0280 Dementia in other diseases classified elsewhere without behavioral disturbance: Secondary | ICD-10-CM | POA: Diagnosis present

## 2024-06-17 DIAGNOSIS — J449 Chronic obstructive pulmonary disease, unspecified: Secondary | ICD-10-CM | POA: Diagnosis present

## 2024-06-17 DIAGNOSIS — Z91013 Allergy to seafood: Secondary | ICD-10-CM | POA: Diagnosis not present

## 2024-06-17 DIAGNOSIS — F02A0 Dementia in other diseases classified elsewhere, mild, without behavioral disturbance, psychotic disturbance, mood disturbance, and anxiety: Secondary | ICD-10-CM | POA: Diagnosis not present

## 2024-06-17 DIAGNOSIS — J984 Other disorders of lung: Secondary | ICD-10-CM | POA: Diagnosis not present

## 2024-06-17 DIAGNOSIS — S12100B Unspecified displaced fracture of second cervical vertebra, initial encounter for open fracture: Secondary | ICD-10-CM | POA: Diagnosis not present

## 2024-06-17 DIAGNOSIS — K219 Gastro-esophageal reflux disease without esophagitis: Secondary | ICD-10-CM | POA: Diagnosis present

## 2024-06-17 DIAGNOSIS — G309 Alzheimer's disease, unspecified: Secondary | ICD-10-CM | POA: Diagnosis present

## 2024-06-17 DIAGNOSIS — N401 Enlarged prostate with lower urinary tract symptoms: Secondary | ICD-10-CM | POA: Diagnosis not present

## 2024-06-17 DIAGNOSIS — M1710 Unilateral primary osteoarthritis, unspecified knee: Secondary | ICD-10-CM | POA: Diagnosis present

## 2024-06-17 DIAGNOSIS — S022XXS Fracture of nasal bones, sequela: Secondary | ICD-10-CM | POA: Diagnosis not present

## 2024-06-17 DIAGNOSIS — E782 Mixed hyperlipidemia: Secondary | ICD-10-CM | POA: Diagnosis not present

## 2024-06-17 DIAGNOSIS — F02B0 Dementia in other diseases classified elsewhere, moderate, without behavioral disturbance, psychotic disturbance, mood disturbance, and anxiety: Secondary | ICD-10-CM | POA: Diagnosis not present

## 2024-06-17 DIAGNOSIS — S12100D Unspecified displaced fracture of second cervical vertebra, subsequent encounter for fracture with routine healing: Secondary | ICD-10-CM | POA: Diagnosis not present

## 2024-06-17 DIAGNOSIS — S12110A Anterior displaced Type II dens fracture, initial encounter for closed fracture: Principal | ICD-10-CM | POA: Diagnosis present

## 2024-06-17 DIAGNOSIS — M179 Osteoarthritis of knee, unspecified: Secondary | ICD-10-CM | POA: Diagnosis present

## 2024-06-17 LAB — BPAM RBC
Blood Product Expiration Date: 202512122359
Blood Product Expiration Date: 202512122359
Unit Type and Rh: 6200
Unit Type and Rh: 6200

## 2024-06-17 LAB — TYPE AND SCREEN
ABO/RH(D): A POS
Antibody Screen: NEGATIVE
Unit division: 0
Unit division: 0

## 2024-06-17 LAB — CBC
HCT: 37.8 % — ABNORMAL LOW (ref 39.0–52.0)
Hemoglobin: 13.1 g/dL (ref 13.0–17.0)
MCH: 32.5 pg (ref 26.0–34.0)
MCHC: 34.7 g/dL (ref 30.0–36.0)
MCV: 93.8 fL (ref 80.0–100.0)
Platelets: 208 K/uL (ref 150–400)
RBC: 4.03 MIL/uL — ABNORMAL LOW (ref 4.22–5.81)
RDW: 13 % (ref 11.5–15.5)
WBC: 10.8 K/uL — ABNORMAL HIGH (ref 4.0–10.5)
nRBC: 0 % (ref 0.0–0.2)

## 2024-06-17 LAB — BASIC METABOLIC PANEL WITH GFR
Anion gap: 11 (ref 5–15)
BUN: 10 mg/dL (ref 8–23)
CO2: 20 mmol/L — ABNORMAL LOW (ref 22–32)
Calcium: 8.4 mg/dL — ABNORMAL LOW (ref 8.9–10.3)
Chloride: 98 mmol/L (ref 98–111)
Creatinine, Ser: 0.63 mg/dL (ref 0.61–1.24)
GFR, Estimated: >60 mL/min (ref >60)
Glucose, Bld: 102 mg/dL — ABNORMAL HIGH (ref 70–99)
Potassium: 3.9 mmol/L (ref 3.5–5.1)
Sodium: 129 mmol/L — ABNORMAL LOW (ref 135–145)

## 2024-06-17 MED ORDER — GUAIFENESIN-DM 100-10 MG/5ML PO SYRP: 5.0000 mL | ORAL_SOLUTION | Freq: Four times a day (QID) | ORAL | Status: DC | PRN

## 2024-06-17 MED ORDER — BACITRACIN-NEOMYCIN-POLYMYXIN OINTMENT TUBE
TOPICAL_OINTMENT | Freq: Two times a day (BID) | CUTANEOUS | Status: DC
  Filled 2024-06-17 (×2): qty 14

## 2024-06-17 MED ORDER — OXYCODONE HCL 5 MG PO TABS
2.5000 mg | ORAL_TABLET | ORAL | Status: DC | PRN
  Administered 2024-06-18 – 2024-06-24 (×5): 5 mg via ORAL
  Filled 2024-06-17 (×7): qty 1

## 2024-06-17 MED ORDER — DIPHENHYDRAMINE HCL 25 MG PO CAPS
25.0000 mg | ORAL_CAPSULE | Freq: Four times a day (QID) | ORAL | Status: DC | PRN
  Administered 2024-06-18: 25 mg via ORAL
  Filled 2024-06-17: qty 1

## 2024-06-17 MED ORDER — OXYCODONE HCL 5 MG PO TABS: 2.5000 mg | ORAL_TABLET | ORAL | Status: DC | PRN

## 2024-06-17 MED ORDER — FLEET ENEMA RE ENEM
1.0000 | ENEMA | Freq: Once | RECTAL | Status: DC | PRN
  Filled 2024-06-17: qty 1

## 2024-06-17 MED ORDER — CYCLOBENZAPRINE HCL 5 MG PO TABS: 5.0000 mg | ORAL_TABLET | Freq: Three times a day (TID) | ORAL | Status: DC | PRN

## 2024-06-17 MED ORDER — ALUM & MAG HYDROXIDE-SIMETH 200-200-20 MG/5ML PO SUSP: 30.0000 mL | ORAL | Status: DC | PRN

## 2024-06-17 MED ORDER — PROCHLORPERAZINE 25 MG RE SUPP: 12.5000 mg | Freq: Four times a day (QID) | RECTAL | Status: DC | PRN

## 2024-06-17 MED ORDER — BISACODYL 10 MG RE SUPP: 10.0000 mg | Freq: Every day | RECTAL | Status: DC | PRN

## 2024-06-17 MED ORDER — FLUTICASONE PROPIONATE 50 MCG/ACT NA SUSP
2.0000 | Freq: Every day | NASAL | Status: DC
  Administered 2024-06-19 – 2024-06-26 (×8): 2 via NASAL
  Filled 2024-06-17 (×2): qty 16

## 2024-06-17 MED ORDER — BACITRACIN-NEOMYCIN-POLYMYXIN OINTMENT TUBE: 1.0000 | TOPICAL_OINTMENT | Freq: Two times a day (BID) | CUTANEOUS | Status: DC

## 2024-06-17 MED ORDER — PROCHLORPERAZINE EDISYLATE 10 MG/2ML IJ SOLN: 5.0000 mg | Freq: Four times a day (QID) | INTRAMUSCULAR | Status: DC | PRN

## 2024-06-17 MED ORDER — FLUTICASONE FUROATE-VILANTEROL 100-25 MCG/ACT IN AEPB: 1.0000 | INHALATION_SPRAY | Freq: Every day | RESPIRATORY_TRACT | Status: DC

## 2024-06-17 MED ORDER — NALOXONE HCL 0.4 MG/ML IJ SOLN: 0.4000 mg | INTRAMUSCULAR | Status: DC | PRN

## 2024-06-17 MED ORDER — MELATONIN 5 MG PO TABS
5.0000 mg | ORAL_TABLET | Freq: Every evening | ORAL | Status: DC | PRN
  Administered 2024-06-18 – 2024-06-20 (×4): 5 mg via ORAL
  Filled 2024-06-17 (×4): qty 1

## 2024-06-17 MED ORDER — FINASTERIDE 5 MG PO TABS
5.0000 mg | ORAL_TABLET | Freq: Every day | ORAL | Status: DC
  Administered 2024-06-18 – 2024-06-26 (×9): 5 mg via ORAL
  Filled 2024-06-17 (×10): qty 1

## 2024-06-17 MED ORDER — ACETAMINOPHEN 325 MG PO TABS
325.0000 mg | ORAL_TABLET | ORAL | Status: DC | PRN
  Administered 2024-06-20: 650 mg via ORAL
  Filled 2024-06-17: qty 2

## 2024-06-17 MED ORDER — FLUTICASONE FUROATE-VILANTEROL 100-25 MCG/ACT IN AEPB
1.0000 | INHALATION_SPRAY | Freq: Every day | RESPIRATORY_TRACT | Status: DC
  Administered 2024-06-19 – 2024-06-26 (×7): 1 via RESPIRATORY_TRACT
  Filled 2024-06-17: qty 28

## 2024-06-17 MED ORDER — VITAMIN B-12 1000 MCG PO TABS
500.0000 ug | ORAL_TABLET | Freq: Every day | ORAL | Status: DC
  Administered 2024-06-17 – 2024-06-26 (×10): 500 ug via ORAL
  Filled 2024-06-17 (×11): qty 1

## 2024-06-17 MED ORDER — CYCLOBENZAPRINE HCL 5 MG PO TABS
5.0000 mg | ORAL_TABLET | Freq: Three times a day (TID) | ORAL | Status: DC | PRN
  Administered 2024-06-18 – 2024-06-23 (×5): 5 mg via ORAL
  Filled 2024-06-17 (×6): qty 1

## 2024-06-17 MED ORDER — PROCHLORPERAZINE MALEATE 5 MG PO TABS: 5.0000 mg | ORAL_TABLET | Freq: Four times a day (QID) | ORAL | Status: DC | PRN

## 2024-06-17 MED ORDER — ALBUTEROL SULFATE (2.5 MG/3ML) 0.083% IN NEBU: 2.5000 mg | INHALATION_SOLUTION | RESPIRATORY_TRACT | Status: DC | PRN

## 2024-06-17 NOTE — Progress Notes (Signed)
 Mobility Specialist Progress Note:    06/17/24 1040  Mobility  Activity Turned to back - supine  Level of Assistance Standby assist, set-up cues, supervision of patient - no hands on  Assistive Device None  Range of Motion/Exercises Left leg;Right leg;Active  Activity Response Tolerated well  Mobility Referral Yes  Mobility visit 1 Mobility  Mobility Specialist Start Time (ACUTE ONLY) 1040  Mobility Specialist Stop Time (ACUTE ONLY) 1055  Mobility Specialist Time Calculation (min) (ACUTE ONLY) 15 min   Received pt laying in bed agreeable to session. No c/o any symptoms. Pt hard of hearing so needing to utilize white board in room. Pt tolerated bed exercises well before PT session. Able to perform movements w/ no assist. Left pt in bed w/ all needs met.   Venetia Keel Mobility Specialist Please Neurosurgeon or Rehab Office at [phone]

## 2024-06-17 NOTE — Plan of Care (Signed)

## 2024-06-17 NOTE — Progress Notes (Signed)
 Adrian Prentice BRAVO, MD  Physician Physical Medicine and Rehabilitation   PMR Pre-admission    Signed   Date of Service: 06/16/2024  3:15 PM  Related encounter: ED to Hosp-Admission (Discharged) from 06/13/2024 in [facility]   Signed     Expand All Collapse All  Show:Clear all [x] Written[x] Templated[] Copied  Added by: [x] Alison Heron MATSU, RN[x] Kirsteins, Prentice BRAVO, MD  [] Hover for details PMR Admission Coordinator Pre-Admission Assessment   Patient: Adrian Ray is an 89 y.o., male MRN: [id number] DOB: [birth year] Height: (P) 6' 2.5 (189.2 cm) Weight: (P) 81.6 kg   Insurance Information HMO:     PPO:      PCP:      IPA:      80/20:      OTHER:  PRIMARY: Medicare Part A and B      Policy#: [id number]      Subscriber: pt Benefits:  Phone #: passport one source online     Name: 11/21 Eff. Date: a 10/25/2000 and b 07/27/2010     Deduct: $1676      Out of Pocket Max: none      Life Max: none CIR: 100%      SNF: 20 full days Outpatient: 80%     Co-Pay: 20% Home Health: 100%      Co-Pay:  DME: 80%     Co-Pay: 20% Providers: pt choice  SECONDARY: [employer]      Policy#: [id number]     Phone#:    Artist:       Phone#:    The "Data Collection Information Summary" for patients in Inpatient Rehabilitation Facilities with attached "Privacy Act Statement-Health Care Records" was provided and verbally reviewed with: Family   Emergency Contact Information Contact Information       Name Relation Home Work Mobile    Dusseau,Erica Daughter     [phone]    Dorlene Maize Daughter [phone]   [phone]         Other Contacts   None on File      Current Medical History  Patient Admitting Diagnosis: Odontoid fx   History of Present Illness: 89 yo male with recent diagnosis of dementia ( independent living, adls, recent MCA 6 weeks ago) very HOH even with hearing aides, BPH, COPD, diverticulosis, HLD, and GERD  who presented on 06/13/24 with ground level fall, with neck hyperextension. CT showed posteriorly displaced odontoid fracture.    Neurosurgery consulted. Underwent C1-2 fusion on 11/19. Postop pain management and mobilization. Syncope workup negative. Orthostatics normal.Leukocytosis, afebrile and UA clear. Felt likely reactive. Chronic hyponatremia. Follow labs and fluid restriction. Delirium precautions and Aricept on hold.    Patient's medical record from [facility] has been reviewed by the rehabilitation admission coordinator and physician.   Past Medical History      Past Medical History:  Diagnosis Date   BPH (benign prostatic hyperplasia)     Cataracts, bilateral     Clavicle fracture      left   COPD (chronic obstructive pulmonary disease) (HCC)     Dementia (HCC)     GERD (gastroesophageal reflux disease)     Hard of hearing     Hyperlipemia          Has the patient had major surgery during 100 days prior to admission? Yes   Family History   family history is not on file.   Current Medications  Current Medications    Current Facility-Administered  Medications:    acetaminophen  (TYLENOL ) tablet 650 mg, 650 mg, Oral, Q6H PRN **OR** acetaminophen  (TYLENOL ) suppository 650 mg, 650 mg, Rectal, Q6H PRN, Howerter, Justin B, DO   albuterol  (PROVENTIL ) (2.5 MG/3ML) 0.083% nebulizer solution 2.5 mg, 2.5 mg, Nebulization, Q4H PRN, Howerter, Justin B, DO   cyclobenzaprine  (FLEXERIL ) tablet 5 mg, 5 mg, Oral, TID PRN, Garst, Jonathan R, MD   diazepam  (VALIUM ) injection 5 mg, 5 mg, Intravenous, Once PRN, Darnella, Dorn SAUNDERS, MD   fentaNYL  (SUBLIMAZE ) injection 25 mcg, 25 mcg, Intravenous, Q2H PRN, Howerter, Justin B, DO, 25 mcg at 06/14/24 0615   finasteride  (PROSCAR ) tablet 5 mg, 5 mg, Oral, Daily, Drusilla, Sabas RAMAN, MD, 5 mg at 06/16/24 9083   fluticasone  (FLONASE ) 50 MCG/ACT nasal spray 2 spray, 2 spray, Each Nare, Daily, Drusilla Sabas RAMAN, MD, 2 spray at 06/16/24 9082   fluticasone   furoate-vilanterol (BREO ELLIPTA ) 100-25 MCG/ACT 1 puff, 1 puff, Inhalation, Daily, Howerter, Justin B, DO, 1 puff at 06/16/24 0802   melatonin tablet 3 mg, 3 mg, Oral, QHS PRN, Howerter, Justin B, DO   naloxone  (NARCAN ) injection 0.4 mg, 0.4 mg, Intravenous, PRN, Howerter, Justin B, DO   neomycin -bacitracin -polymyxin (NEOSPORIN) ointment, , Topical, BID, Hammons, Kimberly B, RPH   ondansetron  (ZOFRAN ) injection 4 mg, 4 mg, Intravenous, Q6H PRN, Howerter, Justin B, DO   oxyCODONE  (Oxy IR/ROXICODONE ) immediate release tablet 5 mg, 5 mg, Oral, Q4H PRN, Darnella, Dorn SAUNDERS, MD     Patients Current Diet:  Diet Order                  Diet regular Room service appropriate? Yes; Fluid consistency: Thin; Fluid restriction: 1500 mL Fluid  Diet effective now                       Precautions / Restrictions Precautions Precautions: Fall, Cervical Precaution Booklet Issued: No Precaution/Restrictions Comments: neurosurgery note states :No collar needed. Pt has order still in place for hard collar. Pt is wearing [place] J brace when entering room. Restrictions Weight Bearing Restrictions Per Provider Order: No Other Position/Activity Restrictions: cervical; no pushing/pulling lifting more than 5-10 lbs    Has the patient had 2 or more falls or a fall with injury in the past year? Yes   Prior Activity Level Community (5-7x/wk): independent living with decline in function over past 6 weeks. MCA. Lost drivers licence 2 weeks ago. Previously played tennis.   Prior Functional Level Self Care: Did the patient need help bathing, dressing, using the toilet or eating? Independent   Indoor Mobility: Did the patient need assistance with walking from room to room (with or without device)? Independent   Stairs: Did the patient need assistance with internal or external stairs (with or without device)? Independent   Functional Cognition: Did the patient need help planning regular tasks such as shopping or  remembering to take medications? Independent   Patient Information Are you of Hispanic, Latino/a,or Spanish origin?: A. No, not of Hispanic, Latino/a, or Spanish origin What is your race?: A. White Do you need or want an interpreter to communicate with a doctor or health care staff?: 0. No   Patient's Response To:  Health Literacy and Transportation Is the patient able to respond to health literacy and transportation needs?: No Health Literacy - How often do you need to have someone help you when you read instructions, pamphlets, or other written material from your doctor or pharmacy?: Patient unable to respond In the past 12  months, has lack of transportation kept you from medical appointments or from getting medications?: No (per dtr) In the past 12 months, has lack of transportation kept you from meetings, work, or from getting things needed for daily living?: No (per dtr) Primary School Teacher and Transportation obtained via proxy: yes, Surveyor, Mining / Equipment Home Equipment: Agricultural Consultant (2 wheels)   Prior Device Use: Indicate devices/aids used by the patient prior to current illness, exacerbation or injury? None of the above   Current Functional Level Cognition   Orientation Level: Oriented to person, Oriented to place, Disoriented to time, Oriented to situation    Extremity Assessment (includes Sensation/Coordination)   Upper Extremity Assessment: Generalized weakness, Overall WFL for tasks assessed  Lower Extremity Assessment: Generalized weakness     ADLs         Mobility   Overal bed mobility: Needs Assistance Bed Mobility: Sidelying to Sit, Sit to Sidelying, Rolling Rolling: Mod assist Sidelying to sit: Min assist Sit to sidelying: Min assist General bed mobility comments: Pt in recliner on arrival.     Transfers   Overall transfer level: Needs assistance Equipment used: Rolling walker (2 wheels) Transfers: Sit to/from Stand Sit to Stand: Min  assist, Mod assist General transfer comment: min assist from recliner. Mod assist from low toilet.     Ambulation / Gait / Stairs / Wheelchair Mobility   Ambulation/Gait Ambulation/Gait assistance: Min assist, +2 safety/equipment Gait Distance (Feet): 165 Feet Assistive device: Rolling walker (2 wheels) Gait Pattern/deviations: Step-through pattern, Decreased stride length General Gait Details: Assist for balance. Attempted to assess amb without AD but pt unable to take steps without BUE support. +2 for chair follow Gait velocity: decreased Gait velocity interpretation: <1.31 ft/sec, indicative of household ambulator     Posture / Balance Balance Overall balance assessment: Needs assistance Sitting-balance support: Feet supported, No upper extremity supported Sitting balance-Leahy Scale: Fair Standing balance support: Bilateral upper extremity supported, During functional activity, Reliant on assistive device for balance Standing balance-Leahy Scale: Poor Standing balance comment: Min A to CGA     Special considerations/life events  Delirium precautions' Fall precautions HOH even with hearing aids. Must write on wipe board to communicate Very independent prior to 6 weeks ago    Previous Home Environment  Living Arrangements: Alone (wife is SNF at [facility])  Lives With: Alone Available Help at Discharge:  (family can arrange 24/7 care, but anticipated SNF [facility] then ALF after CIR) Type of Home: Independent living facility Care Facility Name: [facility] Home Layout: One level Home Access: Level entry Bathroom Shower/Tub: Health Visitor: Standard Bathroom Accessibility: Yes How Accessible: Accessible via walker Home Care Services: No Type of Home Care Services: Homehealth aide   Discharge Living Setting Plans for Discharge Living Setting: Other (Comment) Gatha plans SNF at [facility] after CIR, then ALF) Type of Home at Discharge: Skilled Nursing  Facility Care Facility Name at Discharge: [facility] then ALF at [facility] Discharge Home Layout: One level Discharge Home Access: Level entry Discharge Bathroom Shower/Tub: Walk-in shower Discharge Bathroom Toilet: Handicapped height Discharge Bathroom Accessibility: Yes How Accessible: Accessible via walker Does the patient have any problems obtaining your medications?: No   Social/Family/Support Systems Patient Roles: Spouse, Parent Contact Information: daughters Geni and Rolland are medical POAs. [place]i lives [place] area and [place] on the [address] Anticipated Caregiver: [facility] SNF/ALF Anticipated Caregiver's Contact Information: see contacts Ability/Limitations of Caregiver: daughters live out of state Caregiver Availability: 24/7 (family can arrange 24/7 caregivers  at ALF) Discharge Plan Discussed with Primary Caregiver: Yes Is Caregiver In Agreement with Plan?: Yes Does Caregiver/Family have Issues with Lodging/Transportation while Pt is in Rehab?: No   Goals Patient/Family Goal for Rehab: supervision to min with PT and OT Expected length of stay: ELOS 5 to 7 days Pt/Family Agrees to Admission and willing to participate: Yes Program Orientation Provided & Reviewed with Pt/Caregiver Including Roles  & Responsibilities: Yes   Decrease burden of Care through IP rehab admission: n/a   Possible need for SNF placement upon discharge: [facility] then ALF. Daughter, Geni , has spoken with DON at [facility] to begin arrangements    Patient Condition: I have reviewed medical records from [facility], spoken with CM, and patient and daughter. I met with patient at the bedside and discussed via phone for inpatient rehabilitation assessment.  Patient will benefit from ongoing PT and OT, can actively participate in 3 hours of therapy a day 5 days of the week, and can make measurable gains during the admission.  Patient will also benefit from the coordinated team  approach during an Inpatient Acute Rehabilitation admission.  The patient will receive intensive therapy as well as Rehabilitation physician, nursing, social worker, and care management interventions.  Due to bladder management, bowel management, safety, skin/wound care, disease management, medication administration, pain management, and patient education the patient requires 24 hour a day rehabilitation nursing.  The patient is currently min to mod assist overall with mobility and basic ADLs.  Discharge setting and therapy post discharge at  SNF or assisted living facility is anticipated.  Patient has agreed to participate in the Acute Inpatient Rehabilitation Program and will admit tomorrow.   Preadmission Screen Completed By:  Alison Heron Lot, RN MSN11/21/2025 3:15 PM ______________________________________________________________________   Discussed status with Dr. Carilyn on 06/16/24 at 1500 and received approval for admission tomorrow when bed is available.   Admission Coordinator:  Alison Heron Lot, RN, time 1500 Date 06/16/24    Assessment/Plan: Diagnosis: displaced C2 fracture due to fall  Does the need for close, 24 hr/day Medical supervision in concert with the patient's rehab needs make it unreasonable for this patient to be served in a less intensive setting? Yes Co-Morbidities requiring supervision/potential complications: hx of MCA infarct, mild Alzheimers dementia, severe hearing impairment, COPD Due to bladder management, bowel management, safety, skin/wound care, disease management, medication administration, pain management, and patient education, does the patient require 24 hr/day rehab nursing? Yes Does the patient require coordinated care of a physician, rehab nurse, PT, OT, and SLP to address physical and functional deficits in the context of the above medical diagnosis(es)? Yes Addressing deficits in the following areas: balance, endurance, locomotion, strength,  transferring, bowel/bladder control, bathing, dressing, toileting, and cognition Can the patient actively participate in an intensive therapy program of at least 3 hrs of therapy 5 days a week? Yes The potential for patient to make measurable gains while on inpatient rehab is good Anticipated functional outcomes upon discharge from inpatient rehab: supervision PT, supervision OT, supervision SLP Estimated rehab length of stay to reach the above functional goals is: 5-7d Anticipated discharge destination: Home 10. Overall Rehab/Functional Prognosis: good     MD Signature: Prentice CHARLENA Adrian M.D. [facility] Fellow Am Acad of Phys Med and Rehab Diplomate Am Board of Electrodiagnostic Med Fellow Am Board of Interventional Pain            Revision History  Date/Time User Provider Type Action  06/17/2024 10:12 AM  Adrian Prentice BRAVO, MD Physician Sign  06/17/2024  9:10 AM Alison Heron MATSU, RN Rehab Admission Coordinator Share  06/16/2024  3:33 PM Alison Heron MATSU, RN Rehab Admission Coordinator Share   View Details Report

## 2024-06-17 NOTE — Progress Notes (Signed)
 IP rehab admissions - I met with patient's daughters at the bedside today.  Paperwork all signed, given rehab letter copy as well.  Has been cleared by rehab MD for admission to CIR today.  Bed available and will admit to CIR today.  [phone]

## 2024-06-17 NOTE — H&P (Signed)
 Physical Medicine and Rehabilitation Admission H&P        Chief Complaint  Patient presents with   Funcitonal deficits due to sequalae from unstable odontoid Fx      HPI: Adrian Ray. Adrian Ray is an 89 year old male with history of COPD, bronchiectasis, BPH, B 12 deficiency, recent left clavicle Fx 6 weeks ago, Alzheimer's dementia who was admitted from his ILF after unwitnessed fall onto face with reports of neck pain and had multiple abrasions on BUE/BLE and face. He reported that he was walking when he felt weak, bent over and then fell onto his face. 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 He was taken to OR for C1-C2 segmental instrumentation and fusion with reduction of C2 Fx by Dr. Darnella. Post op PT/OT evaluations done and patient noted to be requiring min assist for mobility with BUE support, has balance deficits as well as hearing loss with ability to follow simple commands with increased time. He is limited by neck pain ad needs communication board for conversation. He was independent PTA and therapy/family requested CIR due to functional decline.      Review of Systems  HENT:  Positive for hearing loss and tinnitus.   Eyes:  Negative for blurred vision.  Respiratory:  Negative for cough and shortness of breath.   Cardiovascular:  Negative for chest pain and palpitations.  Gastrointestinal:  Negative for abdominal pain.  Musculoskeletal:  Positive for falls (unsteady gait).  Neurological:  Positive for weakness.           Past Medical History:  Diagnosis Date   BPH (benign prostatic hyperplasia)     Cataracts, bilateral     Clavicle fracture      left   COPD (chronic obstructive pulmonary disease) (HCC)     Dementia (HCC)     GERD (gastroesophageal reflux disease)     Hard of hearing     Hyperlipemia                 Past Surgical History:  Procedure Laterality Date   APPENDECTOMY       POSTERIOR  CERVICAL FUSION/FORAMINOTOMY N/A 06/14/2024    Procedure: POSTERIOR CERVICAL FUSION/FORAMINOTOMY CERVICAL ONE- TWO;  Surgeon: Darnella Dorn SAUNDERS, MD;  Location: [facility] OR;  Service: Neurosurgery;  Laterality: N/A;  POSTERIOR CERVICAL FUSION/ C1-C2   SHOULDER SURGERY Right      2003          History reviewed. No pertinent family history.         Social History: Married. Retired. Used to smoke cigarettes X 25 years.   reports that he has never smoked. He has never used smokeless tobacco. He reports current alcohol use. He reports that he does not use drugs.     Allergies       Allergies  Allergen Reactions   Tetracycline Swelling      Lip swelling   Codeine Other (See Comments)      Unknown reaction   Shellfish Allergy  Hives and Nausea And Vomiting              Medications Prior to Admission  Medication Sig Dispense Refill   Cyanocobalamin  (B-12 PO) Take 1 tablet by mouth daily.       donepezil (ARICEPT) 5 MG tablet Take 5 mg by mouth at bedtime.       finasteride  (PROSCAR ) 5 MG tablet Take 5 mg by mouth daily.  fluticasone  (FLONASE ) 50 MCG/ACT nasal spray Place 2 sprays into both nostrils daily. 16 g 10   rosuvastatin (CRESTOR) 5 MG tablet Take 1 tablet by mouth every Monday, Wednesday, and Friday.                Home: Home Living Family/patient expects to be discharged to:: Private residence Living Arrangements: Alone (wife is SNF at [facility]) Available Help at Discharge:  (family can arrange 24/7 care, but anticipated SNF [facility] then ALF after CIR) Type of Home: Independent living facility Home Access: Level entry Home Layout: One level Bathroom Shower/Tub: Health Visitor: Standard Bathroom Accessibility: Yes Home Equipment: Agricultural Consultant (2 wheels)  Lives With: Alone   Functional History: Prior Function Prior Level of Function : Independent/Modified Independent, Driving Mobility Comments: pt reports ind with mobility ADLs Comments:  active, ind with ADLs, lives at ind living facility   Functional Status:  Mobility: Bed Mobility Overal bed mobility: Needs Assistance Bed Mobility: Sidelying to Sit, Sit to Sidelying, Rolling Rolling: Mod assist Sidelying to sit: Min assist Sit to sidelying: Min assist General bed mobility comments: Pt in recliner on arrival. Transfers Overall transfer level: Needs assistance Equipment used: Rolling walker (2 wheels) Transfers: Sit to/from Stand Sit to Stand: Min assist, Mod assist General transfer comment: min assist from recliner. Mod assist from low toilet. Ambulation/Gait Ambulation/Gait assistance: Min assist, +2 safety/equipment Gait Distance (Feet): 165 Feet Assistive device: Rolling walker (2 wheels) Gait Pattern/deviations: Step-through pattern, Decreased stride length General Gait Details: Assist for balance. Attempted to assess amb without AD but pt unable to take steps without BUE support. +2 for chair follow Gait velocity: decreased Gait velocity interpretation: <1.31 ft/sec, indicative of household ambulator   ADL:   Cognition: Cognition Orientation Level: Oriented to person, Oriented to place, Disoriented to time, Oriented to situation Cognition Arousal: Alert Behavior During Therapy: Adrian Ray for tasks assessed/performed, Flat affect     Blood pressure 131/69, pulse 64, temperature 97.8 F (36.6 C), temperature source Oral, resp. rate 16, height (P) 6' 2.5 (1.892 m), weight (P) 81.6 kg, SpO2 98%. Physical Exam Vitals and nursing note reviewed.  Constitutional:      Appearance: Normal appearance.     Comments: Abrasions on forehead and nose.   Neurological:     Mental Status: He is alert.   General: No acute distress Mood and affect are appropriate Heart: Regular rate and rhythm no rubs murmurs or extra sounds Lungs: Clear to auscultation, breathing unlabored, no rales or wheezes Abdomen: Positive bowel sounds, soft nontender to palpation,  nondistended Extremities: No clubbing, cyanosis, or edema Skin: No evidence of breakdown, no evidence of rash, cervical incision CDI, bilateral knee abrasions multiple some epithelialized some not, no lacerations Neurologic: motor strength is 4/5 in bilateral deltoid, bicep, tricep, grip, hip flexor, knee extensors, ankle dorsiflexor and plantar flexor Sensory exam normal sensation to light touch in bilateral upper and lower extremities Cerebellar exam normal finger to nose to finger Musculoskeletal: Full range of motion in all 4 extremities. No joint swelling      Lab Results Last 48 Hours        Results for orders placed or performed during the hospital encounter of 06/13/24 (from the past 48 hours)  CBC     Status: Abnormal    Collection Time: 06/15/24  5:16 AM  Result Value Ref Range    WBC 17.6 (H) 4.0 - 10.5 K/uL    RBC 4.14 (L) 4.22 - 5.81 MIL/uL    Hemoglobin  13.5 13.0 - 17.0 g/dL    HCT 60.8 60.9 - 47.9 %    MCV 94.4 80.0 - 100.0 fL    MCH 32.6 26.0 - 34.0 pg    MCHC 34.5 30.0 - 36.0 g/dL    RDW 86.7 88.4 - 84.4 %    Platelets 203 150 - 400 K/uL    nRBC 0.0 0.0 - 0.2 %      Comment: Performed at [facility], [address], [place], [place] [address]  Comprehensive metabolic panel     Status: Abnormal    Collection Time: 06/15/24  5:16 AM  Result Value Ref Range    Sodium 132 (L) 135 - 145 mmol/L    Potassium 4.3 3.5 - 5.1 mmol/L    Chloride 98 98 - 111 mmol/L    CO2 22 22 - 32 mmol/L    Glucose, Bld 123 (H) 70 - 99 mg/dL      Comment: Glucose reference range applies only to samples taken after fasting for at least 8 hours.    BUN 8 8 - 23 mg/dL    Creatinine, Ser 9.15 0.61 - 1.24 mg/dL    Calcium 8.3 (L) 8.9 - 10.3 mg/dL    Total Protein 6.1 (L) 6.5 - 8.1 g/dL    Albumin 2.9 (L) 3.5 - 5.0 g/dL    AST 27 15 - 41 U/L    ALT 12 0 - 44 U/L    Alkaline Phosphatase 95 38 - 126 U/L    Total Bilirubin 1.3 (H) 0.0 - 1.2 mg/dL    GFR, Estimated >39 >39 mL/min       Comment: (NOTE) Calculated using the CKD-EPI Creatinine Equation (2021)      Anion gap 12 5 - 15      Comment: Performed at [facility], [address], [place], [place] [address]  Glucose, capillary     Status: Abnormal    Collection Time: 06/15/24  2:24 PM  Result Value Ref Range    Glucose-Capillary 129 (H) 70 - 99 mg/dL      Comment: Glucose reference range applies only to samples taken after fasting for at least 8 hours.  CBC     Status: Abnormal    Collection Time: 06/16/24  4:37 AM  Result Value Ref Range    WBC 15.9 (H) 4.0 - 10.5 K/uL    RBC 3.76 (L) 4.22 - 5.81 MIL/uL    Hemoglobin 12.2 (L) 13.0 - 17.0 g/dL    HCT 64.5 (L) 60.9 - 52.0 %    MCV 94.1 80.0 - 100.0 fL    MCH 32.4 26.0 - 34.0 pg    MCHC 34.5 30.0 - 36.0 g/dL    RDW 86.9 88.4 - 84.4 %    Platelets 214 150 - 400 K/uL    nRBC 0.0 0.0 - 0.2 %      Comment: Performed at [facility], [address], [place], [place] [address]  Basic metabolic panel     Status: Abnormal    Collection Time: 06/16/24  4:37 AM  Result Value Ref Range    Sodium 128 (L) 135 - 145 mmol/L    Potassium 3.7 3.5 - 5.1 mmol/L    Chloride 96 (L) 98 - 111 mmol/L    CO2 22 22 - 32 mmol/L    Glucose, Bld 108 (H) 70 - 99 mg/dL      Comment: Glucose reference range applies only to samples taken after fasting  for at least 8 hours.    BUN 10 8 - 23 mg/dL    Creatinine, Ser 9.13 0.61 - 1.24 mg/dL    Calcium 8.1 (L) 8.9 - 10.3 mg/dL    GFR, Estimated >39 >39 mL/min      Comment: (NOTE) Calculated using the CKD-EPI Creatinine Equation (2021)      Anion gap 10 5 - 15      Comment: Performed at [facility], [address] [place], [place] [address]       Imaging Results (Last 48 hours)  ECHOCARDIOGRAM COMPLETE Result Date: 06/16/2024    ECHOCARDIOGRAM REPORT   Patient Name:   HUNTLEY KNOOP Date of Exam: 06/16/2024 Medical Rec #:  [id number]   Height:       74.5 in Accession #:    [id number]  Weight:       180.0 lb Date of  Birth:  [birth year]   BSA:          2.088 m Patient Age:    89 years    BP:           129/63 mmHg Patient Gender: M           HR:           70 bpm. Exam Location:  Inpatient Procedure: 2D Echo, Cardiac Doppler and Color Doppler (Both Spectral and Color            Flow Doppler were utilized during procedure). Indications:    Syncope  History:        Patient has no prior history of Echocardiogram examinations.                 COPD; Risk Factors:Dyslipidemia.  Sonographer:    Sherlean Dubin Referring Phys: MILBERT SABAS RAMAN LAMA  Sonographer Comments: Image acquisition challenging due to respiratory motion. IMPRESSIONS  1. Left ventricular ejection fraction, by estimation, is 60 to 65%. The left ventricle has normal function. The left ventricle has no regional wall motion abnormalities. Left ventricular diastolic parameters were normal.  2. Right ventricular systolic function is normal. The right ventricular size is normal.  3. The mitral valve is normal in structure. No evidence of mitral valve regurgitation. No evidence of mitral stenosis.  4. The aortic valve is tricuspid. There is mild calcification of the aortic valve. There is mild thickening of the aortic valve. Aortic valve regurgitation is not visualized. Aortic valve sclerosis is present, with no evidence of aortic valve stenosis.  5. The inferior vena cava is normal in size with greater than 50% respiratory variability, suggesting right atrial pressure of 3 mmHg. FINDINGS  Left Ventricle: Left ventricular ejection fraction, by estimation, is 60 to 65%. The left ventricle has normal function. The left ventricle has no regional wall motion abnormalities. Strain was performed and the global longitudinal strain is indeterminate. The left ventricular internal cavity size was normal in size. There is no left ventricular hypertrophy. Left ventricular diastolic parameters were normal. Right Ventricle: The right ventricular size is normal. No increase in right ventricular  wall thickness. Right ventricular systolic function is normal. Left Atrium: Left atrial size was normal in size. Right Atrium: Right atrial size was normal in size. Pericardium: There is no evidence of pericardial effusion. Mitral Valve: The mitral valve is normal in structure. No evidence of mitral valve regurgitation. No evidence of mitral valve stenosis. Tricuspid Valve: The tricuspid valve is normal in structure. Tricuspid valve regurgitation is not demonstrated. No evidence of tricuspid  stenosis. Aortic Valve: The aortic valve is tricuspid. There is mild calcification of the aortic valve. There is mild thickening of the aortic valve. Aortic valve regurgitation is not visualized. Aortic valve sclerosis is present, with no evidence of aortic valve stenosis. Pulmonic Valve: The pulmonic valve was normal in structure. Pulmonic valve regurgitation is not visualized. No evidence of pulmonic stenosis. Aorta: The aortic root is normal in size and structure. Venous: The inferior vena cava is normal in size with greater than 50% respiratory variability, suggesting right atrial pressure of 3 mmHg. IAS/Shunts: No atrial level shunt detected by color flow Doppler. Additional Comments: 3D was performed not requiring image post processing on an independent workstation and was indeterminate.  LEFT VENTRICLE PLAX 2D LVIDd:         4.00 cm   Diastology LVIDs:         2.70 cm   LV e' medial:    10.00 cm/s LV PW:         1.00 cm   LV E/e' medial:  5.9 LV IVS:        0.90 cm   LV e' lateral:   12.70 cm/s LVOT diam:     2.30 cm   LV E/e' lateral: 4.6 LV SV:         84 LV SV Index:   40 LVOT Area:     4.15 cm  RIGHT VENTRICLE             IVC RV Basal diam:  3.50 cm     IVC diam: 1.60 cm RV Mid diam:    2.50 cm RV S prime:     11.60 cm/s TAPSE (M-mode): 1.8 cm LEFT ATRIUM           Index        RIGHT ATRIUM           Index LA diam:      2.50 cm 1.20 cm/m   RA Area:     13.60 cm LA Vol (A2C): 27.2 ml 13.03 ml/m  RA Volume:   26.40  ml  12.64 ml/m LA Vol (A4C): 26.5 ml 12.69 ml/m  AORTIC VALVE LVOT Vmax:   92.80 cm/s LVOT Vmean:  60.800 cm/s LVOT VTI:    0.201 m  AORTA Ao Root diam: 2.40 cm MITRAL VALVE MV Area (PHT): 2.72 cm    SHUNTS MV Decel Time: 279 msec    Systemic VTI:  0.20 m MV E velocity: 58.70 cm/s  Systemic Diam: 2.30 cm MV A velocity: 71.80 cm/s MV E/A ratio:  0.82 Maude Emmer MD Electronically signed by Maude Emmer MD Signature Date/Time: 06/16/2024/2:10:37 PM    Final     DG Cervical Spine 2 or 3 views Result Date: 06/15/2024 EXAM: 2 or 3 VIEW(S) XRAY OF THE CERVICAL SPINE 06/15/2024 11:34:00 AM COMPARISON: Comparison 06/13/2024. CLINICAL HISTORY: Postoperative for C2 fracture comparison 06/13/2024. FINDINGS: BONES: Posterolateral rod and screw fixation observed at C1-C2 with marked improvement in displacement of the prior fracture, currently 2 mm posterior displacement of the odontoid with respect to the C2 vertebral body, previously 10 mm. No complicating feature observed. Interbody and posterior element fusion at C2-C3. Fused facet joints bilaterally at C4-C5. Bony demineralization. Alignment is normal. No aggressive appearing osseous lesion. DISCS AND DEGENERATIVE CHANGES: Cervical spondylosis is present. SOFT TISSUES: Expected gas in the soft tissues. No prevertebral soft tissue swelling. The visualized lungs appear clear. IMPRESSION: 1. Marked improvement in displacement of the prior C2 fracture with posterolateral rod  and screw fixation at C1-2, with 2 mm posterior displacement of the odontoid relative to the C2 vertebral body, previously 10 mm, without complicating features. 2. Interbody and posterior element fusion at C2-3. 3. Cervical spondylosis. 4. Bony demineralization. 5. Fused facet joints bilaterally at C4-5. Electronically signed by: Ryan Salvage MD 06/15/2024 03:36 PM EST RP Workstation: [id number]    DG Cervical Spine 2 or 3 views Result Date: 06/14/2024 EXAM: 3 FLUOROSCOPIC INTRAOPERATIVE  VIEW(S) XRAY OF THE CERVICAL SPINE 06/14/2024 06:32:00 PM COMPARISON: Prior CT examination of 06/13/2024. CLINICAL HISTORY: [id number] Surgery, elective [id number]. Surgery, elective [id number]. FINDINGS: BONES: Intraoperative posterior fusion with instrumentation of C1 and C2 with bilateral laminar screws and posterior bars. Dense fractures are again identified demonstrating 2 cortical width posterior displacement, with decreased displacement when compared to prior CT examination of 06/13/2024. Alignment is altered by the fracture and instrumentation. No aggressive appearing osseous lesion. DISCS AND DEGENERATIVE CHANGES: No severe degenerative changes. SOFT TISSUES: Prevertebral soft tissue swelling again noted. The visualized lungs appear clear. FLUOROSCOPY: Fluoroscopy time: 9.1 seconds. Fluoroscopic dose: 1.6 mg. IMPRESSION: 1. Intraoperative posterior fusion with instrumentation of C1 and C2 with bilateral laminar screws and posterior bars. 2. Dens fractures with 2 cortical width posterior displacement, decreased compared to prior CT examination of 06/13/24. 3. Prevertebral soft tissue swelling. Electronically signed by: Dorethia Molt MD 06/14/2024 11:44 PM EST RP Workstation: [id number]    DG C-Arm 1-60 Min-No Report Result Date: 06/14/2024 Fluoroscopy was utilized by the requesting physician.  No radiographic interpretation.    DG C-Arm 1-60 Min-No Report Result Date: 06/14/2024 Fluoroscopy was utilized by the requesting physician.  No radiographic interpretation.    DG C-Arm 1-60 Min-No Report Result Date: 06/14/2024 Fluoroscopy was utilized by the requesting physician.  No radiographic interpretation.    DG O-ARM IMAGE ONLY/NO REPORT Result Date: 06/14/2024 There is no Radiologist interpretation  for this exam.          Blood pressure 131/69, pulse 64, temperature 97.8 F (36.6 C), temperature source Oral, resp. rate 16, height (P) 6' 2.5 (1.892 m), weight (P) 81.6 kg, SpO2 98%.   Medical  Problem List and Plan: 1. Functional deficits secondary to odontoid fracture             -patient may  shower if incision is covered             -ELOS/Goals: 5-7d Sup/minA 2.  Antithrombotics: -DVT/anticoagulation:  Mechanical: Sequential compression devices, below knee Bilateral lower extremities             -antiplatelet therapy: N/A 3. Pain Management: Oxycodone  prn. Tylenol  prn 4. Mood/Behavior/Sleep: LCSW to follow for evaluation and support             --Melatonin prn for sleep.              -antipsychotic agents: N/A 5. Neuropsych/cognition: This patient appears to be capable of making decisions on his own behalf. 6. Skin/Wound Care: routine pressure relief measures 7. Fluids/Electrolytes/Nutrition: Monitor I/O. Check CMET in am. 8. Chronic rhinitis/allergies: Has chronic cough a/w this --continue Flonase .   9. BPH: Managed with Proscar  10. Alzheimer's dementia: Per family decline over  past couple of years. Tends to repeat himself             --very HOH. Dry erase board for communication needs.   11. Acute on chronic hyponatremia: Baseline Na-132-->128             --Question SIADH v/s cerebral salt wasting.  Sharlet GORMAN Schmitz, PA-C 06/16/2024

## 2024-06-17 NOTE — Progress Notes (Signed)
 Occupational Therapy Session Note  Patient Details  Name: JOHNROSS NABOZNY MRN: [id number] Date of Birth: 1933-12-12  {CHL IP REHAB OT TIME CALCULATIONS:[id number]}   Short Term Goals: Week 1:     Skilled Therapeutic Interventions/Progress Updates:    Patient agreeable to participate in OT session. Reports *** pain level.   Patient participated in skilled OT session focusing on ***. Therapist facilitated/assessed/developed/educated/integrated/elicited *** in order to improve/facilitate/promote   Therapy Documentation Precautions:    General:    Therapy/Group: Individual Therapy  Leita Howell, OTR/L,CBIS  Supplemental OT - [facility] and [facility] Secure Chat Preferred   06/17/2024, 10:03 PM

## 2024-06-17 NOTE — Progress Notes (Signed)
 Report given to Autumn RN at 4M10. All questions and concerns were fully answered.

## 2024-06-17 NOTE — Progress Notes (Signed)
 Inpatient Rehabilitation Admission Medication Review by a Pharmacist  A complete drug regimen review was completed for this patient to identify any potential clinically significant medication issues.  High Risk Drug Classes Is patient taking? Indication by Medication  Antipsychotic Yes, as an intravenous medication   Anticoagulant No   Antibiotic No   Opioid Yes Oxycodone  - pain  Antiplatelet No   Hypoglycemics/insulin No   Vasoactive Medication No   Chemotherapy No   Other Yes Flexeril  - muscle spasms Proscar  - BPH Flonase  - allergies Breo - COPD Neosporin - skin care Narcan  - opioid reversal Vitamin B12 - supplement fleet enema , bisacodyl  , and - constipation Maalox- indigestion Diphenhydramine - itching  Acetaminophen - pain  Robitussin- cough  albuterol - wheezing/SOB  melatonin and -insomnia     Type of Medication Issue Identified Description of Issue Recommendation(s)  Drug Interaction(s) (clinically significant)     Duplicate Therapy     Allergy      No Medication Administration End Date     Incorrect Dose     Additional Drug Therapy Needed     Significant med changes from prior encounter (inform family/care partners about these prior to discharge). PTA donepezil and rosuvastatin not resumed. Communicate relevant medication changes to patient/family members at discharge from CIR.   Restart or discontinue PTA meds not resumed in CIR at discharge if clinically indicated.   Other       Clinically significant medication issues were identified that warrant physician communication and completion of prescribed/recommended actions by midnight of the next day:  No  Name of provider notified for urgent issues identified:   Provider Method of Notification:     Pharmacist comments:   Time spent performing this drug regimen review (minutes):  15   Rocky Slade, PharmD, BCPS 06/17/2024 6:50 PM

## 2024-06-17 NOTE — Progress Notes (Addendum)
 Physical Therapy Treatment Patient Details Name: Adrian Ray MRN: [id number] DOB: 1934-02-04 Today's Date: 06/17/2024   History of Present Illness Pt is 89 yo presenting to [facility] On 11/18 following a ground level fall with type II odontoid fracture. Currently pt is s/p C1 reduction and C1-C2 fusion on 11/19. PMH: dementia, sensorineural hearing loss, hyperlipidemia, COPD.    PT Comments  Pt resting in bed on arrival, agreeable to session and demonstrating steady progress. Pt demonstrating good adherence to cervical precautions throughout mobility and demonstrating increased activity tolerance, progressing ambulation distance for x2 bouts with seated rest due to fatigue. Pt requiring grossly min A to complete bed mobility, transfers sit<>stand and gait with RW for support. Pt up in chair at end of session with all needs met. Pt continues to benefit from skilled PT services to progress toward functional mobility goals.     If plan is discharge home, recommend the following: Assist for transportation;Assistance with cooking/housework   Can travel by private vehicle        Equipment Recommendations  BSC/3in1;Rolling walker (2 wheels)    Recommendations for Other Services       Precautions / Restrictions Precautions Precautions: Fall;Cervical Precaution Booklet Issued: No Recall of Precautions/Restrictions: Impaired Precaution/Restrictions Comments: neurosurgery note states :No collar needed. Pt has order still in place for hard collar. Pt is wearing [place] J brace when entering room. Restrictions Weight Bearing Restrictions Per Provider Order: No Other Position/Activity Restrictions: cervical; no pushing/pulling lifting more than 5-10 lbs     Mobility  Bed Mobility Overal bed mobility: Needs Assistance Bed Mobility: Sidelying to Sit, Rolling Rolling: Contact guard assist Sidelying to sit: Min assist       General bed mobility comments: min A to elevate trunk to sitting     Transfers Overall transfer level: Needs assistance Equipment used: Rolling walker (2 wheels) Transfers: Sit to/from Stand Sit to Stand: Min assist           General transfer comment: min A with cues for hand placement, from EOB and bench in hall    Ambulation/Gait Ambulation/Gait assistance: Min assist Gait Distance (Feet): 110 Feet (x2) Assistive device: Rolling walker (2 wheels) Gait Pattern/deviations: Step-through pattern, Decreased stride length Gait velocity: decreased     General Gait Details: Assist for balance, seated rest due to fatigue   Stairs             Wheelchair Mobility     Tilt Bed    Modified Rankin (Stroke Patients Only)       Balance Overall balance assessment: Needs assistance Sitting-balance support: Feet supported, No upper extremity supported Sitting balance-Leahy Scale: Fair     Standing balance support: Bilateral upper extremity supported, During functional activity, Reliant on assistive device for balance Standing balance-Leahy Scale: Poor Standing balance comment: Min A to CGA                            Communication Communication Communication: Impaired Factors Affecting Communication: Hearing impaired  Cognition Arousal: Alert Behavior During Therapy: WFL for tasks assessed/performed, Flat affect   PT - Cognitive impairments: History of cognitive impairments, Difficult to assess Difficult to assess due to: Hard of hearing/deaf                     PT - Cognition Comments: Very HOH. Following commands: Intact, Impaired Following commands impaired: Follows one step commands with increased time    Cueing Cueing Techniques:  Verbal cues, Tactile cues  Exercises      General Comments        Pertinent Vitals/Pain Pain Assessment Pain Assessment: Faces Faces Pain Scale: Hurts little more Pain Location: surgical site Pain Descriptors / Indicators: Sore, Grimacing Pain Intervention(s):  Monitored during session, Limited activity within patient's tolerance, Repositioned    Home Living                          Prior Function            PT Goals (current goals can now be found in the care plan section) Acute Rehab PT Goals Patient Stated Goal: independence PT Goal Formulation: With patient Time For Goal Achievement: 06/29/24 Progress towards PT goals: Progressing toward goals    Frequency    Min 3X/week      PT Plan      Co-evaluation              AM-PAC PT 6 Clicks Mobility   Outcome Measure  Help needed turning from your back to your side while in a flat bed without using bedrails?: A Little Help needed moving from lying on your back to sitting on the side of a flat bed without using bedrails?: A Little Help needed moving to and from a bed to a chair (including a wheelchair)?: A Little Help needed standing up from a chair using your arms (e.g., wheelchair or bedside chair)?: A Little Help needed to walk in hospital room?: A Little Help needed climbing 3-5 steps with a railing? : A Lot 6 Click Score: 17    End of Session Equipment Utilized During Treatment: Gait belt Activity Tolerance: Patient tolerated treatment well Patient left: in chair;with call bell/phone within reach;with chair alarm set;with family/visitor present Nurse Communication: Mobility status PT Visit Diagnosis: Unsteadiness on feet (R26.81);Other abnormalities of gait and mobility (R26.89);Muscle weakness (generalized) (M62.81)     Time: 8863-8844 PT Time Calculation (min) (ACUTE ONLY): 19 min  Charges:    $Gait Training: 8-22 mins PT General Charges $$ ACUTE PT VISIT: 1 Visit                     Jaja Switalski R. PTA Acute Rehabilitation Services Office: [phone]   Therisa CHRISTELLA Boor 06/17/2024, 1:37 PM

## 2024-06-17 NOTE — Discharge Summary (Signed)
 Physician Discharge Summary   Patient: Adrian Ray MRN: [id number] DOB: 1934-05-30  Admit date:     06/13/2024  Discharge date: 06/17/24  Discharge Physician: [name]   PCP: Ransom Other, MD   Recommendations at discharge:   Follow-up neurosurgery as outpatient Patient to go to inpatient rehab Check orthostatic vital signs intermittently  Discharge Diagnoses: Principal Problem:   C2 cervical fracture (HCC) Active Problems:   Leukocytosis   Hyperlipidemia   BPH (benign prostatic hyperplasia)   C1-C2 vertebral instability   Dens fracture (HCC)   Fall   History of COPD  Resolved Problems:   * No resolved hospital problems. *  Hospital Course: 89 y.o. male with medical history significant for sensorineural hearing loss, hyperlipidemia, COPD, who is admitted to [facility] on 06/13/2024 with unstable acute displaced C2 dens fracture after presenting from assisted living facility to [facility] ([place] ED complaining of fall.  The patient was ambulating on the sidewalk at his assisted living facility, where he is reported to live independently, when he reports that he tripped resulting in a fall straight forward, with the anterior aspect of his face serving as the principal point of contact with a cement.  He denies any associated loss of consciousness   Assessment and Plan:  Unstable acute displaced C2 dens fracture, status post posterior C1-2 fusion - S/p mechanical fall, no loss of consciousness -CT head showed no acute intracranial process - Neurosurgery consulted; underwent posterior C1-2 fusion on 11/19 -Neurosurgery has cleared for discharge - PT evaluation obtained; patient is stable for discharge to CIR Follow-up with neurosurgery as outpatient -Will discharge on Flexeril  and oxycodone  as needed   Status post fall/?  Syncope - Patient denies loss of consciousness - Poor historian, also has dementia - Echocardiogram obtained today showed EF of 60-to 65%, no regional  wall motion abnormalities.  No valvular abnormality -Telemetry did not show any significant abnormality, showed normal sinus rhythm - Mildly orthostatic with blood pressure dropping to 120s on standing from 150s on sitting, then again improving to 140s after 3 minutes -Place TED hose -Check orthostatic vital signs intermittently     Leukocytosis - WBC count 14,000 on admission; WBC is up to 17,000 - UA is clear - Has been afebrile, normal vital signs - Likely reactive -WBC is 10.8 today     BPH - Tamsulosin on hold - Will restart Proscar    Interstitial lung disease - Stable, not requiring oxygen  - Continue Breo elliptica as needed   Hyperlipidemia - Continue statin   Hyponatremia - Chronic, sodium is 129 - Started fluid restriction 1.5 L  History of dementia - Restart Aricept - Delirium precautions           Consultants: Neurosurgery Procedures performed:  Disposition: Home Diet recommendation:  Discharge Diet Orders (From admission, onward)     Start     Ordered   06/17/24 0000  Diet - low sodium heart healthy        06/17/24 1108           Regular diet DISCHARGE MEDICATION: Allergies as of 06/17/2024       Reactions   Tetracycline Swelling   Lip swelling   Codeine Other (See Comments)   Unknown reaction   Shellfish Allergy  Hives, Nausea And Vomiting        Medication List     TAKE these medications    B-12 PO Take 1 tablet by mouth daily.   cyclobenzaprine  5 MG tablet Commonly known as:  FLEXERIL  Take 1 tablet (5 mg total) by mouth 3 (three) times daily as needed for muscle spasms.   donepezil 5 MG tablet Commonly known as: ARICEPT Take 5 mg by mouth at bedtime.   finasteride  5 MG tablet Commonly known as: PROSCAR  Take 5 mg by mouth daily.   fluticasone  50 MCG/ACT nasal spray Commonly known as: FLONASE  Place 2 sprays into both nostrils daily.   fluticasone  furoate-vilanterol 100-25 MCG/ACT Aepb Commonly known as: Breo  Ellipta Inhale 1 puff into the lungs daily.   neomycin -bacitracin -polymyxin Oint Commonly known as: NEOSPORIN Apply 1 Application topically 2 (two) times daily.   oxyCODONE  5 MG immediate release tablet Commonly known as: Oxy IR/ROXICODONE  Take 0.5-1 tablets (2.5-5 mg total) by mouth every 4 (four) hours as needed for severe pain (pain score 7-10).   rosuvastatin 5 MG tablet Commonly known as: CRESTOR Take 1 tablet by mouth every Monday, Wednesday, and Friday.        Follow-up Information     Darnella Dorn SAUNDERS, MD Follow up in 6 week(s).   Specialty: Neurosurgery Why: please call to arrange an appointment in Dr. Shelbie clinic in 6 weeks. Thank you Contact information: [address] [place] [address] [phone]                Discharge Exam: Fredricka Weights   06/14/24 1359  Weight: (P) 81.6 kg   General-appears in no acute distress Heart-S1-S2, regular, no murmur auscultated Lungs-clear to auscultation bilaterally, no wheezing or crackles auscultated Abdomen-soft, nontender, no organomegaly Extremities-no edema in the lower extremities Neuro-alert, oriented x3, no focal deficit noted  Condition at discharge: good  The results of significant diagnostics from this hospitalization (including imaging, microbiology, ancillary and laboratory) are listed below for reference.   Imaging Studies: ECHOCARDIOGRAM COMPLETE Result Date: 06/16/2024    ECHOCARDIOGRAM REPORT   Patient Name:   Adrian Ray Date of Exam: 06/16/2024 Medical Rec #:  [id number]   Height:       74.5 in Accession #:    [id number]  Weight:       180.0 lb Date of Birth:  06-17-1934   BSA:          2.088 m Patient Age:    89 years    BP:           129/63 mmHg Patient Gender: M           HR:           70 bpm. Exam Location:  Inpatient Procedure: 2D Echo, Cardiac Doppler and Color Doppler (Both Spectral and Color            Flow Doppler were utilized during procedure). Indications:    Syncope   History:        Patient has no prior history of Echocardiogram examinations.                 COPD; Risk Factors:Dyslipidemia.  Sonographer:    Sherlean Dubin Referring Phys: MILBERT SABAS RAMAN Desere Gwin  Sonographer Comments: Image acquisition challenging due to respiratory motion. IMPRESSIONS  1. Left ventricular ejection fraction, by estimation, is 60 to 65%. The left ventricle has normal function. The left ventricle has no regional wall motion abnormalities. Left ventricular diastolic parameters were normal.  2. Right ventricular systolic function is normal. The right ventricular size is normal.  3. The mitral valve is normal in structure. No evidence of mitral valve regurgitation. No evidence of mitral stenosis.  4. The aortic  valve is tricuspid. There is mild calcification of the aortic valve. There is mild thickening of the aortic valve. Aortic valve regurgitation is not visualized. Aortic valve sclerosis is present, with no evidence of aortic valve stenosis.  5. The inferior vena cava is normal in size with greater than 50% respiratory variability, suggesting right atrial pressure of 3 mmHg. FINDINGS  Left Ventricle: Left ventricular ejection fraction, by estimation, is 60 to 65%. The left ventricle has normal function. The left ventricle has no regional wall motion abnormalities. Strain was performed and the global longitudinal strain is indeterminate. The left ventricular internal cavity size was normal in size. There is no left ventricular hypertrophy. Left ventricular diastolic parameters were normal. Right Ventricle: The right ventricular size is normal. No increase in right ventricular wall thickness. Right ventricular systolic function is normal. Left Atrium: Left atrial size was normal in size. Right Atrium: Right atrial size was normal in size. Pericardium: There is no evidence of pericardial effusion. Mitral Valve: The mitral valve is normal in structure. No evidence of mitral valve regurgitation. No  evidence of mitral valve stenosis. Tricuspid Valve: The tricuspid valve is normal in structure. Tricuspid valve regurgitation is not demonstrated. No evidence of tricuspid stenosis. Aortic Valve: The aortic valve is tricuspid. There is mild calcification of the aortic valve. There is mild thickening of the aortic valve. Aortic valve regurgitation is not visualized. Aortic valve sclerosis is present, with no evidence of aortic valve stenosis. Pulmonic Valve: The pulmonic valve was normal in structure. Pulmonic valve regurgitation is not visualized. No evidence of pulmonic stenosis. Aorta: The aortic root is normal in size and structure. Venous: The inferior vena cava is normal in size with greater than 50% respiratory variability, suggesting right atrial pressure of 3 mmHg. IAS/Shunts: No atrial level shunt detected by color flow Doppler. Additional Comments: 3D was performed not requiring image post processing on an independent workstation and was indeterminate.  LEFT VENTRICLE PLAX 2D LVIDd:         4.00 cm   Diastology LVIDs:         2.70 cm   LV e' medial:    10.00 cm/s LV PW:         1.00 cm   LV E/e' medial:  5.9 LV IVS:        0.90 cm   LV e' lateral:   12.70 cm/s LVOT diam:     2.30 cm   LV E/e' lateral: 4.6 LV SV:         84 LV SV Index:   40 LVOT Area:     4.15 cm  RIGHT VENTRICLE             IVC RV Basal diam:  3.50 cm     IVC diam: 1.60 cm RV Mid diam:    2.50 cm RV S prime:     11.60 cm/s TAPSE (M-mode): 1.8 cm LEFT ATRIUM           Index        RIGHT ATRIUM           Index LA diam:      2.50 cm 1.20 cm/m   RA Area:     13.60 cm LA Vol (A2C): 27.2 ml 13.03 ml/m  RA Volume:   26.40 ml  12.64 ml/m LA Vol (A4C): 26.5 ml 12.69 ml/m  AORTIC VALVE LVOT Vmax:   92.80 cm/s LVOT Vmean:  60.800 cm/s LVOT VTI:    0.201 m  AORTA  Ao Root diam: 2.40 cm MITRAL VALVE MV Area (PHT): 2.72 cm    SHUNTS MV Decel Time: 279 msec    Systemic VTI:  0.20 m MV E velocity: 58.70 cm/s  Systemic Diam: 2.30 cm MV A velocity:  71.80 cm/s MV E/A ratio:  0.82 Maude Emmer MD Electronically signed by Maude Emmer MD Signature Date/Time: 06/16/2024/2:10:37 PM    Final    DG Cervical Spine 2 or 3 views Result Date: 06/15/2024 EXAM: 2 or 3 VIEW(S) XRAY OF THE CERVICAL SPINE 06/15/2024 11:34:00 AM COMPARISON: Comparison 06/13/2024. CLINICAL HISTORY: Postoperative for C2 fracture comparison 06/13/2024. FINDINGS: BONES: Posterolateral rod and screw fixation observed at C1-C2 with marked improvement in displacement of the prior fracture, currently 2 mm posterior displacement of the odontoid with respect to the C2 vertebral body, previously 10 mm. No complicating feature observed. Interbody and posterior element fusion at C2-C3. Fused facet joints bilaterally at C4-C5. Bony demineralization. Alignment is normal. No aggressive appearing osseous lesion. DISCS AND DEGENERATIVE CHANGES: Cervical spondylosis is present. SOFT TISSUES: Expected gas in the soft tissues. No prevertebral soft tissue swelling. The visualized lungs appear clear. IMPRESSION: 1. Marked improvement in displacement of the prior C2 fracture with posterolateral rod and screw fixation at C1-2, with 2 mm posterior displacement of the odontoid relative to the C2 vertebral body, previously 10 mm, without complicating features. 2. Interbody and posterior element fusion at C2-3. 3. Cervical spondylosis. 4. Bony demineralization. 5. Fused facet joints bilaterally at C4-5. Electronically signed by: Ryan Salvage MD 06/15/2024 03:36 PM EST RP Workstation: [id number]   DG Cervical Spine 2 or 3 views Result Date: 06/14/2024 EXAM: 3 FLUOROSCOPIC INTRAOPERATIVE VIEW(S) XRAY OF THE CERVICAL SPINE 06/14/2024 06:32:00 PM COMPARISON: Prior CT examination of 06/13/2024. CLINICAL HISTORY: [id number] Surgery, elective [id number]. Surgery, elective [id number]. FINDINGS: BONES: Intraoperative posterior fusion with instrumentation of C1 and C2 with bilateral laminar screws and posterior bars. Dense  fractures are again identified demonstrating 2 cortical width posterior displacement, with decreased displacement when compared to prior CT examination of 06/13/2024. Alignment is altered by the fracture and instrumentation. No aggressive appearing osseous lesion. DISCS AND DEGENERATIVE CHANGES: No severe degenerative changes. SOFT TISSUES: Prevertebral soft tissue swelling again noted. The visualized lungs appear clear. FLUOROSCOPY: Fluoroscopy time: 9.1 seconds. Fluoroscopic dose: 1.6 mg. IMPRESSION: 1. Intraoperative posterior fusion with instrumentation of C1 and C2 with bilateral laminar screws and posterior bars. 2. Dens fractures with 2 cortical width posterior displacement, decreased compared to prior CT examination of 06/13/24. 3. Prevertebral soft tissue swelling. Electronically signed by: Dorethia Molt MD 06/14/2024 11:44 PM EST RP Workstation: [id number]   DG C-Arm 1-60 Min-No Report Result Date: 06/14/2024 Fluoroscopy was utilized by the requesting physician.  No radiographic interpretation.   DG C-Arm 1-60 Min-No Report Result Date: 06/14/2024 Fluoroscopy was utilized by the requesting physician.  No radiographic interpretation.   DG C-Arm 1-60 Min-No Report Result Date: 06/14/2024 Fluoroscopy was utilized by the requesting physician.  No radiographic interpretation.   DG O-ARM IMAGE ONLY/NO REPORT Result Date: 06/14/2024 There is no Radiologist interpretation  for this exam.  CT ANGIO HEAD NECK W WO CM Result Date: 06/14/2024 EXAM: CTA HEAD AND NECK WITH AND WITHOUT 06/14/2024 01:13:06 AM TECHNIQUE: CTA of the head and neck was performed with and without the administration of intravenous contrast. Multiplanar 2D and/or 3D reformatted images are provided for review. Automated exposure control, iterative reconstruction, and/or weight based adjustment of the mA/kV was utilized to reduce the radiation dose to as low as reasonably achievable.  Stenosis of the internal carotid arteries  measured using NASCET criteria. COMPARISON: MRI cervical spine from earlier today. CT cervical spine from Jun 13, 2024 CLINICAL HISTORY: Neck trauma, arterial injury suspected FINDINGS: Evaluation at the craniocervical junction is limited by streak artifact. Within this limitation: AORTIC ARCH AND ARCH VESSELS: No dissection or arterial injury. No significant stenosis of the brachiocephalic or subclavian arteries. CERVICAL CAROTID ARTERIES: No dissection, arterial injury, or hemodynamically significant stenosis by NASCET criteria. CERVICAL VERTEBRAL ARTERIES: No dissection, arterial injury, or significant stenosis. LUNGS AND MEDIASTINUM: Chronic bronchiectasis and nodularity in the lung apices, progressed since May 12, 2023 CT Chest. SOFT TISSUES: No acute abnormality. BONES: Known unstable C2 fracture characterized on MRI and CT cervical spine from today and Jun 13, 2024. ANTERIOR CIRCULATION: No significant stenosis of the internal carotid arteries. No significant stenosis of the anterior cerebral arteries. No significant stenosis of the middle cerebral arteries. No aneurysm. POSTERIOR CIRCULATION: No significant stenosis of the posterior cerebral arteries. Moderate basilar artery stenosis. No significant stenosis of the vertebral arteries. No aneurysm. OTHER: No dural venous sinus thrombosis on this non-dedicated study. IMPRESSION: 1. No specific evidence of arterial injury. 2. No large vessel occlusion. 3. Moderate basilar artery stenosis. 4. Chronic bronchiectasis and nodularity in the lung apices, progressed since May 12, 2023 CT Chest. Electronically signed by: Gilmore Molt MD 06/14/2024 01:26 AM EST RP Workstation: [id number]   MR CERVICAL SPINE WO CONTRAST Result Date: 06/14/2024 EXAM: MRI Cervical Spine Without Contrast 06/14/2024 12:19:55 AM TECHNIQUE: Multiplanar multisequence MRI of the cervical spine was performed. COMPARISON: CT cervical spine Jun 13, 2024 CLINICAL HISTORY: Neck trauma,  ligament injury suspected (Age >= 16y) FINDINGS: BONES AND ALIGNMENT: The craniocervical junction is incompletely imaged. Acute dens fracture with approximately 1.3 cm of anterior displacement. The dens tip is posteriorly displaced along with the occiput. The posterior longitudinal ligament is uplifted and thinned. Disruption of the anterior longitudinal ligament at the fracture site (see series 5 iamges 15/16). There is resulting moderate to severe canal stenosis. Fluid/hemorrhage within the fracture cleft. Adjacent prevertebral edema. Perching of the facets better characterized on the CT of the cervical spine. SPINAL CORD: No abnormal spinal cord signal. SOFT TISSUES: No paraspinal mass. C2-C3: Traumatic findings detailed above.  Moderate to severe canal stenosis. C3-C4: Right facet and uncovertebral hypertrophy. Severe right foraminal stenosis. Patent canal and left foramen. C4-C5: Bilateral facet and uncovertebral hypertrophy. Moderate left and mild right foraminal stenosis. C5-C6: Bilateral facet and uncovertebral hypertrophy. Moderate left and mild right foraminal stenosis. C6-C7: Left greater than right facet and uncovertebral hypertrophy. Severe left and mild right foraminal stenosis. C7-T1: Left greater than right facet and uncovertebral hypertrophy. Moderate left foraminal stenosis. IMPRESSION: 1. Unstable dens fracture with 1.3 cm anterior displacement, disrupted anterior longitudinal ligament and thinned/uplifted posterior longitudinal ligament. 2. Resulting moderate to severe  canal stenosis. Normal cord signal. 3. Perched facets better characterized on same day CT of the cervical spine. Electronically signed by: Gilmore Molt MD 06/14/2024 12:58 AM EST RP Workstation: [id number]   CT Head Wo Contrast Result Date: 06/13/2024 EXAM: CT HEAD, FACIAL BONES AND CERVICAL SPINE WITHOUT CONTRAST 06/13/2024 07:30:00 PM TECHNIQUE: CT of the head, facial bones and cervical spine was performed without the  administration of intravenous contrast. Multiplanar reformatted images are provided for review. Automated exposure control, iterative reconstruction, and/or weight based adjustment of the mA/kV was utilized to reduce the radiation dose to as low as reasonably achievable. COMPARISON: Ct cspine 10/02/20 CT chest 05/12/23 CLINICAL HISTORY: Unwitnessed fall,  head injury. FINDINGS: CT HEAD BRAIN AND VENTRICLES: Atherosclerotic calcifications are present within the cavernous internal carotid and vertebral arteries. No acute intracranial hemorrhage. No mass effect or midline shift. No extra-axial fluid collection. No evidence of acute infarct. No hydrocephalus. SKULL AND SCALP: No acute skull fracture. No scalp hematoma. CT FACIAL BONES FACIAL BONES: Acute bilateral nasal bone fractures. No mandibular dislocation. No suspicious bone lesion. ORBITS: No acute traumatic injury. SINUSES AND MASTOIDS: Polypoid-like left maxillary sinus mucosal thickening. The right maxillary sinus mucosal thickening. Right frontal sinus mucosal thickening. SOFT TISSUES: No acute abnormality. CT CERVICAL SPINE BONES AND ALIGNMENT: 1.3 cm anterior displacement of an acute displaced C2 dens fracture with associated perching of the C1 and C2 facets. Associated central canal stenosis at the C1-C2 level. Multilevel moderate degenerative changes of the spine. Associated right severe osseous neural foraminal stenosis at the C3-C4 level. Partial osseous fusion of the C2-C3 levels. DEGENERATIVE CHANGES: No significant degenerative changes. SOFT TISSUES: No prevertebral soft tissue swelling. Cavitary lesions versus bronchiectasis within the right upper lobe with associated nodular opacities As an example, a 1.5x1 cm cavitary lesion (4.99). IMPRESSION: 1. 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 Emergent neurosurgery consultation  recommended. 2. Acute bilateral nasal bone fractures. 3. No acute intracranial abnormality . 4. Cavitary lesions versus bronchiectasis within the right upper lobe. Findings worsened from CT chest 05/12/23 within the right upper lobe. These details were discussed with Dr. darra by Dr. Margarite over the phone on 06/13/2024 at 7:51 pm Electronically signed by: Morgane Naveau MD 06/13/2024 07:59 PM EST RP Workstation: [id number]   CT Cervical Spine Wo Contrast Result Date: 06/13/2024 EXAM: CT HEAD, FACIAL BONES AND CERVICAL SPINE WITHOUT CONTRAST 06/13/2024 07:30:00 PM TECHNIQUE: CT of the head, facial bones and cervical spine was performed without the administration of intravenous contrast. Multiplanar reformatted images are provided for review. Automated exposure control, iterative reconstruction, and/or weight based adjustment of the mA/kV was utilized to reduce the radiation dose to as low as reasonably achievable. COMPARISON: Ct cspine 10/02/20 CT chest 05/12/23 CLINICAL HISTORY: Unwitnessed fall, head injury. FINDINGS: CT HEAD BRAIN AND VENTRICLES: Atherosclerotic calcifications are present within the cavernous internal carotid and vertebral arteries. No acute intracranial hemorrhage. No mass effect or midline shift. No extra-axial fluid collection. No evidence of acute infarct. No hydrocephalus. SKULL AND SCALP: No acute skull fracture. No scalp hematoma. CT FACIAL BONES FACIAL BONES: Acute bilateral nasal bone fractures. No mandibular dislocation. No suspicious bone lesion. ORBITS: No acute traumatic injury. SINUSES AND MASTOIDS: Polypoid-like left maxillary sinus mucosal thickening. The right maxillary sinus mucosal thickening. Right frontal sinus mucosal thickening. SOFT TISSUES: No acute abnormality. CT CERVICAL SPINE BONES AND ALIGNMENT: 1.3 cm anterior displacement of an acute displaced C2 dens fracture with associated perching of the C1 and C2 facets. Associated central canal stenosis at the C1-C2 level.  Multilevel moderate degenerative changes of the spine. Associated right severe osseous neural foraminal stenosis at the C3-C4 level. Partial osseous fusion of the C2-C3 levels. DEGENERATIVE CHANGES: No significant degenerative changes. SOFT TISSUES: No prevertebral soft tissue swelling. Cavitary lesions versus bronchiectasis within the right upper lobe with associated nodular opacities As an example, a 1.5x1 cm cavitary lesion (4.99). IMPRESSION: 1. 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 Emergent neurosurgery  consultation recommended. 2. Acute bilateral nasal bone fractures. 3. No acute intracranial abnormality . 4. Cavitary lesions versus bronchiectasis within the right upper lobe. Findings worsened from CT chest 05/12/23 within the right upper lobe. These details were discussed with Dr. darra by Dr. Margarite over the phone on 06/13/2024 at 7:51 pm Electronically signed by: Morgane Naveau MD 06/13/2024 07:59 PM EST RP Workstation: [id number]   CT Maxillofacial Wo Contrast Result Date: 06/13/2024 EXAM: CT HEAD, FACIAL BONES AND CERVICAL SPINE WITHOUT CONTRAST 06/13/2024 07:30:00 PM TECHNIQUE: CT of the head, facial bones and cervical spine was performed without the administration of intravenous contrast. Multiplanar reformatted images are provided for review. Automated exposure control, iterative reconstruction, and/or weight based adjustment of the mA/kV was utilized to reduce the radiation dose to as low as reasonably achievable. COMPARISON: Ct cspine 10/02/20 CT chest 05/12/23 CLINICAL HISTORY: Unwitnessed fall, head injury. FINDINGS: CT HEAD BRAIN AND VENTRICLES: Atherosclerotic calcifications are present within the cavernous internal carotid and vertebral arteries. No acute intracranial hemorrhage. No mass effect or midline shift. No extra-axial fluid collection. No evidence of acute  infarct. No hydrocephalus. SKULL AND SCALP: No acute skull fracture. No scalp hematoma. CT FACIAL BONES FACIAL BONES: Acute bilateral nasal bone fractures. No mandibular dislocation. No suspicious bone lesion. ORBITS: No acute traumatic injury. SINUSES AND MASTOIDS: Polypoid-like left maxillary sinus mucosal thickening. The right maxillary sinus mucosal thickening. Right frontal sinus mucosal thickening. SOFT TISSUES: No acute abnormality. CT CERVICAL SPINE BONES AND ALIGNMENT: 1.3 cm anterior displacement of an acute displaced C2 dens fracture with associated perching of the C1 and C2 facets. Associated central canal stenosis at the C1-C2 level. Multilevel moderate degenerative changes of the spine. Associated right severe osseous neural foraminal stenosis at the C3-C4 level. Partial osseous fusion of the C2-C3 levels. DEGENERATIVE CHANGES: No significant degenerative changes. SOFT TISSUES: No prevertebral soft tissue swelling. Cavitary lesions versus bronchiectasis within the right upper lobe with associated nodular opacities As an example, a 1.5x1 cm cavitary lesion (4.99). IMPRESSION: 1. 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 Emergent neurosurgery consultation recommended. 2. Acute bilateral nasal bone fractures. 3. No acute intracranial abnormality . 4. Cavitary lesions versus bronchiectasis within the right upper lobe. Findings worsened from CT chest 05/12/23 within the right upper lobe. These details were discussed with Dr. darra by Dr. Margarite over the phone on 06/13/2024 at 7:51 pm Electronically signed by: Morgane Naveau MD 06/13/2024 07:59 PM EST RP Workstation: [id number]    Microbiology: Results for orders placed or performed during the hospital encounter of 06/13/24  Surgical pcr screen     Status: None   Collection Time: 06/14/24  8:21 AM   Specimen: Nasal Mucosa;  Nasal Swab  Result Value Ref Range Status   MRSA, PCR NEGATIVE NEGATIVE Final   Staphylococcus aureus NEGATIVE NEGATIVE Final    Comment: (NOTE) The Xpert SA Assay (FDA approved for NASAL specimens in patients 59 years of age and older), is one component of a comprehensive surveillance program. It is not intended to diagnose infection nor to guide or monitor treatment. Performed at [facility], [address], [place], [place] [address]     Labs: CBC: Recent Labs  Lab 06/13/24 1848 06/14/24 0404 06/15/24 0516 06/16/24 0437 06/17/24 0628  WBC 11.5* 14.8* 17.6* 15.9* 10.8*  NEUTROABS 8.9* 12.3*  --   --   --   HGB  13.2 13.2 13.5 12.2* 13.1  HCT 39.7 39.0 39.1 35.4* 37.8*  MCV 96.6 96.3 94.4 94.1 93.8  PLT 235 236 203 214 208   Basic Metabolic Panel: Recent Labs  Lab 06/13/24 1848 06/14/24 0404 06/15/24 0516 06/16/24 0437 06/17/24 0628  NA 133* 132* 132* 128* 129*  K 3.7 4.5 4.3 3.7 3.9  CL 100 98 98 96* 98  CO2 23 23 22 22  20*  GLUCOSE 128* 131* 123* 108* 102*  BUN 10 10 8 10 10   CREATININE 0.79 0.89 0.84 0.86 0.63  CALCIUM 7.9* 8.4* 8.3* 8.1* 8.4*  MG  --  2.1  --   --   --    Liver Function Tests: Recent Labs  Lab 06/13/24 1848 06/14/24 0404 06/15/24 0516  AST 22 20 27   ALT 12 12 12   ALKPHOS 94 97 95  BILITOT 0.6 0.7 1.3*  PROT 6.2* 6.8 6.1*  ALBUMIN 3.1* 3.2* 2.9*   CBG: Recent Labs  Lab 06/15/24 1424  GLUCAP 129*    Discharge time spent: greater than 30 minutes.  Signed: [name], MD [facility] 06/17/2024

## 2024-06-17 NOTE — Plan of Care (Signed)
  Problem: Education: Goal: Knowledge of General Education information will improve Description Including pain rating scale, medication(s)/side effects and non-pharmacologic comfort measures Outcome: Adequate for Discharge   Problem: Activity: Goal: Risk for activity intolerance will decrease Outcome: Adequate for Discharge   Problem: Nutrition: Goal: Adequate nutrition will be maintained Outcome: Adequate for Discharge   

## 2024-06-17 NOTE — Progress Notes (Signed)
    Providing Compassionate, Quality Care - Together   NEUROSURGERY PROGRESS NOTE     S: NAEs o/n.    O: EXAM:  BP 132/68 (BP Location: Left Arm)   Pulse 67   Temp (!) 97.5 F (36.4 C)   Resp 16   Ht (P) 6' 2.5 (1.892 m)   Wt (P) 81.6 kg   SpO2 98%   BMI (P) 22.80 kg/m     Awake, alert, pleasantly confused MAE Incision c/d/I.    ASSESSMENT:  89 y.o. with high functioning recent diagnosis of dementia who presents after ground-level fall and imaging showing severely displaced odontoid fracture without neurologic injury. Underwent posterior C1-2 fusion on 11/19     PLAN: -AAT, no collar needed -DAT -Please discharge patient with oxycodone  and flexeril . Our office can provide refills once he leaves as an outpatient -Daughters requesting to restart home medications -Medically clear for discharge from nsgy standpoint -Call w/ questions/concerns.   Camie Pickle, [facility]

## 2024-06-18 DIAGNOSIS — S12100B Unspecified displaced fracture of second cervical vertebra, initial encounter for open fracture: Secondary | ICD-10-CM

## 2024-06-18 DIAGNOSIS — E871 Hypo-osmolality and hyponatremia: Secondary | ICD-10-CM

## 2024-06-18 DIAGNOSIS — K5901 Slow transit constipation: Secondary | ICD-10-CM

## 2024-06-18 LAB — COMPREHENSIVE METABOLIC PANEL WITH GFR
ALT: 28 U/L (ref 0–44)
AST: 34 U/L (ref 15–41)
Albumin: 2.7 g/dL — ABNORMAL LOW (ref 3.5–5.0)
Alkaline Phosphatase: 89 U/L (ref 38–126)
Anion gap: 9 (ref 5–15)
BUN: 10 mg/dL (ref 8–23)
CO2: 24 mmol/L (ref 22–32)
Calcium: 8.6 mg/dL — ABNORMAL LOW (ref 8.9–10.3)
Chloride: 96 mmol/L — ABNORMAL LOW (ref 98–111)
Creatinine, Ser: 0.77 mg/dL (ref 0.61–1.24)
GFR, Estimated: >60 mL/min (ref >60)
Glucose, Bld: 92 mg/dL (ref 70–99)
Potassium: 4 mmol/L (ref 3.5–5.1)
Sodium: 129 mmol/L — ABNORMAL LOW (ref 135–145)
Total Bilirubin: 1.1 mg/dL (ref 0.0–1.2)
Total Protein: 6.4 g/dL — ABNORMAL LOW (ref 6.5–8.1)

## 2024-06-18 LAB — CBC WITH DIFFERENTIAL/PLATELET
Abs Immature Granulocytes: 0.04 K/uL (ref 0.00–0.07)
Basophils Absolute: 0 K/uL (ref 0.0–0.1)
Basophils Relative: 0 %
Eosinophils Absolute: 0.2 K/uL (ref 0.0–0.5)
Eosinophils Relative: 2 %
HCT: 36 % — ABNORMAL LOW (ref 39.0–52.0)
Hemoglobin: 12.7 g/dL — ABNORMAL LOW (ref 13.0–17.0)
Immature Granulocytes: 0 %
Lymphocytes Relative: 13 %
Lymphs Abs: 1.4 K/uL (ref 0.7–4.0)
MCH: 32.5 pg (ref 26.0–34.0)
MCHC: 35.3 g/dL (ref 30.0–36.0)
MCV: 92.1 fL (ref 80.0–100.0)
Monocytes Absolute: 1.2 K/uL — ABNORMAL HIGH (ref 0.1–1.0)
Monocytes Relative: 11 %
Neutro Abs: 7.7 K/uL (ref 1.7–7.7)
Neutrophils Relative %: 74 %
Platelets: 252 K/uL (ref 150–400)
RBC: 3.91 MIL/uL — ABNORMAL LOW (ref 4.22–5.81)
RDW: 12.7 % (ref 11.5–15.5)
WBC: 10.6 K/uL — ABNORMAL HIGH (ref 4.0–10.5)
nRBC: 0 % (ref 0.0–0.2)

## 2024-06-18 MED ORDER — DOCUSATE SODIUM 100 MG PO CAPS
200.0000 mg | ORAL_CAPSULE | Freq: Every day | ORAL | Status: DC
  Administered 2024-06-18 – 2024-06-26 (×9): 200 mg via ORAL
  Filled 2024-06-18 (×10): qty 2

## 2024-06-18 NOTE — Progress Notes (Addendum)
 PROGRESS NOTE   Subjective/Complaints:  Pt doing well, slept ok (he doesn't sleep much at baseline). Pain manageable overall. LBM 2 days ago per pt, states this is normal for him. Urinating fine per pt, mostly continent per documentation. No other complaints or concerns, pt might be inconsistent historian though.   ROS: as per HPI. Denies CP, SOB, abd pain, N/V/D, or any other complaints at this time.    Objective:   ECHOCARDIOGRAM COMPLETE Result Date: 06/16/2024    ECHOCARDIOGRAM REPORT   Patient Name:   Adrian Ray Date of Exam: 06/16/2024 Medical Rec #:  [id number]   Height:       74.5 in Accession #:    [id number]  Weight:       180.0 lb Date of Birth:  02-Oct-1933   BSA:          2.088 m Patient Age:    89 years    BP:           129/63 mmHg Patient Gender: M           HR:           70 bpm. Exam Location:  Inpatient Procedure: 2D Echo, Cardiac Doppler and Color Doppler (Both Spectral and Color            Flow Doppler were utilized during procedure). Indications:    Syncope  History:        Patient has no prior history of Echocardiogram examinations.                 COPD; Risk Factors:Dyslipidemia.  Sonographer:    Sherlean Dubin Referring Phys: MILBERT SABAS RAMAN LAMA  Sonographer Comments: Image acquisition challenging due to respiratory motion. IMPRESSIONS  1. Left ventricular ejection fraction, by estimation, is 60 to 65%. The left ventricle has normal function. The left ventricle has no regional wall motion abnormalities. Left ventricular diastolic parameters were normal.  2. Right ventricular systolic function is normal. The right ventricular size is normal.  3. The mitral valve is normal in structure. No evidence of mitral valve regurgitation. No evidence of mitral stenosis.  4. The aortic valve is tricuspid. There is mild calcification of the aortic valve. There is mild thickening of the aortic valve. Aortic valve regurgitation is not  visualized. Aortic valve sclerosis is present, with no evidence of aortic valve stenosis.  5. The inferior vena cava is normal in size with greater than 50% respiratory variability, suggesting right atrial pressure of 3 mmHg. FINDINGS  Left Ventricle: Left ventricular ejection fraction, by estimation, is 60 to 65%. The left ventricle has normal function. The left ventricle has no regional wall motion abnormalities. Strain was performed and the global longitudinal strain is indeterminate. The left ventricular internal cavity size was normal in size. There is no left ventricular hypertrophy. Left ventricular diastolic parameters were normal. Right Ventricle: The right ventricular size is normal. No increase in right ventricular wall thickness. Right ventricular systolic function is normal. Left Atrium: Left atrial size was normal in size. Right Atrium: Right atrial size was normal in size. Pericardium: There is no evidence of pericardial effusion. Mitral Valve: The mitral valve is normal in  structure. No evidence of mitral valve regurgitation. No evidence of mitral valve stenosis. Tricuspid Valve: The tricuspid valve is normal in structure. Tricuspid valve regurgitation is not demonstrated. No evidence of tricuspid stenosis. Aortic Valve: The aortic valve is tricuspid. There is mild calcification of the aortic valve. There is mild thickening of the aortic valve. Aortic valve regurgitation is not visualized. Aortic valve sclerosis is present, with no evidence of aortic valve stenosis. Pulmonic Valve: The pulmonic valve was normal in structure. Pulmonic valve regurgitation is not visualized. No evidence of pulmonic stenosis. Aorta: The aortic root is normal in size and structure. Venous: The inferior vena cava is normal in size with greater than 50% respiratory variability, suggesting right atrial pressure of 3 mmHg. IAS/Shunts: No atrial level shunt detected by color flow Doppler. Additional Comments: 3D was performed  not requiring image post processing on an independent workstation and was indeterminate.  LEFT VENTRICLE PLAX 2D LVIDd:         4.00 cm   Diastology LVIDs:         2.70 cm   LV e' medial:    10.00 cm/s LV PW:         1.00 cm   LV E/e' medial:  5.9 LV IVS:        0.90 cm   LV e' lateral:   12.70 cm/s LVOT diam:     2.30 cm   LV E/e' lateral: 4.6 LV SV:         84 LV SV Index:   40 LVOT Area:     4.15 cm  RIGHT VENTRICLE             IVC RV Basal diam:  3.50 cm     IVC diam: 1.60 cm RV Mid diam:    2.50 cm RV S prime:     11.60 cm/s TAPSE (M-mode): 1.8 cm LEFT ATRIUM           Index        RIGHT ATRIUM           Index LA diam:      2.50 cm 1.20 cm/m   RA Area:     13.60 cm LA Vol (A2C): 27.2 ml 13.03 ml/m  RA Volume:   26.40 ml  12.64 ml/m LA Vol (A4C): 26.5 ml 12.69 ml/m  AORTIC VALVE LVOT Vmax:   92.80 cm/s LVOT Vmean:  60.800 cm/s LVOT VTI:    0.201 m  AORTA Ao Root diam: 2.40 cm MITRAL VALVE MV Area (PHT): 2.72 cm    SHUNTS MV Decel Time: 279 msec    Systemic VTI:  0.20 m MV E velocity: 58.70 cm/s  Systemic Diam: 2.30 cm MV A velocity: 71.80 cm/s MV E/A ratio:  0.82 Maude Emmer MD Electronically signed by Maude Emmer MD Signature Date/Time: 06/16/2024/2:10:37 PM    Final    Recent Labs    06/17/24 0628 06/18/24 0459  WBC 10.8* 10.6*  HGB 13.1 12.7*  HCT 37.8* 36.0*  PLT 208 252   Recent Labs    06/17/24 0628 06/18/24 0459  NA 129* 129*  K 3.9 4.0  CL 98 96*  CO2 20* 24  GLUCOSE 102* 92  BUN 10 10  CREATININE 0.63 0.77  CALCIUM 8.4* 8.6*        Intake/Output Summary (Last 24 hours) at 06/18/2024 0947 Last data filed at 06/18/2024 0521 Gross per 24 hour  Intake --  Output 1000 ml  Net -1000 ml  Physical Exam: Vital Signs Blood pressure 137/72, pulse 65, temperature 97.6 F (36.4 C), temperature source Oral, resp. rate 16, height 6' 2.5 (1.892 m), weight 79.2 kg, SpO2 98%.    General: No acute distress, sitting up in bed eating breakfast Mood and affect are  appropriate HEENT: abrasions to face, HOH? Hearing aids donned.  Heart: Regular rate and rhythm no rubs murmurs or extra sounds Lungs: Clear to auscultation, breathing unlabored, no rales or wheezes Abdomen: Positive bowel sounds, soft nontender to palpation, nondistended Extremities: No clubbing, cyanosis, or edema Skin: No evidence of breakdown, no evidence of rash, cervical incision CDI and covered, bilateral knee abrasions multiple some epithelialized some not, no lacerations Neuro: alert, either HOH or perhaps some cognitive slowness/delayed processing. Follows commands.   PRIOR EXAMS: Neurologic: motor strength is 4/5 in bilateral deltoid, bicep, tricep, grip, hip flexor, knee extensors, ankle dorsiflexor and plantar flexor Sensory exam normal sensation to light touch in bilateral upper and lower extremities Cerebellar exam normal finger to nose to finger  Musculoskeletal: Full range of motion in all 4 extremities. No joint swelling  Assessment/Plan: 1. Functional deficits which require 3+ hours per day of interdisciplinary therapy in a comprehensive inpatient rehab setting. Physiatrist is providing close team supervision and 24 hour management of active medical problems listed below. Physiatrist and rehab team continue to assess barriers to discharge/monitor patient progress toward functional and medical goals  Care Tool:  Bathing              Bathing assist       Upper Body Dressing/Undressing Upper body dressing        Upper body assist      Lower Body Dressing/Undressing Lower body dressing            Lower body assist       Toileting Toileting    Toileting assist       Transfers Chair/bed transfer  Transfers assist     Chair/bed transfer assist level: Moderate Assistance - Patient 50 - 74%     Locomotion Ambulation   Ambulation assist              Walk 10 feet activity   Assist           Walk 50 feet activity   Assist            Walk 150 feet activity   Assist           Walk 10 feet on uneven surface  activity   Assist           Wheelchair     Assist               Wheelchair 50 feet with 2 turns activity    Assist            Wheelchair 150 feet activity     Assist          Blood pressure 137/72, pulse 65, temperature 97.6 F (36.4 C), temperature source Oral, resp. rate 16, height 6' 2.5 (1.892 m), weight 79.2 kg, SpO2 98%.  Medical Problem List and Plan: 1. Functional deficits secondary to odontoid fracture, NO COLLAR NEEDED per nsgy             -patient may  shower if incision is covered             -ELOS/Goals: 5-7d Sup/minA  -Continue CIR   2.  Antithrombotics: -DVT/anticoagulation:  Mechanical: Sequential compression devices, below knee Bilateral  lower extremities             -antiplatelet therapy: N/A  3. Pain Management: Oxycodone  prn. Tylenol  prn  4. Mood/Behavior/Sleep: LCSW to follow for evaluation and support             --Melatonin prn for sleep.              -antipsychotic agents: N/A  5. Neuropsych/cognition: This patient appears to be capable of making decisions on his own behalf. -06/18/24 pt HOH but also ?cognitively slow vs having processing delays-- not sure if SLP consult might be helpful?  6. Skin/Wound Care: routine pressure relief measures and skin care for wounds. Neosporin ordered.  -06/18/24 daughter wants WOC to see him about his abrasions; defer to weekday team, also note that his sutures on his face were put in on 11/19, check for readiness to remove on 06/21/24   7. Fluids/Electrolytes/Nutrition: Monitor I/O and routine labs, continue vitamins/supplements.   -Hyponatremia as below #11  8. Chronic rhinitis/allergies: Has chronic cough a/w this --continue Flonase  and Breo inhaler.    9. BPH: Managed with Proscar  5mg  daily  10. Alzheimer's dementia: Per family decline over  past couple of years. Tends to repeat  himself             --very HOH. Dry erase board for communication needs.    -see #5  11. Acute on chronic hyponatremia: Baseline Na-132-->128             --Question SIADH v/s cerebral salt wasting.   -06/18/24 Na 129, stable, monitor twice weekly.    12. Constipation: 06/18/24 no BM in 2 days, normal for him, consider med adjustment if continued. Has PRNs but not sure he'd ask for them. -ADDENDUM: daughter mentioned it has actually been since surgery day that he had a BM, added colace 200mg  daily   13. Leukocytosis: gradually improving 17.6>15.9>10.8>10.6. Likely postoperative. No s/sx of infection, monitor for changes. Monitor twice weekly.     LOS: 1 days A FACE TO FACE EVALUATION WAS PERFORMED  [address] 06/18/2024, 9:47 AM

## 2024-06-18 NOTE — Evaluation (Signed)
 Physical Therapy Assessment and Plan  Patient Details  Name: Adrian Ray MRN: [id number] Date of Birth: July 23, 1934  PT Diagnosis: Abnormal posture, Abnormality of gait, Cognitive deficits, Difficulty walking, and Muscle weakness Rehab Potential: Fair ELOS: 7-12 days   Today's Date: 06/18/2024 PT Individual Time: 1400-1457 PT Individual Time Calculation (min): 57 min    Hospital Problem: Principal Problem:   Odontoid fracture (HCC)   Past Medical History:  Past Medical History:  Diagnosis Date   BPH (benign prostatic hyperplasia)    Cataracts, bilateral    Clavicle fracture    left   COPD (chronic obstructive pulmonary disease) (HCC)    COVID-19    Dementia (HCC)    Deviated septum    GERD (gastroesophageal reflux disease)    Hyperlipemia    Moderately severe hearing loss    Pulmonary nodule    Tinnitus    Past Surgical History:  Past Surgical History:  Procedure Laterality Date   APPENDECTOMY     POSTERIOR CERVICAL FUSION/FORAMINOTOMY N/A 06/14/2024   Procedure: POSTERIOR CERVICAL FUSION/FORAMINOTOMY CERVICAL ONE- TWO;  Surgeon: Darnella Dorn SAUNDERS, MD;  Location: [facility] OR;  Service: Neurosurgery;  Laterality: N/A;  POSTERIOR CERVICAL FUSION/ C1-C2   SHOULDER SURGERY Right    2003    Assessment & Plan Clinical Impression: Patient is a 89 y.o. year old male with history of COPD, bronchiectasis, BPH, B 12 deficiency, recent left clavicle Fx 6 weeks ago, Alzheimer's dementia who was admitted from his ILF after unwitnessed fall onto face with reports of neck pain and had multiple abrasions on BUE/BLE and face. He reported that he was walking when he felt weak, bent over and then fell onto his face. 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 He was taken to OR for C1-C2 segmental instrumentation and fusion with reduction of C2 Fx by Dr. Darnella. Post op PT/OT evaluations done and patient noted to be requiring  min assist for mobility with BUE support, has balance deficits as well as hearing loss with ability to follow simple commands with increased time. He is limited by neck pain ad needs communication board for conversation. He was independent PTA and therapy/family requested CIR due to functional decline.    Patient currently requires mod with mobility secondary to muscle weakness and muscle joint tightness, decreased cardiorespiratoy endurance, unbalanced muscle activation and decreased coordination, and decreased sitting balance, decreased standing balance, decreased postural control, decreased balance strategies, and difficulty maintaining precautions.  Prior to hospitalization, patient was modified independent  with mobility and lived with Alone in a Independent living facility home.  Home access is  Level entry.  Patient will benefit from skilled PT intervention to maximize safe functional mobility, minimize fall risk, and decrease caregiver burden for planned discharge home with 24 hour supervision.  Anticipate patient will benefit from follow up HH at discharge.  PT - End of Session Activity Tolerance: Tolerates 30+ min activity with multiple rests Endurance Deficit: Yes PT Assessment Rehab Potential (ACUTE/IP ONLY): Fair PT Patient demonstrates impairments in the following area(s): Balance;Endurance;Motor;Pain;Perception;Safety PT Transfers Functional Problem(s): Bed Mobility;Bed to Chair;Car PT Locomotion Functional Problem(s): Ambulation;Stairs PT Plan PT Intensity: Minimum of 1-2 x/day ,45 to 90 minutes PT Frequency: 5 out of 7 days PT Duration Estimated Length of Stay: 7-12 days PT Treatment/Interventions: Cognitive remediation/compensation;Ambulation/gait training;Discharge planning;DME/adaptive equipment instruction;Functional mobility training;Pain management;Psychosocial support;Splinting/orthotics;Therapeutic Activities;UE/LE Strength taining/ROM;Visual/perceptual  remediation/compensation;Balance/vestibular training;Community reintegration;Disease management/prevention;Functional electrical stimulation;Neuromuscular re-education;Patient/family education;Skin care/wound management;Stair training;Therapeutic Exercise;UE/LE Coordination  activities;Wheelchair propulsion/positioning PT Transfers Anticipated Outcome(s): supervision PT Locomotion Anticipated Outcome(s): supervision PT Recommendation Recommendations for Other Services: Neuropsych consult Follow Up Recommendations: Home health PT Patient destination: Home Equipment Recommended: To be determined   PT Evaluation Precautions/Restrictions Precautions Precautions: Fall;Cervical Recall of Precautions/Restrictions: Impaired Restrictions Weight Bearing Restrictions Per Provider Order: No Other Position/Activity Restrictions: cervical; no pushing/pulling lifting more than 5-10 lbs Pain Interference Pain Interference Pain Effect on Sleep: 3. Frequently Pain Interference with Therapy Activities: 1. Rarely or not at all Pain Interference with Day-to-Day Activities: 1. Rarely or not at all Home Living/Prior Functioning Home Living Type of Home: Independent living facility Home Access: Level entry Home Layout: One level Bathroom Shower/Tub: Health Visitor: Standard Additional Comments: [place]- wife is in the SNF and reports has been for the last 6 years  Lives With: Alone Prior Function Level of Independence: Independent with basic ADLs;Independent with transfers;Independent with homemaking with ambulation;Independent with gait  Able to Take Stairs?: Yes Driving: No Vocation: Retired Vision/Perception  Vision - History Ability to See in Adequate Light: 0 Adequate Perception Perception: Within Functional Limits Praxis Praxis: WFL  Cognition Overall Cognitive Status: Impaired/Different from baseline (baseline dementia) Arousal/Alertness: Awake/alert Orientation  Level: Oriented to person;Oriented to place;Oriented to situation Attention: Focused;Sustained Focused Attention: Appears intact Sustained Attention: Appears intact Memory: Impaired Memory Impairment: Decreased recall of new information;Decreased short term memory Awareness: Impaired Awareness Impairment: Anticipatory impairment Safety/Judgment: Appears intact Sensation Sensation Light Touch: Appears Intact Proprioception: Appears Intact Coordination Gross Motor Movements are Fluid and Coordinated: No Fine Motor Movements are Fluid and Coordinated: Yes Coordination and Movement Description: LEs decr coordination Motor  Motor Motor: Abnormal postural alignment and control Motor - Skilled Clinical Observations: generalized weakness; acute pain in teh neck  Trunk/Postural Assessment  Cervical Assessment Cervical Assessment: Exceptions to [facility] (cervical precautions) Thoracic Assessment Thoracic Assessment: Within Functional Limits Lumbar Assessment Lumbar Assessment: Exceptions to [facility] (posteiror pelvic tilt) Postural Control Postural Control: Deficits on evaluation Trunk Control: posterior bias Righting Reactions: delayed Protective Responses: delayed  Balance Balance Balance Assessed: Yes Static Sitting Balance Static Sitting - Balance Support: Feet supported;No upper extremity supported Static Sitting - Level of Assistance: 5: Stand by assistance Dynamic Sitting Balance Dynamic Sitting - Balance Support: During functional activity Dynamic Sitting - Level of Assistance: 4: Min assist Static Standing Balance Static Standing - Balance Support: During functional activity Static Standing - Level of Assistance: 4: Min assist;3: Mod assist Dynamic Standing Balance Dynamic Standing - Balance Support: During functional activity Dynamic Standing - Level of Assistance: 3: Mod assist Extremity Assessment      RLE Assessment RLE Assessment: Exceptions to [facility] General Strength  Comments: not formally assessed, grossly 3+/5 LLE Assessment LLE Assessment: Exceptions to [facility] General Strength Comments: not formally assessed, grossly 3+/5  Care Tool Care Tool Bed Mobility Roll left and right activity   Roll left and right assist level: Minimal Assistance - Patient > 75%    Sit to lying activity   Sit to lying assist level: Minimal Assistance - Patient > 75%    Lying to sitting on side of bed activity   Lying to sitting on side of bed assist level: the ability to move from lying on the back to sitting on the side of the bed with no back support.: Minimal Assistance - Patient > 75%     Care Tool Transfers Sit to stand transfer   Sit to stand assist level: Moderate Assistance - Patient 50 - 74%  Chair/bed transfer   Chair/bed transfer assist level: Moderate Assistance - Patient 50 - 74%    Car transfer   Car transfer assist level: Moderate Assistance - Patient 50 - 74%      Care Tool Locomotion Ambulation   Assist level: Minimal Assistance - Patient > 75% Assistive device: Walker-rolling Max distance: 90  Walk 10 feet activity   Assist level: Minimal Assistance - Patient > 75% Assistive device: Walker-rolling   Walk 50 feet with 2 turns activity   Assist level: Minimal Assistance - Patient > 75% Assistive device: Walker-rolling  Walk 150 feet activity Walk 150 feet activity did not occur: Safety/medical concerns (fatigue)      Walk 10 feet on uneven surfaces activity   Assist level: Minimal Assistance - Patient > 75% Assistive device: Walker-rolling  Stairs   Assist level: Minimal Assistance - Patient > 75% Stairs assistive device: 2 hand rails Max number of stairs: 4  Walk up/down 1 step activity   Walk up/down 1 step (curb) assist level: Minimal Assistance - Patient > 75% Walk up/down 1 step or curb assistive device: 2 hand rails  Walk up/down 4 steps activity   Walk up/down 4 steps assist level: Minimal Assistance - Patient > 75% Walk  up/down 4 steps assistive device: 2 hand rails  Walk up/down 12 steps activity Walk up/down 12 steps activity did not occur: Safety/medical concerns      Pick up small objects from floor   Pick up small object from the floor assist level: Total Assistance - Patient < 25%    Wheelchair Is the patient using a wheelchair?: Yes Type of Wheelchair: Manual   Wheelchair assist level: Dependent - Patient 0%    Wheel 50 feet with 2 turns activity      Wheel 150 feet activity   Assist Level: Dependent - Patient 0%    Refer to Care Plan for Long Term Goals  SHORT TERM GOAL WEEK 1 PT Short Term Goal 1 (Week 1): pt will perorm sit to stand with LRAD and CGA PT Short Term Goal 2 (Week 1): pt will perform bed to chair tansfer with LRAD and CGA PT Short Term Goal 3 (Week 1): Pt will ambulated 150 feet with LRAD and CGA  Recommendations for other services: Neuropsych  Skilled Therapeutic Intervention Mobility Bed Mobility Bed Mobility: Rolling Right;Rolling Left;Sit to Supine;Supine to Sit Rolling Right: Minimal Assistance - Patient > 75% Rolling Left: Minimal Assistance - Patient > 75% Supine to Sit: Minimal Assistance - Patient > 75% Sit to Supine: Minimal Assistance - Patient > 75% Transfers Transfers: Sit to Stand;Stand to Sit;Stand Pivot Transfers Sit to Stand: Moderate Assistance - Patient 50-74% Stand to Sit: Moderate Assistance - Patient 50-74% Stand Pivot Transfers: Moderate Assistance - Patient 50 - 74% Transfer (Assistive device): Rolling walker Locomotion  Gait Ambulation: Yes Gait Assistance: Minimal Assistance - Patient > 75% Gait Distance (Feet): 90 Feet Assistive device: Rolling walker Gait Assistance Details: Verbal cues for gait pattern;Verbal cues for precautions/safety;Verbal cues for safe use of DME/AE Gait Gait: Yes Gait Pattern: Impaired Gait Pattern: Decreased step length - right;Decreased step length - left;Step-to pattern;Decreased stride length;Trunk  flexed Gait velocity: decreased Stairs / Additional Locomotion Stairs: Yes Stairs Assistance: Minimal Assistance - Patient > 75% Stair Management Technique: Two rails;Step to pattern Number of Stairs: 4 Height of Stairs: 6 Ramp: Minimal Assistance - Patient >75% Wheelchair Mobility Wheelchair Mobility: Yes Wheelchair Assistance: Dependent - Patient 0%   Discharge Criteria: Patient will be discharged  from PT if patient refuses treatment 3 consecutive times without medical reason, if treatment goals not met, if there is a change in medical status, if patient makes no progress towards goals or if patient is discharged from hospital.  The above assessment, treatment plan, treatment alternatives and goals were discussed and mutually agreed upon: by patient  Today's Interventions  Evaluation completed (see details above and below) with education on PT POC and goals and individual treatment initiated with focus on transfer training.   Bed mobility: min A Sit to stand/stand pivot transfer mod A 2/2 posterior bias, verbal cues provided for UE posiitoning for power up and anterior weight shift.  Gait x90 feet with RW and min A for mangement of RW, verbal cues provided for safety with RW and reciprocal gait Stair navigation x4 6 inch steps with min A with B handrail and step to gait, verbal cues provided for B LE positioning.  Pt performed bed mobility on rehab apartment bed with min A   Pt supine in bed with all needs within reach and bed alarm on.    [facility], [place], DPT  06/18/2024, 4:00 PM

## 2024-06-18 NOTE — Plan of Care (Signed)
  Problem: RH Balance Goal: LTG Patient will maintain dynamic standing with ADLs (OT) Description: LTG:  Patient will maintain dynamic standing balance with assist during activities of daily living (OT)  Flowsheets (Taken 06/18/2024 1157) LTG: Pt will maintain dynamic standing balance during ADLs with: Supervision/Verbal cueing   Problem: Sit to Stand Goal: LTG:  Patient will perform sit to stand in prep for activites of daily living with assistance level (OT) Description: LTG:  Patient will perform sit to stand in prep for activites of daily living with assistance level (OT) Flowsheets (Taken 06/18/2024 1157) LTG: PT will perform sit to stand in prep for activites of daily living with assistance level: Supervision/Verbal cueing   Problem: RH Bathing Goal: LTG Patient will bathe all body parts with assist levels (OT) Description: LTG: Patient will bathe all body parts with assist levels (OT) Flowsheets (Taken 06/18/2024 1157) LTG: Pt will perform bathing with assistance level/cueing: Supervision/Verbal cueing   Problem: RH Dressing Goal: LTG Patient will perform upper body dressing (OT) Description: LTG Patient will perform upper body dressing with assist, with/without cues (OT). Flowsheets (Taken 06/18/2024 1157) LTG: Pt will perform upper body dressing with assistance level of: Independent with assistive device Goal: LTG Patient will perform lower body dressing w/assist (OT) Description: LTG: Patient will perform lower body dressing with assist, with/without cues in positioning using equipment (OT) Flowsheets (Taken 06/18/2024 1157) LTG: Pt will perform lower body dressing with assistance level of: Supervision/Verbal cueing   Problem: RH Toileting Goal: LTG Patient will perform toileting task (3/3 steps) with assistance level (OT) Description: LTG: Patient will perform toileting task (3/3 steps) with assistance level (OT)  Flowsheets (Taken 06/18/2024 1157) LTG: Pt will perform  toileting task (3/3 steps) with assistance level: Supervision/Verbal cueing   Problem: RH Toilet Transfers Goal: LTG Patient will perform toilet transfers w/assist (OT) Description: LTG: Patient will perform toilet transfers with assist, with/without cues using equipment (OT) Flowsheets (Taken 06/18/2024 1157) LTG: Pt will perform toilet transfers with assistance level of: Supervision/Verbal cueing   Problem: RH Tub/Shower Transfers Goal: LTG Patient will perform tub/shower transfers w/assist (OT) Description: LTG: Patient will perform tub/shower transfers with assist, with/without cues using equipment (OT) Flowsheets (Taken 06/18/2024 1157) LTG: Pt will perform tub/shower stall transfers with assistance level of: Supervision/Verbal cueing

## 2024-06-18 NOTE — Progress Notes (Deleted)
 PROGRESS NOTE   Subjective/Complaints:  Pt doing well, slept ok (he doesn't sleep much at baseline). Pain manageable overall. LBM 2 days ago per pt, states this is normal for him. Urinating fine per pt, mostly continent per documentation. No other complaints or concerns, pt might be inconsistent historian though.   ROS: as per HPI. Denies CP, SOB, abd pain, N/V/D, or any other complaints at this time.    Objective:   ECHOCARDIOGRAM COMPLETE Result Date: 06/16/2024    ECHOCARDIOGRAM REPORT   Patient Name:   Adrian Ray Date of Exam: 06/16/2024 Medical Rec #:  [id number]   Height:       74.5 in Accession #:    [id number]  Weight:       180.0 lb Date of Birth:  Apr 29, 1934   BSA:          2.088 m Patient Age:    89 years    BP:           129/63 mmHg Patient Gender: M           HR:           70 bpm. Exam Location:  Inpatient Procedure: 2D Echo, Cardiac Doppler and Color Doppler (Both Spectral and Color            Flow Doppler were utilized during procedure). Indications:    Syncope  History:        Patient has no prior history of Echocardiogram examinations.                 COPD; Risk Factors:Dyslipidemia.  Sonographer:    Sherlean Dubin Referring Phys: MILBERT SABAS RAMAN LAMA  Sonographer Comments: Image acquisition challenging due to respiratory motion. IMPRESSIONS  1. Left ventricular ejection fraction, by estimation, is 60 to 65%. The left ventricle has normal function. The left ventricle has no regional wall motion abnormalities. Left ventricular diastolic parameters were normal.  2. Right ventricular systolic function is normal. The right ventricular size is normal.  3. The mitral valve is normal in structure. No evidence of mitral valve regurgitation. No evidence of mitral stenosis.  4. The aortic valve is tricuspid. There is mild calcification of the aortic valve. There is mild thickening of the aortic valve. Aortic valve regurgitation is not  visualized. Aortic valve sclerosis is present, with no evidence of aortic valve stenosis.  5. The inferior vena cava is normal in size with greater than 50% respiratory variability, suggesting right atrial pressure of 3 mmHg. FINDINGS  Left Ventricle: Left ventricular ejection fraction, by estimation, is 60 to 65%. The left ventricle has normal function. The left ventricle has no regional wall motion abnormalities. Strain was performed and the global longitudinal strain is indeterminate. The left ventricular internal cavity size was normal in size. There is no left ventricular hypertrophy. Left ventricular diastolic parameters were normal. Right Ventricle: The right ventricular size is normal. No increase in right ventricular wall thickness. Right ventricular systolic function is normal. Left Atrium: Left atrial size was normal in size. Right Atrium: Right atrial size was normal in size. Pericardium: There is no evidence of pericardial effusion. Mitral Valve: The mitral valve is normal in  structure. No evidence of mitral valve regurgitation. No evidence of mitral valve stenosis. Tricuspid Valve: The tricuspid valve is normal in structure. Tricuspid valve regurgitation is not demonstrated. No evidence of tricuspid stenosis. Aortic Valve: The aortic valve is tricuspid. There is mild calcification of the aortic valve. There is mild thickening of the aortic valve. Aortic valve regurgitation is not visualized. Aortic valve sclerosis is present, with no evidence of aortic valve stenosis. Pulmonic Valve: The pulmonic valve was normal in structure. Pulmonic valve regurgitation is not visualized. No evidence of pulmonic stenosis. Aorta: The aortic root is normal in size and structure. Venous: The inferior vena cava is normal in size with greater than 50% respiratory variability, suggesting right atrial pressure of 3 mmHg. IAS/Shunts: No atrial level shunt detected by color flow Doppler. Additional Comments: 3D was performed  not requiring image post processing on an independent workstation and was indeterminate.  LEFT VENTRICLE PLAX 2D LVIDd:         4.00 cm   Diastology LVIDs:         2.70 cm   LV e' medial:    10.00 cm/s LV PW:         1.00 cm   LV E/e' medial:  5.9 LV IVS:        0.90 cm   LV e' lateral:   12.70 cm/s LVOT diam:     2.30 cm   LV E/e' lateral: 4.6 LV SV:         84 LV SV Index:   40 LVOT Area:     4.15 cm  RIGHT VENTRICLE             IVC RV Basal diam:  3.50 cm     IVC diam: 1.60 cm RV Mid diam:    2.50 cm RV S prime:     11.60 cm/s TAPSE (M-mode): 1.8 cm LEFT ATRIUM           Index        RIGHT ATRIUM           Index LA diam:      2.50 cm 1.20 cm/m   RA Area:     13.60 cm LA Vol (A2C): 27.2 ml 13.03 ml/m  RA Volume:   26.40 ml  12.64 ml/m LA Vol (A4C): 26.5 ml 12.69 ml/m  AORTIC VALVE LVOT Vmax:   92.80 cm/s LVOT Vmean:  60.800 cm/s LVOT VTI:    0.201 m  AORTA Ao Root diam: 2.40 cm MITRAL VALVE MV Area (PHT): 2.72 cm    SHUNTS MV Decel Time: 279 msec    Systemic VTI:  0.20 m MV E velocity: 58.70 cm/s  Systemic Diam: 2.30 cm MV A velocity: 71.80 cm/s MV E/A ratio:  0.82 Maude Emmer MD Electronically signed by Maude Emmer MD Signature Date/Time: 06/16/2024/2:10:37 PM    Final    Recent Labs    06/17/24 0628 06/18/24 0459  WBC 10.8* 10.6*  HGB 13.1 12.7*  HCT 37.8* 36.0*  PLT 208 252   Recent Labs    06/17/24 0628 06/18/24 0459  NA 129* 129*  K 3.9 4.0  CL 98 96*  CO2 20* 24  GLUCOSE 102* 92  BUN 10 10  CREATININE 0.63 0.77  CALCIUM 8.4* 8.6*        Intake/Output Summary (Last 24 hours) at 06/18/2024 1041 Last data filed at 06/18/2024 0521 Gross per 24 hour  Intake --  Output 1000 ml  Net -1000 ml  Physical Exam: Vital Signs Blood pressure 137/72, pulse 65, temperature 97.6 F (36.4 C), temperature source Oral, resp. rate 16, height 6' 2.5 (1.892 m), weight 79.2 kg, SpO2 98%.    General: No acute distress, sitting up in bed eating breakfast Mood and affect are  appropriate HEENT: abrasions to face, HOH? Hearing aids donned.  Heart: Regular rate and rhythm no rubs murmurs or extra sounds Lungs: Clear to auscultation, breathing unlabored, no rales or wheezes Abdomen: Positive bowel sounds, soft nontender to palpation, nondistended Extremities: No clubbing, cyanosis, or edema Skin: No evidence of breakdown, no evidence of rash, cervical incision CDI and covered, bilateral knee abrasions multiple some epithelialized some not, no lacerations Neuro: alert, either HOH or perhaps some cognitive slowness/delayed processing. Follows commands.   PRIOR EXAMS: Neurologic: motor strength is 4/5 in bilateral deltoid, bicep, tricep, grip, hip flexor, knee extensors, ankle dorsiflexor and plantar flexor Sensory exam normal sensation to light touch in bilateral upper and lower extremities Cerebellar exam normal finger to nose to finger  Musculoskeletal: Full range of motion in all 4 extremities. No joint swelling  Assessment/Plan: 1. Functional deficits which require 3+ hours per day of interdisciplinary therapy in a comprehensive inpatient rehab setting. Physiatrist is providing close team supervision and 24 hour management of active medical problems listed below. Physiatrist and rehab team continue to assess barriers to discharge/monitor patient progress toward functional and medical goals  Care Tool:  Bathing              Bathing assist       Upper Body Dressing/Undressing Upper body dressing        Upper body assist      Lower Body Dressing/Undressing Lower body dressing            Lower body assist       Toileting Toileting    Toileting assist       Transfers Chair/bed transfer  Transfers assist     Chair/bed transfer assist level: Moderate Assistance - Patient 50 - 74%     Locomotion Ambulation   Ambulation assist              Walk 10 feet activity   Assist           Walk 50 feet activity   Assist            Walk 150 feet activity   Assist           Walk 10 feet on uneven surface  activity   Assist           Wheelchair     Assist               Wheelchair 50 feet with 2 turns activity    Assist            Wheelchair 150 feet activity     Assist          Blood pressure 137/72, pulse 65, temperature 97.6 F (36.4 C), temperature source Oral, resp. rate 16, height 6' 2.5 (1.892 m), weight 79.2 kg, SpO2 98%.  Medical Problem List and Plan: 1. Functional deficits secondary to odontoid fracture             -patient may  shower if incision is covered             -ELOS/Goals: 5-7d Sup/minA  -Continue CIR   2.  Antithrombotics: -DVT/anticoagulation:  Mechanical: Sequential compression devices, below knee Bilateral lower extremities             -  antiplatelet therapy: N/A  3. Pain Management: Oxycodone  prn. Tylenol  prn  4. Mood/Behavior/Sleep: LCSW to follow for evaluation and support             --Melatonin prn for sleep.              -antipsychotic agents: N/A  5. Neuropsych/cognition: This patient appears to be capable of making decisions on his own behalf. -06/18/24 pt HOH but also ?cognitively slow vs having processing delays-- not sure if SLP consult might be helpful?  6. Skin/Wound Care: routine pressure relief measures and skin care for wounds. Neosporin ordered.  -06/18/24 daughter wants WOC to see him about his abrasions; defer to weekday team, also note that his sutures on his face were put in on 11/19, check for readiness to remove on 06/21/24  7. Fluids/Electrolytes/Nutrition: Monitor I/O and routine labs, continue vitamins/supplements.   -Hyponatremia as below #11  8. Chronic rhinitis/allergies: Has chronic cough a/w this --continue Flonase  and Breo inhaler.    9. BPH: Managed with Proscar  5mg  daily  10. Alzheimer's dementia: Per family decline over  past couple of years. Tends to repeat himself             --very  HOH. Dry erase board for communication needs.    -see #5  11. Acute on chronic hyponatremia: Baseline Na-132-->128             --Question SIADH v/s cerebral salt wasting.   -06/18/24 Na 129, stable, monitor twice weekly.    12. Constipation: 06/18/24 no BM in 2 days, normal for him, consider med adjustment if continued. Has PRNs but not sure he'd ask for them. -ADDENDUM: daughter mentioned it has actually been since surgery day that he had a BM, added colace 200mg  daily  13. Leukocytosis: gradually improving 17.6>15.9>10.8>10.6. Likely postoperative. No s/sx of infection, monitor for changes. Monitor twice weekly.     LOS: 1 days A FACE TO FACE EVALUATION WAS PERFORMED  [address] 06/18/2024, 10:41 AM

## 2024-06-18 NOTE — Evaluation (Signed)
 Occupational Therapy Assessment and Plan  Patient Details  Name: Adrian Ray MRN: [id number] Date of Birth: [birth year]  OT Diagnosis: abnormal posture and acute pain Rehab Potential: Rehab Potential (ACUTE ONLY): Good ELOS: ~ 10-12 days   Today's Date: 06/18/2024 OT Individual Time: 0845-1000 OT Individual Time Calculation (min): 75 min     Hospital Problem: Principal Problem:   Odontoid fracture (HCC)   Past Medical History:  Past Medical History:  Diagnosis Date   BPH (benign prostatic hyperplasia)    Cataracts, bilateral    Clavicle fracture    left   COPD (chronic obstructive pulmonary disease) (HCC)    COVID-19    Dementia (HCC)    Deviated septum    GERD (gastroesophageal reflux disease)    Hyperlipemia    Moderately severe hearing loss    Pulmonary nodule    Tinnitus    Past Surgical History:  Past Surgical History:  Procedure Laterality Date   APPENDECTOMY     POSTERIOR CERVICAL FUSION/FORAMINOTOMY N/A 06/14/2024   Procedure: POSTERIOR CERVICAL FUSION/FORAMINOTOMY CERVICAL ONE- TWO;  Surgeon: Darnella Dorn SAUNDERS, MD;  Location: [facility] OR;  Service: Neurosurgery;  Laterality: N/A;  POSTERIOR CERVICAL FUSION/ C1-C2   SHOULDER SURGERY Right    2003    Assessment & Plan Clinical Impression: Patient is a 89 y.o. year old male history of COPD, bronchiectasis, BPH, B 12 deficiency, recent left clavicle Fx 6 weeks ago, Alzheimer's dementia who was admitted from his ILF after unwitnessed fall onto face with reports of neck pain and had multiple abrasions on BUE/BLE and face. He reported that he was walking when he felt weak, bent over and then fell onto his face. 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 He was taken to OR for C1-C2 segmental instrumentation and fusion with reduction of C2 Fx by Dr. Darnella. Post op PT/OT evaluations done and patient noted to be requiring min assist for mobility with BUE  support, has balance deficits as well as hearing loss with ability to follow simple commands with increased time. He is limited by neck pain ad needs communication board for conversation. He was independent PTA and therapy/family requested CIR due to functional decline.  Patient transferred to CIR on 06/17/2024 .    Patient currently requires mod with basic self-care skills and basic mobility secondary to muscle weakness and acute pain, decreased cardiorespiratoy endurance, impaired timing and sequencing and decreased coordination, and decreased standing balance, decreased postural control, and decreased balance strategies.  Prior to hospitalization, patient could complete ADLs with modified independent .  Patient will benefit from skilled intervention to decrease level of assist with basic self-care skills and increase independence with basic self-care skills prior to discharge home with care partner.  Anticipate patient will require 24 hour supervision and follow up home health.  OT - End of Session Activity Tolerance: Tolerates 30+ min activity with multiple rests Endurance Deficit: Yes OT Assessment Rehab Potential (ACUTE ONLY): Good OT Barriers to Discharge: Insurance for SNF coverage OT Barriers to Discharge Comments: (P) chart reasa OT Patient demonstrates impairments in the following area(s): Balance;Safety;Motor;Pain;Skin Integrity OT Basic ADL's Functional Problem(s): Bathing;Dressing;Toileting OT Transfers Functional Problem(s): Toilet;Tub/Shower OT Additional Impairment(s): None OT Plan OT Intensity: Minimum of 1-2 x/day, 45 to 90 minutes OT Frequency: 5 out of 7 days OT Duration/Estimated Length of Stay: ~ 10-12 days OT Treatment/Interventions: Balance/vestibular training;Discharge planning;Pain management;Self Care/advanced ADL retraining;Therapeutic Activities;UE/LE Coordination activities;Disease mangement/prevention;Functional mobility training;Patient/family education;Skin  care/wound managment;Therapeutic Exercise;Community reintegration;DME/adaptive equipment instruction;Psychosocial support;Splinting/orthotics;UE/LE Strength taining/ROM;Neuromuscular re-education OT Self Feeding Anticipated Outcome(s): n/a OT Basic Self-Care Anticipated Outcome(s): supervision OT Toileting Anticipated Outcome(s): supervision OT Bathroom Transfers Anticipated Outcome(s): supervision OT Recommendation Recommendations for Other Services: Neuropsych consult Patient destination: Assisted Living Follow Up Recommendations: Home health OT Equipment Recommended: To be determined   OT Evaluation Precautions/Restrictions  Precautions Precautions: Fall;Cervical Restrictions Weight Bearing Restrictions Per Provider Order: No Other Position/Activity Restrictions: cervical; no pushing/pulling lifting more than 5-10 lbs General Chart Reviewed: Yes Family/Caregiver Present: No Vital Signs   Pain Pain Assessment Pain Scale: 0-10 Pain Score: 7  Pain Location: Back Pain Intervention(s): Medication (See eMAR) Home Living/Prior Functioning Home Living Type of Home: Independent living facility Home Access: Level entry Home Layout: One level Bathroom Shower/Tub: Health Visitor: Standard Additional Comments: [place]- wife is in the SNF and reports has been for the last 6 years  Lives With: Alone Vision Baseline Vision/History: 1 Wears glasses (to read) Ability to See in Adequate Light: 0 Adequate Vision Assessment?: No apparent visual deficits Perception  Perception: Within Functional Limits Praxis Praxis: WFL Cognition Cognition Overall Cognitive Status: Within Functional Limits for tasks assessed Arousal/Alertness: Awake/alert Orientation Level: Person;Place;Situation Person: Oriented Place: Oriented Situation: Oriented Memory: Impaired Memory Impairment: Decreased recall of new information;Decreased short term memory Attention:  Focused;Sustained Focused Attention: Appears intact Sustained Attention: Appears intact Awareness: Impaired Awareness Impairment: Anticipatory impairment Safety/Judgment: Appears intact Brief Interview for Mental Status (BIMS) Repetition of Three Words (First Attempt): 3 Temporal Orientation: Year: Missed by 1 year Temporal Orientation: Month: Accurate within 5 days Temporal Orientation: Day: Correct Recall: Sock: Yes, no cue required Recall: Blue: Yes, no cue required Recall: Bed: Yes, no cue required BIMS Summary Score: 14 Sensation Sensation Light Touch: Appears Intact Proprioception: Appears Intact Coordination Gross Motor Movements are Fluid and Coordinated: No Fine Motor Movements are Fluid and Coordinated: Yes Coordination and Movement Description: LEs decr coordination Motor  Motor Motor: Abnormal postural alignment and control Motor - Skilled Clinical Observations: generalized weakness; acute pain in teh neck  Trunk/Postural Assessment  Cervical Assessment Cervical Assessment:  (neck precautions) Thoracic Assessment Thoracic Assessment: Within Functional Limits Lumbar Assessment Lumbar Assessment:  (posterior pelvic tilt) Postural Control Postural Control: Deficits on evaluation Trunk Control: good - leans posteriorly when goes to get up Righting Reactions: delayed Protective Responses: delayed  Balance Balance Balance Assessed: Yes Static Sitting Balance Static Sitting - Balance Support: Feet supported;No upper extremity supported Static Sitting - Level of Assistance: 5: Stand by assistance Dynamic Sitting Balance Dynamic Sitting - Balance Support: During functional activity Dynamic Sitting - Level of Assistance: 4: Min assist Static Standing Balance Static Standing - Balance Support: During functional activity Static Standing - Level of Assistance: 4: Min assist;3: Mod assist Dynamic Standing Balance Dynamic Standing - Balance Support: During  functional activity Dynamic Standing - Level of Assistance: 3: Mod assist Extremity/Trunk Assessment RUE Assessment RUE Assessment: Within Functional Limits LUE Assessment LUE Assessment: Within Functional Limits  Care Tool Care Tool Self Care Eating   Eating Assist Level: Set up assist    Oral Care    Oral Care Assist Level: Set up assist    Bathing   Body parts bathed by patient: Right arm;Left arm;Chest;Abdomen;Front perineal area;Buttocks;Right upper leg;Left upper leg;Face Body parts bathed by helper: Right lower leg;Left lower leg   Assist Level: Minimal Assistance - Patient > 75%    Upper Body Dressing(including orthotics)   What is the patient wearing?: Pull over  shirt   Assist Level: Moderate Assistance - Patient 50 - 74%    Lower Body Dressing (excluding footwear)   What is the patient wearing?: Underwear/pull up;Pants Assist for lower body dressing: Maximal Assistance - Patient 25 - 49%    Putting on/Taking off footwear   What is the patient wearing?: Ted hose;Shoes Assist for footwear: Total Assistance - Patient < 25%       Care Tool Toileting Toileting activity   Assist for toileting: Minimal Assistance - Patient > 75%     Care Tool Bed Mobility Roll left and right activity        Sit to lying activity        Lying to sitting on side of bed activity   Lying to sitting on side of bed assist level: the ability to move from lying on the back to sitting on the side of the bed with no back support.: Maximal Assistance - Patient 25 - 49%     Care Tool Transfers Sit to stand transfer   Sit to stand assist level: Moderate Assistance - Patient 50 - 74%    Chair/bed transfer   Chair/bed transfer assist level: Moderate Assistance - Patient 50 - 74%     Toilet transfer   Assist Level: Moderate Assistance - Patient 50 - 74%     Care Tool Cognition  Expression of Ideas and Wants Expression of Ideas and Wants: 4. Without difficulty (complex and basic) -  expresses complex messages without difficulty and with speech that is clear and easy to understand  Understanding Verbal and Non-Verbal Content Understanding Verbal and Non-Verbal Content: 4. Understands (complex and basic) - clear comprehension without cues or repetitions   Memory/Recall Ability     Refer to Care Plan for Long Term Goals  SHORT TERM GOAL WEEK 1 OT Short Term Goal 1 (Week 1): PT will be able to perform sit to stands with min cues for proper hand placement and with contact guard during ADL tasks OT Short Term Goal 2 (Week 1): Pt will be able to thread LB (underwear and pants) with setup OT Short Term Goal 3 (Week 1): Pt will be able to button pants in standing using both hands with contact guard (decr posterior lean)  Recommendations for other services: Neuropsych   Skilled Therapeutic Intervention 1:1 OT eval initated with Ot purpose, role and goals discussed. Self care retraining at shower level. PT continues to move very slow and with ongoing neck pain. Pt with decr ability to lean forward with functional tasks ie LB dressing and with sit to stands. Pt presents with a posterior lean in static and dynamic standing requiring mod A to maintain balance. Pt can ambulate with the RW with min A (once gets forward motion pt with decr posterior lean); however requires A to steer RW and cues for safety with it.   Pt received in the bed - mod A to come to EOB. Mod A stand pivot into the w/c. Grooming at sink with setup. Pt ambulated to bathroom with RW. Pt requires mod A to come into standing at times but with the RW in front of him and him pushing up with bilateral hands on the arm rests- can come into standing with min A. Stood at toilet to urinate and then turned to sit down to have a small BM. PT then transitioned into the shower. Pt required A to sit to stand and to wash lower legs. Returned to w/c in the room with RW  with extra time. TP with difficulty with coordinating threading lower  legs and decr ability to come forward with trunk. Pt required mod A to maintain standing balance while trying to fasten his pants.  Teds and shoes donned with total A due to time.  Pt left sitting up in the w/c.   ADL ADL Eating: Set up Where Assessed-Eating: Bed level Grooming: Setup Where Assessed-Grooming: Sitting at sink Upper Body Bathing: Supervision/safety Where Assessed-Upper Body Bathing: Shower Lower Body Bathing: Minimal assistance Where Assessed-Lower Body Bathing: Shower Lower Body Dressing: Moderate assistance Where Assessed-Lower Body Dressing: Chair Toileting: Minimal assistance Toilet Transfer: Moderate assistance Toilet Transfer Method: Proofreader: Acupuncturist: Insurance Underwriter Method: Designer, Industrial/product: Shower seat without back Mobility   Didn't pull in  Sit to stands mod A, posterior lean; functional ambulation with RW with mod A    Discharge Criteria: Patient will be discharged from OT if patient refuses treatment 3 consecutive times without medical reason, if treatment goals not met, if there is a change in medical status, if patient makes no progress towards goals or if patient is discharged from hospital.  The above assessment, treatment plan, treatment alternatives and goals were discussed and mutually agreed upon: by patient  Claudene Delon Levy 06/18/2024, 11:49 AM

## 2024-06-18 NOTE — Plan of Care (Signed)
  Problem: RH Balance Goal: LTG Patient will maintain dynamic sitting balance (PT) Description: LTG:  Patient will maintain dynamic sitting balance with assistance during mobility activities (PT) Flowsheets (Taken 06/18/2024 1601) LTG: Pt will maintain dynamic sitting balance during mobility activities with:: Supervision/Verbal cueing Goal: LTG Patient will maintain dynamic standing balance (PT) Description: LTG:  Patient will maintain dynamic standing balance with assistance during mobility activities (PT) Flowsheets (Taken 06/18/2024 1601) LTG: Pt will maintain dynamic standing balance during mobility activities with:: Supervision/Verbal cueing   Problem: Sit to Stand Goal: LTG:  Patient will perform sit to stand with assistance level (PT) Description: LTG:  Patient will perform sit to stand with assistance level (PT) Flowsheets (Taken 06/18/2024 1601) LTG: PT will perform sit to stand in preparation for functional mobility with assistance level: Supervision/Verbal cueing   Problem: RH Bed Mobility Goal: LTG Patient will perform bed mobility with assist (PT) Description: LTG: Patient will perform bed mobility with assistance, with/without cues (PT). Flowsheets (Taken 06/18/2024 1601) LTG: Pt will perform bed mobility with assistance level of: Supervision/Verbal cueing   Problem: RH Bed to Chair Transfers Goal: LTG Patient will perform bed/chair transfers w/assist (PT) Description: LTG: Patient will perform bed to chair transfers with assistance (PT). Flowsheets (Taken 06/18/2024 1601) LTG: Pt will perform Bed to Chair Transfers with assistance level: Supervision/Verbal cueing   Problem: RH Car Transfers Goal: LTG Patient will perform car transfers with assist (PT) Description: LTG: Patient will perform car transfers with assistance (PT). Flowsheets (Taken 06/18/2024 1601) LTG: Pt will perform car transfers with assist:: Supervision/Verbal cueing   Problem: RH Ambulation Goal: LTG  Patient will ambulate in controlled environment (PT) Description: LTG: Patient will ambulate in a controlled environment, # of feet with assistance (PT). Flowsheets (Taken 06/18/2024 1601) LTG: Pt will ambulate in controlled environ  assist needed:: Supervision/Verbal cueing LTG: Ambulation distance in controlled environment: 150 feet with LRAD Goal: LTG Patient will ambulate in home environment (PT) Description: LTG: Patient will ambulate in home environment, # of feet with assistance (PT). Flowsheets (Taken 06/18/2024 1601) LTG: Pt will ambulate in home environ  assist needed:: Supervision/Verbal cueing LTG: Ambulation distance in home environment: 50 feet with LRAD   Problem: RH Stairs Goal: LTG Patient will ambulate up and down stairs w/assist (PT) Description: LTG: Patient will ambulate up and down # of stairs with assistance (PT) Flowsheets (Taken 06/18/2024 1601) LTG: Pt will ambulate up/down stairs assist needed:: Supervision/Verbal cueing LTG: Pt will  ambulate up and down number of stairs: 4 steps with LRA

## 2024-06-19 DIAGNOSIS — G301 Alzheimer's disease with late onset: Secondary | ICD-10-CM

## 2024-06-19 DIAGNOSIS — S12100D Unspecified displaced fracture of second cervical vertebra, subsequent encounter for fracture with routine healing: Secondary | ICD-10-CM

## 2024-06-19 DIAGNOSIS — E871 Hypo-osmolality and hyponatremia: Secondary | ICD-10-CM | POA: Diagnosis not present

## 2024-06-19 DIAGNOSIS — F02A0 Dementia in other diseases classified elsewhere, mild, without behavioral disturbance, psychotic disturbance, mood disturbance, and anxiety: Secondary | ICD-10-CM

## 2024-06-19 DIAGNOSIS — K5901 Slow transit constipation: Secondary | ICD-10-CM | POA: Diagnosis not present

## 2024-06-19 LAB — BASIC METABOLIC PANEL WITH GFR
Anion gap: 10 (ref 5–15)
BUN: 10 mg/dL (ref 8–23)
CO2: 24 mmol/L (ref 22–32)
Calcium: 8.6 mg/dL — ABNORMAL LOW (ref 8.9–10.3)
Chloride: 95 mmol/L — ABNORMAL LOW (ref 98–111)
Creatinine, Ser: 0.71 mg/dL (ref 0.61–1.24)
GFR, Estimated: >60 mL/min (ref >60)
Glucose, Bld: 94 mg/dL (ref 70–99)
Potassium: 4.1 mmol/L (ref 3.5–5.1)
Sodium: 129 mmol/L — ABNORMAL LOW (ref 135–145)

## 2024-06-19 LAB — CBC
HCT: 39.3 % (ref 39.0–52.0)
Hemoglobin: 13.8 g/dL (ref 13.0–17.0)
MCH: 32.4 pg (ref 26.0–34.0)
MCHC: 35.1 g/dL (ref 30.0–36.0)
MCV: 92.3 fL (ref 80.0–100.0)
Platelets: 261 K/uL (ref 150–400)
RBC: 4.26 MIL/uL (ref 4.22–5.81)
RDW: 12.7 % (ref 11.5–15.5)
WBC: 9.2 K/uL (ref 4.0–10.5)
nRBC: 0 % (ref 0.0–0.2)

## 2024-06-19 MED ORDER — SORBITOL 70 % SOLN
30.0000 mL | Freq: Every day | Status: DC | PRN
  Administered 2024-06-21: 30 mL via ORAL
  Filled 2024-06-19: qty 30

## 2024-06-19 MED ORDER — POLYETHYLENE GLYCOL 3350 17 G PO PACK
17.0000 g | PACK | Freq: Every day | ORAL | Status: DC | PRN
  Administered 2024-06-20 – 2024-06-21 (×2): 17 g via ORAL
  Filled 2024-06-19 (×2): qty 1
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
## 2024-06-19 NOTE — Progress Notes (Signed)
 Patient ID: KARLA VINES, male   DOB: 06/13/1934, 89 y.o.   MRN: [id number]  Left message for [facility]Admissions at [facility] to let know will be following and working on discharge needs of pt. Will let know after team conference on Wednesday

## 2024-06-19 NOTE — Progress Notes (Signed)
 Inpatient Rehabilitation  Patient information reviewed and entered into eRehab system by [facility]. Karen Kays., CCC/SLP, PPS Coordinator.  Information including medical coding, functional ability and quality indicators will be reviewed and updated through discharge.

## 2024-06-19 NOTE — Progress Notes (Signed)
 Inpatient Rehabilitation Center Individual Statement of Services  Patient Name:  Adrian Ray  Date:  06/19/2024  Welcome to the Inpatient Rehabilitation Center.  Our goal is to provide you with an individualized program based on your diagnosis and situation, designed to meet your specific needs.  With this comprehensive rehabilitation program, you will be expected to participate in at least 3 hours of rehabilitation therapies Monday-Friday, with modified therapy programming on the weekends.  Your rehabilitation program will include the following services:  Physical Therapy (PT), Occupational Therapy (OT), 24 hour per day rehabilitation nursing, Therapeutic Recreaction (TR), Care Coordinator, Rehabilitation Medicine, Nutrition Services, and Pharmacy Services  Weekly team conferences will be held on Wednesday to discuss your progress.  Your Inpatient Rehabilitation Care Coordinator will talk with you frequently to get your input and to update you on team discussions.  Team conferences with you and your family in attendance may also be held.  Expected length of stay: 7-12 days  Overall anticipated outcome: supervision with cues  Depending on your progress and recovery, your program may change. Your Inpatient Rehabilitation Care Coordinator will coordinate services and will keep you informed of any changes. Your Inpatient Rehabilitation Care Coordinator's name and contact numbers are listed  below.  The following services may also be recommended but are not provided by the Inpatient Rehabilitation Center:   Home Health Rehabiltiation Services Outpatient Rehabilitation Services    Arrangements will be made to provide these services after discharge if needed.  Arrangements include referral to agencies that provide these services.  Your insurance has been verified to be:  Medicare and Generic commerical Your primary doctor is:  Adrian Ray  Pertinent information will be shared with your doctor  and your insurance company.  Inpatient Rehabilitation Care Coordinator:  Rhoda Clement, KEN [phone] or ELIGAH BASQUES  Information discussed with and copy given to patient by: Clement Asberry MATSU, 06/19/2024, 11:16 AM

## 2024-06-19 NOTE — Progress Notes (Signed)
 Occupational Therapy Session Note  Patient Details  Name: Adrian Ray MRN: [id number] Date of Birth: [birth year]  Today's Date: 06/19/2024 OT Individual Time: 9169-9054 and 1350 - 1505 OT Individual Time Calculation (min): 75 min and 75 min    Short Term Goals: Week 1:  OT Short Term Goal 1 (Week 1): PT will be able to perform sit to stands with min cues for proper hand placement and with contact guard during ADL tasks OT Short Term Goal 2 (Week 1): Pt will be able to thread LB (underwear and pants) with setup OT Short Term Goal 3 (Week 1): Pt will be able to button pants in standing using both hands with contact guard (decr posterior lean)  Skilled Therapeutic Interventions/Progress Updates:    Visit 1:  Pain: no c/o pain Pt received in bed and agreeable to a shower.  Pt was much stronger and more stable today, needing only slight stability with ambulation with RW.  In standing without UE support, pt had slight posterior lean with LB self care.   Pt completed toileting, shower, grooming, and dressing.   ADL Eating: Set up Where Assessed-Eating: Bed level Grooming: Setup Where Assessed-Grooming: Sitting at sink Upper Body Bathing: Supervision/safety Where Assessed-Upper Body Bathing: Shower Lower Body Bathing: Minimal assistance Where Assessed-Lower Body Bathing: Shower Upper Body Dressing: Supervision/safety Where Assessed-Upper Body Dressing: Edge of bed Lower Body Dressing: Minimal assistance Where Assessed-Lower Body Dressing: Edge of bed Toileting: Minimal assistance Where Assessed-Toileting: Teacher, Adult Education: Furniture Conservator/restorer Method: Proofreader: Acupuncturist: Administrator, Arts Method: Designer, Industrial/product: Shower seat without back, Grab bars  Pt opted to lay down at end of session.  Alarm on and all needs met.    Visit 2: Pain:no c/o pain   Pt received in w/c and his  caregiver Adrian Ray present.  Explained to Adrian Ray about the amount of A he needed this am and that I anticipate he will only need supervision once discharged from here unless he lives sooner to dc to the SNF. Provided pt with a long handled sponge to use for his next shower as he was not able to reach his feet in the small shower. Trialed a sock aid, but the tight sock compressed the sock aid too much and was too tight around his toes to don the sock.  Next time will try to have pt cross legs in figure 4 position to see if he can don socks more easily.  Pt and Adrian Ray taken to ADL apt to work on furniture transfers to the tjx companies.  From Wc pt stood up with CGA and then ambulated into apt to recliner.  Because recliner so low he did need more of a mod A to come to stand.  Needs cues to push up with hands from seat vs RW.   Adrian Ray discussed how Adrian Ray likes to walk from his apt to visit his wife in the SNF and walks up a hill incline.  Pt agreeable to going outside to practice walking on the pavement.   CGA sit to stands from wc and then CGA to ambulate 200 ft 2x around the outside pathway.  Pt enjoyed the fresh air and sun.  Pt returned to room, ambulated from doorway to sink to brush teeth, then into bathroom to toilet. Urinated in standing and he managed his clothing over hips but did need A to grab pants from ankles.  Returned to bed to rest.  S into bed.  Alarm set and all needs met.   Therapy Documentation Precautions:  Precautions Precautions: Fall, Cervical Recall of Precautions/Restrictions: Impaired Restrictions Weight Bearing Restrictions Per Provider Order: No Other Position/Activity Restrictions: cervical; no pushing/pulling lifting more than 5-10 lbs          Therapy/Group: Individual Therapy  Adrian Ray 06/19/2024, 12:51 PM
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
## 2024-06-19 NOTE — Progress Notes (Signed)
 Physical Therapy Session Note  Patient Details  Name: Adrian Ray MRN: [id number] Date of Birth: 10/06/1933  Today's Date: 06/19/2024 PT Individual Time: 1101-1200 PT Individual Time Calculation (min): 59 min   Short Term Goals: Week 1:  PT Short Term Goal 1 (Week 1): pt will perorm sit to stand with LRAD and CGA PT Short Term Goal 2 (Week 1): pt will perform bed to chair tansfer with LRAD and CGA PT Short Term Goal 3 (Week 1): Pt will ambulated 150 feet with LRAD and CGA  Skilled Therapeutic Interventions/Progress Updates:  Patient supine in bed and asleep on entrance to room. Patient requires effort to wake and time to become alert and agreeable to PT session. Does have sense of humor in response to questions re: CLOF and home environment.   Patient with 5/10 pain complaint in posterior neck at start of session.  Therapeutic Activity: Bed Mobility: Pt performed supine <> sit with good technique and requiring CGA to complete with extra time. VC/ tc required for maintaining effort without holding breath. Transfers: Pt performed sit<>stand and stand pivot transfers throughout session with MinA for balance. Provided vc/ tc for hand positioning/ use to improve forward lean with hands on seated surface. Used RW for stand pivot to various surfaces and heights.   Gait Training:  Pt ambulated very short distances from NuStep to w/c and bed to w/c. Used RW for balance while demonstrating flexed posture. Low step height bilaterally with slow RW progression. Provided vc/ tc for level gaze and increased step height.  Pt guided in continuous reciprocation of BUE and BLE using NuStep L3 x with focus on maintaining pace with use of pace partner program. He is able to maintain an average of 66 steps/ min throughout completing 1005 steps over distance of 0.6 mi. Averages 1.8 METs during bout. At provided pt of option to stop and perform ambulation back toward room or continue with Nustep and pt  choosing NuStep as it feels good.   Patient seated upright in w/c at end of session with brakes locked, belt alarm set, and all needs within reach. Pt's personal aide present in room for supervision through lunch.    Therapy Documentation Precautions:  Precautions Precautions: Fall, Cervical Recall of Precautions/Restrictions: Impaired Restrictions Weight Bearing Restrictions Per Provider Order: No Other Position/Activity Restrictions: cervical; no pushing/pulling lifting more than 5-10 lbs  Pain: Pt premedicated and with pain at surgical site in posterior neck.    Therapy/Group: Individual Therapy  [name] PT, DPT, CSRS 06/19/2024, 5:28 PM

## 2024-06-20 DIAGNOSIS — E871 Hypo-osmolality and hyponatremia: Secondary | ICD-10-CM | POA: Diagnosis not present

## 2024-06-20 DIAGNOSIS — S12100D Unspecified displaced fracture of second cervical vertebra, subsequent encounter for fracture with routine healing: Secondary | ICD-10-CM | POA: Diagnosis not present

## 2024-06-20 NOTE — Evaluation (Signed)
 Speech Language Pathology Assessment and Plan  Patient Details  Name: Adrian Ray MRN: [id number] Date of Birth: 1934-01-23  SLP Diagnosis: Cognitive Impairments  Rehab Potential: Good ELOS: 10-12 days    Today's Date: 06/20/2024 SLP Individual Time: 9185-9085 SLP Individual Time Calculation (min): 60 min   Hospital Problem: Principal Problem:   Odontoid fracture (HCC)  Past Medical History:  Past Medical History:  Diagnosis Date   BPH (benign prostatic hyperplasia)    Cataracts, bilateral    Clavicle fracture    left   COPD (chronic obstructive pulmonary disease) (HCC)    COVID-19    Dementia (HCC)    Deviated septum    GERD (gastroesophageal reflux disease)    Hyperlipemia    Moderately severe hearing loss    Pulmonary nodule    Tinnitus    Past Surgical History:  Past Surgical History:  Procedure Laterality Date   APPENDECTOMY     POSTERIOR CERVICAL FUSION/FORAMINOTOMY N/A 06/14/2024   Procedure: POSTERIOR CERVICAL FUSION/FORAMINOTOMY CERVICAL ONE- TWO;  Surgeon: Darnella Dorn SAUNDERS, MD;  Location: [facility];  Service: Neurosurgery;  Laterality: N/A;  POSTERIOR CERVICAL FUSION/ C1-C2   SHOULDER SURGERY Right    2003    Assessment / Plan / Recommendation Clinical Impression HPI: 89 yo male with recent diagnosis of dementia (independent living, adls, recent MCA 6 weeks ago) very HOH even with hearing aides, BPH, COPD, diverticulosis, HLD, and GERD who presented on 06/13/24 with ground level fall, with neck hyperextension. CT showed posteriorly displaced odontoid fracture.    Neurosurgery consulted. Underwent C1-2 fusion on 11/19. Postop pain management and mobilization. Syncope workup negative. Orthostatics normal. Leukocytosis, afebrile and UA clear. Felt likely reactive. Chronic hyponatremia. Follow labs and fluid restriction. Delirium precautions and Aricept on hold  Clinical Impression:  Patient was evaluated via the Cognistat and informal measures to assess  cognitive linguistic functioning. Patient independently recalled biographical information including living at [place], job hx and family. Patient oriented to self, situation, location and month/year, however disoriented to date/day (stating today is Thanksgiving). Patient with strengths in expressive language, attention and intellectual awareness of deficits. Patient with ? auditory comprehension deficits vs hard of hearing noted through complex cognitive tasks and phrase repetition activities. Patient benefits from repetition of and/or written instructions. Patient feels as if he can hear you, but can't always understand. Patient with severe deficits in short term recall and moderate deficits in simple problem solving including similiaries and judgement.  Pt would benefit from skilled ST services to maximize cognition and auditory comprehension in order to maximize functional independence at d/c. Anticipate patient will require supervision at d/c and f/u SLP services.    Skilled Therapeutic Interventions          Patient evaluated using a standardized cognitive linguistic assessment to assess current cognitive and communicative function. See above for details.    SLP Assessment  Patient will need skilled Speech Lanaguage Pathology Services during CIR admission    Recommendations  Oral Care Recommendations: Oral care BID Patient destination: Assisted Living Follow up Recommendations: Home Health SLP Equipment Recommended: None recommended by SLP    SLP Frequency 3 to 5 out of 7 days   SLP Duration  SLP Intensity  SLP Treatment/Interventions 10-12 days  Minumum of 1-2 x/day, 30 to 90 minutes  Cognitive remediation/compensation;Internal/external aids;Cueing hierarchy;Therapeutic Activities;Functional tasks;Multimodal communication approach;Patient/family education    Pain None reported   SLP Evaluation Cognition Overall Cognitive Status: Impaired/Different from baseline (baseline  dementia) Arousal/Alertness: Awake/alert Orientation Level: Oriented to  place;Oriented to person;Oriented to situation;Disoriented to time Year: 2025 Month: November Day of Week: Incorrect Focused Attention: Appears intact Sustained Attention: Appears intact Memory: Impaired Memory Impairment: Decreased recall of new information;Decreased short term memory Decreased Short Term Memory: Verbal basic Problem Solving: Impaired Problem Solving Impairment: Verbal basic;Functional basic  Comprehension Auditory Comprehension Overall Auditory Comprehension: Impaired (vs HOH) Yes/No Questions: Within Functional Limits (basic yes/no) Commands: Impaired One Step Basic Commands: 75-100% accurate Two Step Basic Commands: 75-100% accurate Conversation: Simple Interfering Components: Processing speed;Working civil service fast streamer;Hearing EffectiveTechniques: Repetition;Visual/Gestural cues;Extra processing time Expression Expression Primary Mode of Expression: Verbal Verbal Expression Overall Verbal Expression: Appears within functional limits for tasks assessed Level of Generative/Spontaneous Verbalization: Conversation Repetition:  (occasionally requires repetition likely due to [facility]) Oral Motor Oral Motor/Sensory Function Overall Oral Motor/Sensory Function: Within functional limits Motor Speech Overall Motor Speech: Appears within functional limits for tasks assessed  Care Tool Care Tool Cognition Ability to hear (with hearing aid or hearing appliances if normally used Ability to hear (with hearing aid or hearing appliances if normally used): 3. Highly impaired - absence of useful hearing   Expression of Ideas and Wants Expression of Ideas and Wants: 4. Without difficulty (complex and basic) - expresses complex messages without difficulty and with speech that is clear and easy to understand   Understanding Verbal and Non-Verbal Content Understanding Verbal and Non-Verbal Content: 3. Usually understands  - understands most conversations, but misses some part/intent of message. Requires cues at times to understand  Memory/Recall Ability Memory/Recall Ability : Current season;Location of own room;Staff names and faces;That he or she is in a hospital/hospital unit   Short Term Goals: Week 1: SLP Short Term Goal 1 (Week 1): Patient will demonstrate problem solving skills during functional situations given mod multimodal A SLP Short Term Goal 2 (Week 1): Patient will recall daily information given mod multimodal A SLP Short Term Goal 3 (Week 1): Patient will demonstrate auditory comprehension during participation in complex conversation given mod multimodal A  Refer to Care Plan for Long Term Goals  Recommendations for other services: None   Discharge Criteria: Patient will be discharged from SLP if patient refuses treatment 3 consecutive times without medical reason, if treatment goals not met, if there is a change in medical status, if patient makes no progress towards goals or if patient is discharged from hospital.  The above assessment, treatment plan, treatment alternatives and goals were discussed and mutually agreed upon: by patient  Maicee Ullman M.A., CCC-SLP 06/20/2024, 9:19 AM

## 2024-06-20 NOTE — Progress Notes (Signed)
 Physical Therapy Session Note  Patient Details  Name: Adrian Ray MRN: [id number] Date of Birth: 02-Aug-1933  Today's Date: 06/20/2024 PT Individual Time: 8663-8553 PT Individual Time Calculation (min): 70 min   Short Term Goals: Week 1:  PT Short Term Goal 1 (Week 1): pt will perorm sit to stand with LRAD and CGA PT Short Term Goal 2 (Week 1): pt will perform bed to chair tansfer with LRAD and CGA PT Short Term Goal 3 (Week 1): Pt will ambulated 150 feet with LRAD and CGA  Skilled Therapeutic Interventions/Progress Updates: Patient semi-reclined in room with caretaker present on entrance to room. Patient alert and agreeable to PT session.   Patient reported 8/10 pain posterior cervical neck around incision (nsg provided medication during session). Pt required use of white board throughout session to communicate due to being severely HOH.  Therapeutic Activity: Bed Mobility: Pt performed supine<>sit on EOB with CGA/HHA and increased time to perform (HOB slightly elevated) Transfers: Pt performed sit<>stand transfer without AD and with close to modA to stand from EOB, and min/modA to transfer to [facility] without AD. Pt then performed sit<> transfers throughout rest of session with RW and overall minA. Provided VC for hand placement. Pt transported from room<>main gym in [facility] dependently   Therapeutic Exercise: Pt performed the following exercises with therapist providing the described cuing and facilitation for improvement. - Step to 6 step with B UE support on RW and CGA/close supervision. 3 rounds close to fatigue with rest breaks required - Pt ambulated roughly 200' around main gym and hallway loop with RW and with CGA that progressed to supervision in order to increase cardiovascular endurance.   Patient semi-reclined in bed at end of session with brakes locked, caretaker present, bed alarm set, and all needs within reach.      Therapy Documentation Precautions:  Precautions Precautions:  Fall, Cervical Recall of Precautions/Restrictions: Impaired Restrictions Weight Bearing Restrictions Per Provider Order: No Other Position/Activity Restrictions: cervical; no pushing/pulling lifting more than 5-10 lbs   Therapy/Group: Individual Therapy  Joie Reamer PTA 06/20/2024, 3:44 PM

## 2024-06-20 NOTE — Progress Notes (Signed)
 Occupational Therapy Session Note  Patient Details  Name: Adrian Ray MRN: [id number] Date of Birth: 02/16/1934  Today's Date: 06/20/2024 OT Individual Time: 1000-1115 OT Individual Time Calculation (min): 75 min    Short Term Goals: Week 1:  OT Short Term Goal 1 (Week 1): PT will be able to perform sit to stands with min cues for proper hand placement and with contact guard during ADL tasks OT Short Term Goal 2 (Week 1): Pt will be able to thread LB (underwear and pants) with setup OT Short Term Goal 3 (Week 1): Pt will be able to button pants in standing using both hands with contact guard (decr posterior lean)  Skilled Therapeutic Interventions/Progress Updates:    Pt received in bed c/o severe neck pain.  RN provided him with medication and I provided him with a warm pack for his lower neck and shoulders to warm up his muscles prior to getting out of bed.   His daughter had called his phone via face time but pt not able to manipulate phone to answer it, so I dialed back so he could speak with her and family.   During this time, I donned his TED hose and obtained bag for soiled clothing.  Pt then sat to EOB with S.  Pt able to don underwear and pants over feet but needed min A when pant leg stuck on his toes.  Tried to cue pt but he said, can't you just help me?   Tried to explain that I wanted him to do as much as possible as he was independent before.  He was hurting so I gave him some A.   Needed min A to don shoes.  Pt was able to figure 4 cross R knee over L to adjust shoe.    Had his NT watch pt sit to stand, and manage clothing over hips so she could see his level of independence for the next time he needed to toilet. Pt declined needing to toilet.    Pt ambulated to gym to use the Nustep for LE (ambulated 150 ft with RW CGA).  Pt sat on nustep and pedaled feet for 15 min at intensity 5 and then another 10 at intensity 7.   Placed another warm pack on shoulders and lower neck for  pain relief while he was using nustep. Pt stated he felt pack helped his neck.  Pt then ambulated back to room and opted to lay down in bed. Pt adjusted in bed with all needs met.   Therapy Documentation Precautions:  Precautions Precautions: Fall, Cervical Recall of Precautions/Restrictions: Impaired Restrictions Weight Bearing Restrictions Per Provider Order: No Other Position/Activity Restrictions: cervical; no pushing/pulling lifting more than 5-10 lbs    Vital Signs: Therapy Vitals Temp: 97.7 F (36.5 C) Pulse Rate: 76 Resp: 20 BP: 114/63 Patient Position (if appropriate): Sitting Oxygen  Therapy SpO2: 99 % Pain: Pain Assessment Pain Scale: 0-10 Pain Score: 10-Worst pain ever Pain Location: Neck Pain Intervention(s): Medication (See eMAR) ADL: ADL Eating: Set up Where Assessed-Eating: Bed level Grooming: Setup Where Assessed-Grooming: Sitting at sink Upper Body Bathing: Supervision/safety Where Assessed-Upper Body Bathing: Shower Lower Body Bathing: Minimal assistance Where Assessed-Lower Body Bathing: Shower Upper Body Dressing: Supervision/safety Where Assessed-Upper Body Dressing: Edge of bed Lower Body Dressing: Minimal assistance Where Assessed-Lower Body Dressing: Edge of bed Toileting: Minimal assistance Where Assessed-Toileting: Teacher, Adult Education: Furniture Conservator/restorer Method: Proofreader: Acupuncturist: Administrator, Arts  Method: Ambulating Walk-In Shower Equipment: Shower seat without back, Grab bars   Therapy/Group: Individual Therapy  Tasmine Hipwell 06/20/2024, 12:26 PM

## 2024-06-20 NOTE — Plan of Care (Signed)
  Problem: Consults Goal: RH SPINAL CORD INJURY PATIENT EDUCATION Description:  See Patient Education module for education specifics.  Outcome: Progressing   Problem: SCI BOWEL ELIMINATION Goal: RH STG MANAGE BOWEL WITH ASSISTANCE Description: STG Manage Bowel with mod I Assistance. Outcome: Progressing Goal: RH STG SCI MANAGE BOWEL WITH MEDICATION WITH ASSISTANCE Description: STG SCI Manage bowel with medication with mod I assistance. Outcome: Progressing   Problem: SCI BLADDER ELIMINATION Goal: RH STG MANAGE BLADDER WITH ASSISTANCE Description: STG Manage Bladder With toileting Assistance Outcome: Progressing   Problem: RH SAFETY Goal: RH STG ADHERE TO SAFETY PRECAUTIONS W/ASSISTANCE/DEVICE Description: STG Adhere to Safety Precautions With cues Assistance/Device. Outcome: Progressing   Problem: RH PAIN MANAGEMENT Goal: RH STG PAIN MANAGED AT OR BELOW PT'S PAIN GOAL Description: < 4 with prns  Outcome: Progressing

## 2024-06-20 NOTE — Plan of Care (Signed)
  Problem: RH Cognition - SLP Goal: RH LTG Patient will demonstrate orientation with cues Description:  LTG:  Patient will demonstrate orientation to person/place/time/situation with cues (SLP)   Flowsheets (Taken 06/20/2024 9077) LTG Patient will demonstrate orientation to: Time LTG: Patient will demonstrate orientation using cueing (SLP): Minimal Assistance - Patient > 75%   Problem: RH Comprehension Communication Goal: LTG Patient will comprehend basic/complex auditory (SLP) Description: LTG: Patient will comprehend basic/complex auditory information with cues (SLP). Flowsheets (Taken 06/20/2024 [phone]) LTG: Patient will comprehend: Complex auditory information LTG: Patient will comprehend auditory information with cueing (SLP): Minimal Assistance - Patient > 75%   Problem: RH Problem Solving Goal: LTG Patient will demonstrate problem solving for (SLP) Description: LTG:  Patient will demonstrate problem solving for basic/complex daily situations with cues  (SLP) Flowsheets (Taken 06/20/2024 0922) LTG: Patient will demonstrate problem solving for (SLP): Basic daily situations LTG Patient will demonstrate problem solving for: Minimal Assistance - Patient > 75%   Problem: RH Memory Goal: LTG Patient will demonstrate ability for day to day (SLP) Description: LTG:   Patient will demonstrate ability for day to day recall/carryover during cognitive/linguistic activities with assist  (SLP) Flowsheets (Taken 06/20/2024 0922) LTG: Patient will demonstrate ability for day to day recall: New information LTG: Patient will demonstrate ability for day to day recall/carryover during cognitive/linguistic activities with assist (SLP): Minimal Assistance - Patient > 75%

## 2024-06-20 NOTE — IPOC Note (Addendum)
 Overall Plan of Care [facility]) Patient Details Name: Adrian Ray MRN: [id number] DOB: Oct 19, 1933  Admitting Diagnosis: Odontoid fracture [facility])  Hospital Problems: Principal Problem:   Odontoid fracture (HCC)     Functional Problem List: Nursing Bladder, Bowel, Pain, Endurance, Medication Management, Safety  PT Balance, Endurance, Motor, Pain, Perception, Safety  OT Balance, Safety, Motor, Pain, Skin Integrity  SLP Cognition  TR         Basic ADL's: OT Bathing, Dressing, Toileting     Advanced  ADL's: OT       Transfers: PT Bed Mobility, Bed to Chair, Car  OT Toilet, Tub/Shower     Locomotion: PT Ambulation, Stairs     Additional Impairments: OT None  SLP Social Cognition, Communication comprehension Problem Solving, Memory  TR      Anticipated Outcomes Item Anticipated Outcome  Self Feeding n/a  Swallowing      Basic self-care  supervision  Toileting  supervision   Bathroom Transfers supervision  Bowel/Bladder  manage bowel w mod I and bladder w toileting  Transfers  supervision  Locomotion  supervision  Communication  min  Cognition  min  Pain  Pain < 4 with prns  Safety/Judgment      Therapy Plan: PT Intensity: Minimum of 1-2 x/day ,45 to 90 minutes PT Frequency: 5 out of 7 days PT Duration Estimated Length of Stay: 7-12 days OT Intensity: Minimum of 1-2 x/day, 45 to 90 minutes OT Frequency: 5 out of 7 days OT Duration/Estimated Length of Stay: ~ 10-12 days SLP Intensity: Minumum of 1-2 x/day, 30 to 90 minutes SLP Frequency: 3 to 5 out of 7 days SLP Duration/Estimated Length of Stay: 10-12 days   Team Interventions: Nursing Interventions Bladder Management, Pain Management, Bowel Management, Medication Management, Discharge Planning, Disease Management/Prevention, Patient/Family Education  PT interventions Cognitive remediation/compensation, Ambulation/gait training, Discharge planning, DME/adaptive equipment instruction, Functional  mobility training, Pain management, Psychosocial support, Splinting/orthotics, Therapeutic Activities, UE/LE Strength taining/ROM, Visual/perceptual remediation/compensation, Warden/ranger, Community reintegration, Disease management/prevention, Functional electrical stimulation, Neuromuscular re-education, Patient/family education, Skin care/wound management, Stair training, Therapeutic Exercise, UE/LE Coordination activities, Wheelchair propulsion/positioning  OT Interventions Warden/ranger, Discharge planning, Pain management, Self Care/advanced ADL retraining, Therapeutic Activities, UE/LE Coordination activities, Disease mangement/prevention, Functional mobility training, Patient/family education, Skin care/wound managment, Therapeutic Exercise, Community reintegration, DME/adaptive equipment instruction, Psychosocial support, Splinting/orthotics, UE/LE Strength taining/ROM, Neuromuscular re-education  SLP Interventions Cognitive remediation/compensation, Internal/external aids, Cueing hierarchy, Therapeutic Activities, Functional tasks, Multimodal communication approach, Patient/family education  TR Interventions    SW/CM Interventions Discharge Planning, Psychosocial Support, Patient/Family Education   Barriers to Discharge MD  Severe hearing impairment   Nursing Lack of/limited family support ILF PTA; plans to go into ALF space at [facility] at discharge; Wife is in SNF  PT      OT Insurance for SNF coverage (P) chart reasa  SLP      SW       Team Discharge Planning: Destination: PT-Home ,OT- Assisted Living , SLP-Assisted Living Projected Follow-up: PT-Home health PT, OT-  Home health OT, SLP-Home Health SLP Projected Equipment Needs: PT-To be determined, OT- To be determined, SLP-None recommended by SLP Equipment Details: PT- , OT-  Patient/family involved in discharge planning: PT- Patient,  OT-Patient, SLP-Patient  MD ELOS: 10-12d Medical Rehab  Prognosis:  Good Assessment: The patient has been admitted for CIR therapies with the diagnosis of odontoid fracture. The team will be addressing functional mobility, strength, stamina, balance, safety, adaptive techniques and equipment, self-care, bowel and bladder mgt, patient and  caregiver education, pain management . Goals have been set at Sup. Anticipated discharge destination is SNF.        See Team Conference Notes for weekly updates to the plan of care

## 2024-06-20 NOTE — Progress Notes (Signed)
 PROGRESS NOTE   Subjective/Complaints:  Does better with hearing aides Joking during orientation   ROS: Patient denies CP, SOB, N/V/D  Objective:   No results found.  Recent Labs    06/18/24 0459 06/19/24 0455  WBC 10.6* 9.2  HGB 12.7* 13.8  HCT 36.0* 39.3  PLT 252 261   Recent Labs    06/18/24 0459 06/19/24 0455  NA 129* 129*  K 4.0 4.1  CL 96* 95*  CO2 24 24  GLUCOSE 92 94  BUN 10 10  CREATININE 0.77 0.71  CALCIUM 8.6* 8.6*        Intake/Output Summary (Last 24 hours) at 06/20/2024 0814 Last data filed at 06/20/2024 0500 Gross per 24 hour  Intake 820 ml  Output 1275 ml  Net -455 ml        Physical Exam: Vital Signs Blood pressure 116/65, pulse 74, temperature 97.8 F (36.6 C), temperature source Oral, resp. rate 18, height 6' 2.5 (1.892 m), weight 79.2 kg, SpO2 98%.    General: No acute distress Mood and affect are appropriate Heart: Regular rate and rhythm no rubs murmurs or extra sounds Lungs: Clear to auscultation, breathing unlabored, no rales or wheezes Abdomen: Positive bowel sounds, soft nontender to palpation, nondistended Extremities: No clubbing, cyanosis, or edema   Skin: No evidence of breakdown, no evidence of rash, cervical incision CDI and covered, bilateral knee abrasions multiple some epithelialized some not, no lacerations Neuro: alert, very HOH even with hearing aid. Likely some cognitive delay also. CN exam non-focal. : motor strength is 4/5 in bilateral deltoid, bicep, tricep, grip, hip flexor, knee extensors, ankle dorsiflexor and plantar flexor Sensory exam normal sensation to light touch in bilateral upper and lower extremities Cerebellar exam normal finger to nose to finger  Musculoskeletal: Full range of motion in all 4 extremities. No joint swelling  Assessment/Plan: 1. Functional deficits which require 3+ hours per day of interdisciplinary therapy in a  comprehensive inpatient rehab setting. Physiatrist is providing close team supervision and 24 hour management of active medical problems listed below. Physiatrist and rehab team continue to assess barriers to discharge/monitor patient progress toward functional and medical goals  Care Tool:  Bathing    Body parts bathed by patient: Right arm, Left arm, Chest, Abdomen, Front perineal area, Buttocks, Right upper leg, Left upper leg, Face   Body parts bathed by helper: Right lower leg, Left lower leg     Bathing assist Assist Level: Minimal Assistance - Patient > 75%     Upper Body Dressing/Undressing Upper body dressing   What is the patient wearing?: Pull over shirt    Upper body assist Assist Level: Supervision/Verbal cueing    Lower Body Dressing/Undressing Lower body dressing      What is the patient wearing?: Underwear/pull up, Pants     Lower body assist Assist for lower body dressing: Minimal Assistance - Patient > 75%     Toileting Toileting    Toileting assist Assist for toileting: Minimal Assistance - Patient > 75%     Transfers Chair/bed transfer  Transfers assist     Chair/bed transfer assist level: Contact Guard/Touching assist     Locomotion Ambulation  Ambulation assist      Assist level: Minimal Assistance - Patient > 75% Assistive device: Walker-rolling Max distance: 90   Walk 10 feet activity   Assist     Assist level: Minimal Assistance - Patient > 75% Assistive device: Walker-rolling   Walk 50 feet activity   Assist    Assist level: Minimal Assistance - Patient > 75% Assistive device: Walker-rolling    Walk 150 feet activity   Assist Walk 150 feet activity did not occur: Safety/medical concerns (fatigue)         Walk 10 feet on uneven surface  activity   Assist     Assist level: Minimal Assistance - Patient > 75% Assistive device: Walker-rolling   Wheelchair     Assist Is the patient using a  wheelchair?: Yes Type of Wheelchair: Manual    Wheelchair assist level: Dependent - Patient 0%      Wheelchair 50 feet with 2 turns activity    Assist            Wheelchair 150 feet activity     Assist      Assist Level: Dependent - Patient 0%   Blood pressure 116/65, pulse 74, temperature 97.8 F (36.6 C), temperature source Oral, resp. rate 18, height 6' 2.5 (1.892 m), weight 79.2 kg, SpO2 98%.  Medical Problem List and Plan: 1. Functional deficits secondary to odontoid fracture, NO COLLAR NEEDED per nsgy             -patient may  shower if incision is covered             -ELOS/Goals: 5-7d Sup/minA  -Continue CIR therapies including PT, OT   Team conf in am  2.  Antithrombotics: -DVT/anticoagulation:  Mechanical: Sequential compression devices, below knee Bilateral lower extremities             -antiplatelet therapy: N/A  3. Pain Management: Oxycodone  prn. Tylenol  prn  4. Mood/Behavior/Sleep: LCSW to follow for evaluation and support             --Melatonin prn for sleep.              -antipsychotic agents: N/A  5. Neuropsych/cognition: This patient appears to be capable of making decisions on his own behalf. -11/24 very HOH. Will see what team thinks about SLP for him once they have time to work with him. He has some mild cog deficits related to his baseline alzheimer's .   6. Skin/Wound Care: routine pressure relief measures and skin care for wounds. Neosporin ordered.  -06/18/24 daughter wants WOC to see him about his abrasions; defer to weekday team, also note that his sutures on his face were put in on 11/19---remove in about a week--11/26  11/24 continue local care to wounds, discuss with RN 7. Fluids/Electrolytes/Nutrition: Monitor I/O and routine labs, continue vitamins/supplements.   -Hyponatremia as below #11  8. Chronic rhinitis/allergies: Has chronic cough a/w this --continue Flonase  and Breo inhaler.    9. BPH: Managed with Proscar  5mg   daily  10. Alzheimer's dementia: Per family decline over  past couple of years. Tends to repeat himself             --very HOH. Dry erase board for communication needs.   Use hearing aides when possible   11. Acute on chronic hyponatremia: Baseline Na-132-->128             --Question SIADH v/s cerebral salt wasting.   -06/18/24 Na 129, stable, monitor twice weekly.    -  11/24 sodium holding at 129. He's not far from his baseline 12. Constipation: 06/19/24 no BM since 11/21    -will give him a dose of sorbitol  today and add prn miralax  13. Leukocytosis: gradually improving 17.6>15.9>10.8>10.6. Likely postoperative.  11/24 down to 9.9 -still no s/sx of infection. Monitor twice weekly.     LOS: 3 days A FACE TO FACE EVALUATION WAS PERFORMED  Prentice FORBES Compton 06/20/2024, 8:14 AM

## 2024-06-20 NOTE — Plan of Care (Signed)
  Problem: Consults Goal: RH SPINAL CORD INJURY PATIENT EDUCATION Description:  See Patient Education module for education specifics.  Outcome: Progressing   Problem: SCI BOWEL ELIMINATION Goal: RH STG MANAGE BOWEL WITH ASSISTANCE Description: STG Manage Bowel with mod I Assistance. Outcome: Progressing Goal: RH STG SCI MANAGE BOWEL WITH MEDICATION WITH ASSISTANCE Description: STG SCI Manage bowel with medication with mod I assistance. Outcome: Progressing   Problem: SCI BLADDER ELIMINATION Goal: RH STG MANAGE BLADDER WITH ASSISTANCE Description: STG Manage Bladder With toileting Assistance Outcome: Progressing   Problem: RH SAFETY Goal: RH STG ADHERE TO SAFETY PRECAUTIONS W/ASSISTANCE/DEVICE Description: STG Adhere to Safety Precautions With cues Assistance/Device. Outcome: Progressing   Problem: RH PAIN MANAGEMENT Goal: RH STG PAIN MANAGED AT OR BELOW PT'S PAIN GOAL Description: < 4 with prns  Outcome: Progressing   Problem: RH KNOWLEDGE DEFICIT SCI Goal: RH STG INCREASE KNOWLEDGE OF SELF CARE AFTER SCI Description: Patient will be able to direct care needs at discharge using educational resources independnently Outcome: Progressing

## 2024-06-21 MED ORDER — ACETAMINOPHEN 325 MG PO TABS
650.0000 mg | ORAL_TABLET | Freq: Three times a day (TID) | ORAL | Status: DC
  Administered 2024-06-21 – 2024-06-26 (×19): 650 mg via ORAL
  Filled 2024-06-21 (×20): qty 2

## 2024-06-21 NOTE — Progress Notes (Signed)
 Speech Language Pathology Daily Session Note  Patient Details  Name: Adrian Ray MRN: [id number] Date of Birth: 07-13-1934  Today's Date: 06/21/2024 SLP Individual Time: 8684-8640 SLP Individual Time Calculation (min): 44 min  Short Term Goals: Week 1: SLP Short Term Goal 1 (Week 1): Patient will demonstrate problem solving skills during functional situations given mod multimodal A SLP Short Term Goal 2 (Week 1): Patient will recall daily information given mod multimodal A SLP Short Term Goal 3 (Week 1): Patient will demonstrate auditory comprehension during participation in complex conversation given mod multimodal A  Skilled Therapeutic Interventions: Skilled therapy session focused on cognitive goals. SLP facilitated session by prompting patient to recall activities of PT/OT this AM. Patient recalled broad events independently and requested SLP to utilize white board to communicate due to being [facility]. SLP targeted problem solving goals through money management task. Patient required mod-maxA to count change according to verbalized amount. Patient required extra processing time to complete task. At the end of the activity, patient reports it was easy despite requiring significant assistance to complete task. SLP educated to increased awareness. Patient left in bed with alarm set and call bell in reach. Continue POC   Pain None reported to SLP  Therapy/Group: Individual Therapy  Leibish Mcgregor M.A., CCC-SLP 06/21/2024, 7:41 AM

## 2024-06-21 NOTE — Progress Notes (Signed)
 Occupational Therapy Session Note  Patient Details  Name: Adrian Ray MRN: [id number] Date of Birth: [birth year]  Today's Date: 06/21/2024 OT Individual Time: 0935-1000 OT Individual Time Calculation (min): 25 min    Short Term Goals: Week 1:  OT Short Term Goal 1 (Week 1): PT will be able to perform sit to stands with min cues for proper hand placement and with contact guard during ADL tasks OT Short Term Goal 2 (Week 1): Pt will be able to thread LB (underwear and pants) with setup OT Short Term Goal 3 (Week 1): Pt will be able to button pants in standing using both hands with contact guard (decr posterior lean)  Skilled Therapeutic Interventions/Progress Updates:    Pt received in wc requesting to use the bathroom. Pt completed sit to stand with close S with cues to bring feet back under knees and push to stand up.   Pt then ambulated with light CGA and RW to toilet. Used bars to sit on low toilet with min A to lower slowly as toilet seat low.   Pt voided gas (no bowel movement), pt did have soiled underwear so helped him doff to avoid getting it on his legs. Because he had been soiled., had pt stand to self cleanse front and back with warm washcloths.  Pt then ambulated to wc to dress.   He used figure 4 position of crossed legs to don underwear and shorts over feet. Pt then stood and pulled over hips, fastening zipper and button himself.   Doffed tshirt and donned button up shirt with A only to pull around his back.   Pt tends to ask for help first, and needs reminding he is on rehab to work towards his level of independence,   Also recommended (and wrote reminder on white board) to call for A with urinal as I believe he tends to spill it when using it himself in the am.  I am often noticing urine on his clothing and he is not incontinent.   Pt returned to w/c and NT in room to get his alarm belt on.   Therapy Documentation Precautions:  Precautions Precautions: Fall,  Cervical Recall of Precautions/Restrictions: Impaired Restrictions Weight Bearing Restrictions Per Provider Order: No Other Position/Activity Restrictions: cervical; no pushing/pulling lifting more than 5-10 lbs      Pain: Pain Assessment Pain Scale: 0-10 Pain Score: 8  Pain Location: Neck - RN to provide medication   ADL: ADL Eating: Set up Where Assessed-Eating: Bed level Grooming: Setup Where Assessed-Grooming: Sitting at sink Upper Body Bathing: Supervision/safety Where Assessed-Upper Body Bathing: Shower Lower Body Bathing: Minimal assistance Where Assessed-Lower Body Bathing: Shower Upper Body Dressing: Supervision/safety Where Assessed-Upper Body Dressing: Edge of bed Lower Body Dressing:  (Supervision underwear and pants, A for TED hose and min for shoes) Where Assessed-Lower Body Dressing: Edge of bed Toileting: Supervision/safety Where Assessed-Toileting: Teacher, Adult Education: Furniture Conservator/restorer Method: Proofreader: Acupuncturist: Administrator, Arts Method: Designer, Industrial/product: Shower seat without back, Grab bars     Therapy/Group: Individual Therapy  Thursa Emme 06/21/2024, 10:57 AM

## 2024-06-21 NOTE — Progress Notes (Signed)
 Occupational Therapy Note  Patient Details  Name: KANTON KAMEL MRN: [id number] Date of Birth: 11-19-1933  Occupational Therapist participated in the interdisciplinary team conference, providing clinical information regarding the patient's current status, treatment goals, and weekly focus, including any barriers that need to be addressed. Please see the Inpatient Rehabilitation Team Conference and Plan of Care Update for further details.       Daeton Kluth 06/21/2024, 10:26 AM

## 2024-06-21 NOTE — Patient Care Conference (Signed)
 Inpatient RehabilitationTeam Conference and Plan of Care Update Date: 06/21/2024   Time: 10:15 AM    Patient Name: Adrian Ray      Medical Record Number: [id number]  Date of Birth: 1934-04-28 Sex: Male         Room/Bed: 4M10C/4M10C-01 Payor Info: Payor: MEDICARE / Plan: MEDICARE PART A AND B / Product Type: *No Product type* /    Admit Date/Time:  06/17/2024  6:04 PM  Primary Diagnosis:  Odontoid fracture [facility])  Hospital Problems: Principal Problem:   Odontoid fracture [facility])    Expected Discharge Date: Expected Discharge Date: 06/26/24  Team Members Present: Physician leading conference: Dr. Prentice Compton Social Worker Present: Rhoda Clement, LCSW Nurse Present: Barnie Ronde, RN PT Present: Delinda Bertrand, PTA;Sherlean Perks, PT OT Present: Julia Saguier, OT SLP Present: Blaise Alderman, SLP PPS Coordinator present : Eleanor Colon, SLP     Current Status/Progress Goal Weekly Team Focus  Bowel/Bladder   Patient incontinent of urine intermittently.   Patient remains continent of bowel and bladder.   Assess toileting needs q shift and as needed.    Swallow/Nutrition/ Hydration               ADL's   (P) min A LB self care, supervision UB self care, CGA sit to stand and ambulation with RW to complete toilet and shower transfers with bars   (P) supervision   (P) ADL training, functional mobility, balance, pt education    Mobility   CGA/minA bed mobility; minA transfers; close supervision gait w/ RW;   Supervision  Barriers: HOH; Focus - therex, pt/family education, gait, transfers, bed mobility    Communication   ? auditory comp deficits vs HOH   min   auditory comp at complex conversational level    Safety/Cognition/ Behavioral Observations  mod-severe deficits in memory and problem solving   min   STM, basic problem solving and safety awareness    Pain   Patient denies pain at this time.   Patient remains free of pain or any other discomfort.    Assess pain level q shift and as needed.    Skin     Sutures to forehead Abrasions to knees and abrasions to face   Abrasions healing    Neosporin to abrasions/dressing to knees     Discharge Planning:  Going to [facility] rehba and then transtion to ALF. Daughter's want him to get the maximum rehab here prior to going back to [facility]   Team Discussion: Patient admitted post polytrauma with C2 fracture; C1 - 2 fusion post fall with mild dementia  on fluid restriction.  Limited by cognition, needs reminders for safety and learned helplessness/dependence with pain.  Patient on target to meet rehab goals: yes, needs min assist - CGA for sit - stand,  using adaptive equipment for ADLs. Goals for discharge set for supervision overall.  *See Care Plan and progress notes for long and short-term goals.   Revisions to Treatment Plan:  N/a   Teaching Needs: Safety, medications, transfers, toileting, etc.   Current Barriers to Discharge: Decreased caregiver support  Possible Resolutions to Barriers: Family education     Medical Summary Current Status: Neck pain , has multiple abrasions  Barriers to Discharge: Uncontrolled Pain   Possible Resolutions to Barriers/Weekly Focus: going to SNF after rehab   Continued Need for Acute Rehabilitation Level of Care: The patient requires daily medical management by a physician with specialized training in physical medicine and rehabilitation for the  following reasons: Direction of a multidisciplinary physical rehabilitation program to maximize functional independence : Yes Medical management of patient stability for increased activity during participation in an intensive rehabilitation regime.: Yes Analysis of laboratory values and/or radiology reports with any subsequent need for medication adjustment and/or medical intervention. : Yes   I attest that I was present, lead the team conference, and concur with the assessment and plan of  the team.   Fredericka Sober B 06/21/2024, 2:06 PM

## 2024-06-21 NOTE — Plan of Care (Signed)
 Goals downgraded due to shortened LOS Problem: RH Problem Solving Goal: LTG Patient will demonstrate problem solving for (SLP) Description: LTG:  Patient will demonstrate problem solving for basic/complex daily situations with cues  (SLP) Flowsheets (Taken 06/21/2024 1400) LTG: Patient will demonstrate problem solving for (SLP): Basic daily situations LTG Patient will demonstrate problem solving for: Moderate Assistance - Patient 50 - 74%   Problem: RH Memory Goal: LTG Patient will demonstrate ability for day to day (SLP) Description: LTG:   Patient will demonstrate ability for day to day recall/carryover during cognitive/linguistic activities with assist  (SLP) Flowsheets Taken 06/21/2024 1400 LTG: Patient will demonstrate ability for day to day recall/carryover during cognitive/linguistic activities with assist (SLP): Moderate Assistance - Patient 50 - 74% Taken 06/20/2024 0922 LTG: Patient will demonstrate ability for day to day recall: New information

## 2024-06-21 NOTE — NC FL2 (Signed)
 [place]  MEDICAID FL2 LEVEL OF CARE FORM     IDENTIFICATION  Patient Name: Adrian Ray Birthdate: 12-25-1933 Sex: male Admission Date (Current Location): 06/17/2024  [facility] and [place] Number:  Producer, Television/film/video and Address:  [facility] [place], [address] [place], [place] [address]      Provider Number: [id number]  Attending Physician Name and Address:  [name], MD  Relative Name and Phone Number:  Geni Kimberling-daughter [facility] [phone]    Current Level of Care: Other (Comment) (Rehab) Recommended Level of Care: Skilled Nursing Facility Prior Approval Number:    Date Approved/Denied:   PASRR Number: [id number]  Discharge Plan: SNF    Current Diagnoses: Patient Active Problem List   Diagnosis Date Noted   Odontoid fracture (HCC) 06/17/2024   C2 cervical fracture (HCC) 06/14/2024   Dens fracture (HCC) 06/14/2024   Fall 06/14/2024   History of COPD 06/14/2024   C1-C2 vertebral instability 06/13/2024   Hypertrophy of nasal turbinates 03/07/2024   Chronic rhinitis 03/07/2024   Asthma 08/09/2023   Bronchiectasis without complication (HCC) 08/09/2023   BPH (benign prostatic hyperplasia) 11/19/2022   Delirium 10/23/2020   Leukocytosis 10/23/2020   Chest pain 10/23/2020   Acute UTI 10/22/2020   Actinic keratosis 01/16/2016   Acute bronchitis 01/16/2016   Allergic rhinitis 01/16/2016   Diverticulosis of large intestine without perforation or abscess without bleeding 01/16/2016   Frequency of micturition 01/16/2016   Hearing loss of both ears 01/16/2016   History of colonic polyps 01/16/2016   Hyperlipidemia 01/16/2016   Nocturia 01/16/2016   Osteoarthritis of knee, unspecified 01/16/2016   Other general symptoms and signs 01/16/2016   Other long term (current) drug therapy 01/16/2016   Pain in shoulder 01/16/2016   Polyp of nasal cavity 01/16/2016   Pure hypercholesterolemia 01/16/2016   Uncomplicated asthma 01/16/2016    Deviated nasal septum 07/29/2012   Nasal obstruction 07/29/2012   Eustachian tube dysfunction 07/29/2012   Sensorineural hearing loss, bilateral 07/29/2012   Tinnitus 07/29/2012    Orientation RESPIRATION BLADDER Height & Weight     Self, Situation, Place, Time  Normal Continent Weight: 174 lb 9.7 oz (79.2 kg) Height:  6' 2.5 (189.2 cm)  BEHAVIORAL SYMPTOMS/MOOD NEUROLOGICAL BOWEL NUTRITION STATUS      Continent Diet (Regular thin liquids)  AMBULATORY STATUS COMMUNICATION OF NEEDS Skin   Supervision Verbally Surgical wounds                       Personal Care Assistance Level of Assistance  Bathing, Dressing Bathing Assistance: Limited assistance   Dressing Assistance: Limited assistance     Functional Limitations Info  Hearing, Speech   Hearing Info: Impaired Speech Info: Impaired    SPECIAL CARE FACTORS FREQUENCY  PT (By licensed PT), OT (By licensed OT), Speech therapy     PT Frequency: 5x week OT Frequency: 5x week     Speech Therapy Frequency: 5 x week      Contractures Contractures Info: Not present    Additional Factors Info  Code Status, Allergies Code Status Info: DNR Allergies Info: Tetracycline, Codeine, Shellfish allergy            Current Medications (06/21/2024):  This is the current hospital active medication list Current Facility-Administered Medications  Medication Dose Route Frequency Provider Last Rate Last Admin   acetaminophen  (TYLENOL ) tablet 650 mg  650 mg Oral TID WC & HS Love, Pamela S, PA-C   650 mg at 06/21/24  9076   albuterol  (PROVENTIL ) (2.5 MG/3ML) 0.083% nebulizer solution 2.5 mg  2.5 mg Nebulization Q4H PRN Love, Pamela S, PA-C       alum & mag hydroxide-simeth (MAALOX/MYLANTA) 200-200-20 MG/5ML suspension 30 mL  30 mL Oral Q4H PRN Love, Pamela S, PA-C       bisacodyl  (DULCOLAX) suppository 10 mg  10 mg Rectal Daily PRN Love, Pamela S, PA-C       cyanocobalamin  (VITAMIN B12) tablet 500 mcg  500 mcg Oral Daily Love,  Pamela S, PA-C   500 mcg at 06/21/24 9076   cyclobenzaprine  (FLEXERIL ) tablet 5 mg  5 mg Oral TID PRN Love, Pamela S, PA-C   5 mg at 06/21/24 9076   diphenhydrAMINE  (BENADRYL ) capsule 25 mg  25 mg Oral Q6H PRN Love, Pamela S, PA-C   25 mg at 06/18/24 9781   docusate sodium  (COLACE) capsule 200 mg  200 mg Oral Daily [address] [place], PA-C   200 mg at 06/21/24 9076   finasteride  (PROSCAR ) tablet 5 mg  5 mg Oral Daily Love, Pamela S, PA-C   5 mg at 06/21/24 9076   fluticasone  (FLONASE ) 50 MCG/ACT nasal spray 2 spray  2 spray Each Nare Daily Maurice Sharlet RAMAN, PA-C   2 spray at 06/21/24 9076   fluticasone  furoate-vilanterol (BREO ELLIPTA ) 100-25 MCG/ACT 1 puff  1 puff Inhalation Daily Maurice Sharlet RAMAN, PA-C   1 puff at 06/21/24 9077   guaiFENesin -dextromethorphan (ROBITUSSIN DM) 100-10 MG/5ML syrup 5-10 mL  5-10 mL Oral Q6H PRN Love, Pamela S, PA-C       melatonin tablet 5 mg  5 mg Oral QHS PRN Love, Pamela S, PA-C   5 mg at 06/20/24 1954   naloxone  (NARCAN ) injection 0.4 mg  0.4 mg Intravenous PRN Love, Pamela S, PA-C       neomycin -bacitracin -polymyxin (NEOSPORIN) ointment   Topical BID Love, Pamela S, PA-C   Given at 06/21/24 9076   oxyCODONE  (Oxy IR/ROXICODONE ) immediate release tablet 2.5-5 mg  2.5-5 mg Oral Q4H PRN Love, Pamela S, PA-C   5 mg at 06/20/24 1426   polyethylene glycol (MIRALAX  / GLYCOLAX ) packet 17 g  17 g Oral Daily PRN Swartz, Zachary T, MD   17 g at 06/21/24 9074   prochlorperazine  (COMPAZINE ) tablet 5-10 mg  5-10 mg Oral Q6H PRN Love, Pamela S, PA-C       Or   prochlorperazine  (COMPAZINE ) suppository 12.5 mg  12.5 mg Rectal Q6H PRN Love, Pamela S, PA-C       Or   prochlorperazine  (COMPAZINE ) injection 5-10 mg  5-10 mg Intravenous Q6H PRN Love, Pamela S, PA-C       sodium phosphate (FLEET) enema 1 enema  1 enema Rectal Once PRN Love, Pamela S, PA-C       sorbitol  70 % solution 30 mL  30 mL Oral Daily PRN Swartz, Zachary T, MD   30 mL at 06/21/24 9076     Discharge  Medications: Please see discharge summary for a list of discharge medications.  Relevant Imaging Results:  Relevant Lab Results:   Additional Information SSN:[id number]  Kamori Barbier, [place] MATSU, LCSW

## 2024-06-21 NOTE — Progress Notes (Signed)
 Physical Therapy Session Note  Patient Details  Name: Adrian Ray MRN: [id number] Date of Birth: [birth year]  Today's Date: 06/21/2024 PT Individual Time: 0820-0915; 1035 - 1115 PT Individual Time Calculation (min): 55 min; 40 min   Short Term Goals: Week 1:  PT Short Term Goal 1 (Week 1): pt will perorm sit to stand with LRAD and CGA PT Short Term Goal 2 (Week 1): pt will perform bed to chair tansfer with LRAD and CGA PT Short Term Goal 3 (Week 1): Pt will ambulated 150 feet with LRAD and CGA  SESSION 1 Skilled Therapeutic Interventions/Progress Updates: Patient semi-reclined in bed asleep on entrance to room. Patient awakened by shoulder tapping and became alert and agreeable to PT session.   Patient reported desire to sit on EOB to eat breakfast. Pt HOH and required white board to effectively communicate throughout session. PTA provided pt with soapy wash cloth and pt performed self hygiene on hands and face with cues to recall that pt has stitches still in place on forehead. Pt performed B SLR while PTA donned B TED's. Pt performed semi-reclined<sit on EOB with supervision and increased time to do so. Pt required totalA to donn personal shoes. On return to room, pt requested more time to eat. Pt encouraged to finish rest of meal following PT session to avoid missing further minutes. Pt agreeable. Pt transported to main gym and performed therex. Pt reported 10/10 pain posterior cervical neck around incision. PTA communicated with attending nsg to provide medication. Pt stated that current medication being provided is not helping (PTA further communicated to medical team and attending physician).   Therapeutic Exercise: Pt performed the following exercises with therapist providing the described cuing and facilitation for improvement. - Pt performed x 10 sit<>stand from WC level with overall minA and a few reps of CGA. Pt required frequent multimodal cuing to reach back to control descent. Pt  also required cuing to increase upright standing posture once in RW frame to avoid posterior LOB. Pt required increased time/effort to perform.  Patient sitting in WC at end of session with brakes locked, belt alarm set, and all needs within reach.  SESSION 2 Skilled Therapeutic Interventions/Progress Updates: Patient sitting in WC on entrance to room. Patient alert and agreeable to PT session.   Patient reported unrated pain (rest breaks provided).   Therapeutic Activity: Bed Mobility: Pt performed sit<supine from EOB with supervision for safety.  Transfers: Pt performed sit<>stand transfers throughout session with supervision. Provided multimodal cues for pt to reach back to control descent.  Therapeutic Exercise: Pt performed the following exercises in order to improve cardiovascular endurance.  - Pt ambulated roughly 200' with supervision in RW - NuStep - 0.4 miles; 650 total steps; 65 avg spm; 1.8 METs; 10 min; level 3 resistance with only B LE's  Patient semi-reclined at end of session with brakes locked, nsg present, bed alarm set, and all needs within reach.       Therapy Documentation Precautions:  Precautions Precautions: Fall, Cervical Recall of Precautions/Restrictions: Impaired Restrictions Weight Bearing Restrictions Per Provider Order: No Other Position/Activity Restrictions: cervical; no pushing/pulling lifting more than 5-10 lbs  Therapy/Group: Individual Therapy  Ruqayyah Lute PTA 06/21/2024, 10:56 AM

## 2024-06-21 NOTE — Progress Notes (Signed)
 Sutures removed per order. Patient tolerated well. Site is clean, dry and intact. Ointment applied.   Adrian Ray

## 2024-06-21 NOTE — Progress Notes (Signed)
 PROGRESS NOTE   Subjective/Complaints:  Pt states pain meds not working, cannot say whether pain is interfering with therapy  Has only taken 3 doses of oxy IR 5mg  since admission   ROS: Patient denies CP, SOB, N/V/D  Objective:   No results found.  Recent Labs    06/19/24 0455  WBC 9.2  HGB 13.8  HCT 39.3  PLT 261   Recent Labs    06/19/24 0455  NA 129*  K 4.1  CL 95*  CO2 24  GLUCOSE 94  BUN 10  CREATININE 0.71  CALCIUM 8.6*        Intake/Output Summary (Last 24 hours) at 06/21/2024 0828 Last data filed at 06/21/2024 0505 Gross per 24 hour  Intake 980 ml  Output 625 ml  Net 355 ml        Physical Exam: Vital Signs Blood pressure 120/69, pulse 73, temperature 97.6 F (36.4 C), resp. rate 18, height 6' 2.5 (1.892 m), weight 79.2 kg, SpO2 96%.    General: No acute distress Mood and affect are appropriate Heart: Regular rate and rhythm no rubs murmurs or extra sounds Lungs: Clear to auscultation, breathing unlabored, no rales or wheezes Abdomen: Positive bowel sounds, soft nontender to palpation, nondistended Extremities: No clubbing, cyanosis, or edema   Skin: No evidence of breakdown, no evidence of rash, cervical incision CDI and covered, bilateral knee abrasions multiple some epithelialized some not, no lacerations Neuro: alert, very HOH even with hearing aid. Likely some cognitive delay also. CN exam non-focal. : motor strength is 4/5 in bilateral deltoid, bicep, tricep, grip, hip flexor, knee extensors, ankle dorsiflexor and plantar flexor Sensory exam normal sensation to light touch in bilateral upper and lower extremities Cerebellar exam normal finger to nose to finger  Musculoskeletal: Full range of motion in all 4 extremities. No joint swelling  Assessment/Plan: 1. Functional deficits which require 3+ hours per day of interdisciplinary therapy in a comprehensive inpatient rehab  setting. Physiatrist is providing close team supervision and 24 hour management of active medical problems listed below. Physiatrist and rehab team continue to assess barriers to discharge/monitor patient progress toward functional and medical goals  Care Tool:  Bathing    Body parts bathed by patient: Right arm, Left arm, Chest, Abdomen, Front perineal area, Buttocks, Right upper leg, Left upper leg, Face   Body parts bathed by helper: Right lower leg, Left lower leg     Bathing assist Assist Level: Minimal Assistance - Patient > 75%     Upper Body Dressing/Undressing Upper body dressing   What is the patient wearing?: Pull over shirt    Upper body assist Assist Level: Supervision/Verbal cueing    Lower Body Dressing/Undressing Lower body dressing      What is the patient wearing?: Underwear/pull up, Pants     Lower body assist Assist for lower body dressing: Contact Guard/Touching assist     Toileting Toileting    Toileting assist Assist for toileting: Minimal Assistance - Patient > 75%     Transfers Chair/bed transfer  Transfers assist     Chair/bed transfer assist level: Contact Guard/Touching assist     Locomotion Ambulation   Ambulation assist  Assist level: Minimal Assistance - Patient > 75% Assistive device: Walker-rolling Max distance: 90   Walk 10 feet activity   Assist     Assist level: Minimal Assistance - Patient > 75% Assistive device: Walker-rolling   Walk 50 feet activity   Assist    Assist level: Minimal Assistance - Patient > 75% Assistive device: Walker-rolling    Walk 150 feet activity   Assist Walk 150 feet activity did not occur: Safety/medical concerns (fatigue)         Walk 10 feet on uneven surface  activity   Assist     Assist level: Minimal Assistance - Patient > 75% Assistive device: Walker-rolling   Wheelchair     Assist Is the patient using a wheelchair?: Yes Type of Wheelchair:  Manual    Wheelchair assist level: Dependent - Patient 0%      Wheelchair 50 feet with 2 turns activity    Assist            Wheelchair 150 feet activity     Assist      Assist Level: Dependent - Patient 0%   Blood pressure 120/69, pulse 73, temperature 97.6 F (36.4 C), resp. rate 18, height 6' 2.5 (1.892 m), weight 79.2 kg, SpO2 96%.  Medical Problem List and Plan: 1. Functional deficits secondary to odontoid fracture, NO COLLAR NEEDED per nsgy             -patient may  shower if incision is covered             -ELOS/Goals: 5-7d Sup/minA  -Continue CIR therapies including PT, OT  Team conference today please see physician documentation under team conference tab, met with team  to discuss problems,progress, and goals. Formulized individual treatment plan based on medical history, underlying problem and comorbidities.  2.  Antithrombotics: -DVT/anticoagulation:  Mechanical: Sequential compression devices, below knee Bilateral lower extremities             -antiplatelet therapy: N/A  3. Pain Management: Oxycodone  prn. Tylenol  prn  4. Mood/Behavior/Sleep: LCSW to follow for evaluation and support             --Melatonin prn for sleep.              -antipsychotic agents: N/A  5. Neuropsych/cognition: This patient appears to be capable of making decisions on his own behalf. -11/24 very HOH. Will see what team thinks about SLP for him once they have time to work with him. He has some mild cog deficits related to his baseline alzheimer's .   6. Skin/Wound Care: routine pressure relief measures and skin care for wounds. Neosporin ordered.  -06/18/24 daughter wants WOC to see him about his abrasions; defer to weekday team, also note that his sutures on his face were put in on 11/19---remove in about a week--11/26  11/24 continue local care to wounds, discuss with RN 7. Fluids/Electrolytes/Nutrition: Monitor I/O and routine labs, continue vitamins/supplements.    -Hyponatremia as below #11  8. Chronic rhinitis/allergies: Has chronic cough a/w this --continue Flonase  and Breo inhaler.    9. BPH: Managed with Proscar  5mg  daily  10. Alzheimer's dementia- Mild : Per family decline over  past couple of years. Tends to repeat himself             --very HOH. Dry erase board for communication needs.   Use hearing aides when possible   11. Acute on chronic hyponatremia: Baseline Na-132-->128             --  Question SIADH v/s cerebral salt wasting.   -06/18/24 Na 129, stable, monitor twice weekly.    -11/24 sodium holding at 129. He's not far from his baseline 12. Constipation: 06/19/24 no BM since 11/21    -will give him a dose of sorbitol  today and add prn miralax  13. Leukocytosis: gradually improving 17.6>15.9>10.8>10.6. Likely postoperative.  11/24 down to 9.9 -still no s/sx of infection. Monitor twice weekly.     LOS: 4 days A FACE TO FACE EVALUATION WAS PERFORMED  Adrian Ray 06/21/2024, 8:28 AM

## 2024-06-21 NOTE — Progress Notes (Addendum)
 Patient ID: Adrian Ray, male   DOB: 11/18/1933, 89 y.o.   MRN: [id number]  Left message for Erica-daughter to let know team conference goals and target discharge date of 12/1. Reached out to [facility] and found out Cierra-Admission SW is on vacation until 12/1, but did speak with laura Floretta covering SW to let know target discharge date and ask what is needed paperwork wise. Will do FL2 and sent over to them and await daughter's return call.  1:53 PM Have sent FL2 to [facility].

## 2024-06-22 LAB — CBC
HCT: 39.3 % (ref 39.0–52.0)
Hemoglobin: 13.5 g/dL (ref 13.0–17.0)
MCH: 32.1 pg (ref 26.0–34.0)
MCHC: 34.4 g/dL (ref 30.0–36.0)
MCV: 93.3 fL (ref 80.0–100.0)
Platelets: 331 K/uL (ref 150–400)
RBC: 4.21 MIL/uL — ABNORMAL LOW (ref 4.22–5.81)
RDW: 12.6 % (ref 11.5–15.5)
WBC: 6.1 K/uL (ref 4.0–10.5)
nRBC: 0 % (ref 0.0–0.2)

## 2024-06-22 LAB — BASIC METABOLIC PANEL WITH GFR
Anion gap: 7 (ref 5–15)
BUN: 8 mg/dL (ref 8–23)
CO2: 27 mmol/L (ref 22–32)
Calcium: 8.6 mg/dL — ABNORMAL LOW (ref 8.9–10.3)
Chloride: 95 mmol/L — ABNORMAL LOW (ref 98–111)
Creatinine, Ser: 0.77 mg/dL (ref 0.61–1.24)
GFR, Estimated: >60 mL/min (ref >60)
Glucose, Bld: 103 mg/dL — ABNORMAL HIGH (ref 70–99)
Potassium: 4.2 mmol/L (ref 3.5–5.1)
Sodium: 129 mmol/L — ABNORMAL LOW (ref 135–145)

## 2024-06-22 MED ORDER — SORBITOL 70 % SOLN
45.0000 mL | Freq: Once | Status: AC
  Administered 2024-06-22: 45 mL via ORAL
  Filled 2024-06-22: qty 60

## 2024-06-22 NOTE — Plan of Care (Signed)
  Problem: Consults Goal: RH SPINAL CORD INJURY PATIENT EDUCATION Description:  See Patient Education module for education specifics.  Outcome: Progressing   Problem: SCI BOWEL ELIMINATION Goal: RH STG MANAGE BOWEL WITH ASSISTANCE Description: STG Manage Bowel with mod I Assistance. Outcome: Progressing Goal: RH STG SCI MANAGE BOWEL WITH MEDICATION WITH ASSISTANCE Description: STG SCI Manage bowel with medication with mod I assistance. Outcome: Progressing   Problem: SCI BLADDER ELIMINATION Goal: RH STG MANAGE BLADDER WITH ASSISTANCE Description: STG Manage Bladder With toileting Assistance Outcome: Progressing   Problem: RH SAFETY Goal: RH STG ADHERE TO SAFETY PRECAUTIONS W/ASSISTANCE/DEVICE Description: STG Adhere to Safety Precautions With cues Assistance/Device. Outcome: Progressing   Problem: RH PAIN MANAGEMENT Goal: RH STG PAIN MANAGED AT OR BELOW PT'S PAIN GOAL Description: < 4 with prns  Outcome: Progressing   Problem: RH KNOWLEDGE DEFICIT SCI Goal: RH STG INCREASE KNOWLEDGE OF SELF CARE AFTER SCI Description: Patient will be able to direct care needs at discharge using educational resources independnently Outcome: Progressing

## 2024-06-22 NOTE — Progress Notes (Signed)
 PROGRESS NOTE   Subjective/Complaints:  Pt reports is sleepy- wants to go back to sleep- denies pain-  Does reports feels dry, lips dry   ROS: limited by sleepiness-   Objective:   No results found.  Recent Labs    06/22/24 0433  WBC 6.1  HGB 13.5  HCT 39.3  PLT 331   Recent Labs    06/22/24 0433  NA 129*  K 4.2  CL 95*  CO2 27  GLUCOSE 103*  BUN 8  CREATININE 0.77  CALCIUM 8.6*        Intake/Output Summary (Last 24 hours) at 06/22/2024 1037 Last data filed at 06/22/2024 0958 Gross per 24 hour  Intake 476 ml  Output 475 ml  Net 1 ml        Physical Exam: Vital Signs Blood pressure (!) 141/56, pulse 63, temperature 98.3 F (36.8 C), resp. rate 16, height 6' 2.5 (1.892 m), weight 79.2 kg, SpO2 100%.      General: initially snoring/deep asleep- woke and asked to go back to sleep- NAD HENT: conjugate gaze; oropharynx dry and lips chapped- some scabs on face CV: regular rate; no JVD Pulmonary: CTA B/L; no W/R/R- good air movement GI: soft, NT, ND, (+)BS Psychiatric: flat- hard to wake up initially- snoring deeply Neurological: sleepy  Skin: No evidence of breakdown, no evidence of rash, cervical incision CDI and covered, bilateral knee abrasions multiple some epithelialized some not, no lacerations Neuro: alert, very HOH even with hearing aid. Likely some cognitive delay also. CN exam non-focal. : motor strength is 4/5 in bilateral deltoid, bicep, tricep, grip, hip flexor, knee extensors, ankle dorsiflexor and plantar flexor Sensory exam normal sensation to light touch in bilateral upper and lower extremities Cerebellar exam normal finger to nose to finger  Musculoskeletal: Full range of motion in all 4 extremities. No joint swelling  Assessment/Plan: 1. Functional deficits which require 3+ hours per day of interdisciplinary therapy in a comprehensive inpatient rehab setting. Physiatrist is  providing close team supervision and 24 hour management of active medical problems listed below. Physiatrist and rehab team continue to assess barriers to discharge/monitor patient progress toward functional and medical goals  Care Tool:  Bathing    Body parts bathed by patient: Right arm, Left arm, Chest, Abdomen, Front perineal area, Buttocks, Right upper leg, Left upper leg, Face   Body parts bathed by helper: Right lower leg, Left lower leg     Bathing assist Assist Level: Minimal Assistance - Patient > 75%     Upper Body Dressing/Undressing Upper body dressing   What is the patient wearing?: Pull over shirt    Upper body assist Assist Level: Supervision/Verbal cueing    Lower Body Dressing/Undressing Lower body dressing      What is the patient wearing?: Underwear/pull up, Pants     Lower body assist Assist for lower body dressing: Supervision/Verbal cueing     Toileting Toileting    Toileting assist Assist for toileting: Supervision/Verbal cueing     Transfers Chair/bed transfer  Transfers assist     Chair/bed transfer assist level: Contact Guard/Touching assist     Locomotion Ambulation   Ambulation assist  Assist level: Minimal Assistance - Patient > 75% Assistive device: Walker-rolling Max distance: 90   Walk 10 feet activity   Assist     Assist level: Minimal Assistance - Patient > 75% Assistive device: Walker-rolling   Walk 50 feet activity   Assist    Assist level: Minimal Assistance - Patient > 75% Assistive device: Walker-rolling    Walk 150 feet activity   Assist Walk 150 feet activity did not occur: Safety/medical concerns (fatigue)         Walk 10 feet on uneven surface  activity   Assist     Assist level: Minimal Assistance - Patient > 75% Assistive device: Walker-rolling   Wheelchair     Assist Is the patient using a wheelchair?: Yes Type of Wheelchair: Manual    Wheelchair assist level:  Dependent - Patient 0%      Wheelchair 50 feet with 2 turns activity    Assist            Wheelchair 150 feet activity     Assist      Assist Level: Dependent - Patient 0%   Blood pressure (!) 141/56, pulse 63, temperature 98.3 F (36.8 C), resp. rate 16, height 6' 2.5 (1.892 m), weight 79.2 kg, SpO2 100%.  Medical Problem List and Plan: 1. Functional deficits secondary to odontoid fracture, NO COLLAR NEEDED per nsgy             -patient may  shower if incision is covered             -ELOS/Goals: 5-7d Sup/minA  Con't CIR- no therapy today Team conference today please see physician documentation under team conference tab, met with team  to discuss problems,progress, and goals. Formulized individual treatment plan based on medical history, underlying problem and comorbidities.  2.  Antithrombotics: -DVT/anticoagulation:  Mechanical: Sequential compression devices, below knee Bilateral lower extremities             -antiplatelet therapy: N/A  3. Pain Management: Oxycodone  prn. Tylenol  prn  11/27- last used oxy 11/25 4. Mood/Behavior/Sleep: LCSW to follow for evaluation and support             --Melatonin prn for sleep.              -antipsychotic agents: N/A  5. Neuropsych/cognition: This patient appears to be capable of making decisions on his own behalf. -11/24 very HOH. Will see what team thinks about SLP for him once they have time to work with him. He has some mild cog deficits related to his baseline alzheimer's .   11/27- wrote to him today on board 6. Skin/Wound Care: routine pressure relief measures and skin care for wounds. Neosporin ordered.  -06/18/24 daughter wants WOC to see him about his abrasions; defer to weekday team, also note that his sutures on his face were put in on 11/19---remove in about a week--11/26  11/24 continue local care to wounds, discuss with RN 7. Fluids/Electrolytes/Nutrition: Monitor I/O and routine labs, continue  vitamins/supplements.   -Hyponatremia as below #11  8. Chronic rhinitis/allergies: Has chronic cough a/w this --continue Flonase  and Breo inhaler.    9. BPH: Managed with Proscar  5mg  daily  10. Alzheimer's dementia- Mild : Per family decline over  past couple of years. Tends to repeat himself             --very HOH. Dry erase board for communication needs.   Use hearing aides when possible   11. Acute on chronic hyponatremia:  Baseline Na-132-->128             --Question SIADH v/s cerebral salt wasting.   -06/18/24 Na 129, stable, monitor twice weekly.    -11/24 sodium holding at 129. He's not far from his baseline  11/17- Na 129- is holding steady 12. Constipation: 06/19/24 no BM since 11/21    -will give him a dose of sorbitol  today and add prn miralax   11/27- LBM still appears to be 11/21- will give Sorbitol  and SSE if need be- ordered and d/w nursing 13. Leukocytosis: gradually improving 17.6>15.9>10.8>10.6. Likely postoperative.  11/24 down to 9.9 -still no s/sx of infection. Monitor twice weekly.   11/27- WBC 6.1k   I spent a total of 37   minutes on total care today- >50% coordination of care- due to  D/w team about pt's BM's and went over labs, vitals and B/B- also d/w nursing   LOS: 5 days A FACE TO FACE EVALUATION WAS PERFORMED  Shailey Butterbaugh 06/22/2024, 10:37 AM

## 2024-06-23 DIAGNOSIS — E871 Hypo-osmolality and hyponatremia: Secondary | ICD-10-CM | POA: Insufficient documentation

## 2024-06-23 MED ORDER — MELATONIN 5 MG PO TABS: 5.0000 mg | ORAL_TABLET | Freq: Every evening | ORAL | Status: AC | PRN

## 2024-06-23 MED ORDER — CYCLOBENZAPRINE HCL 5 MG PO TABS: 5.0000 mg | ORAL_TABLET | Freq: Three times a day (TID) | ORAL | Status: AC | PRN

## 2024-06-23 MED ORDER — DOCUSATE SODIUM 100 MG PO CAPS: 200.0000 mg | ORAL_CAPSULE | Freq: Every day | ORAL | Status: AC

## 2024-06-23 NOTE — Discharge Instructions (Addendum)
 Inpatient Rehab Discharge Instructions  Adrian Ray Discharge date and time:  06/26/24  Activities/Precautions/ Functional Status: Activity: no lifting, driving, or strenuous exercise till cleared by MD Diet: regular diet Wound Care: keep wound clean and dry   Functional status:  ___ No restrictions     ___ Walk up steps independently _X__ 24/7 supervision/assistance   ___ Walk up steps with assistance ___ Intermittent supervision/assistance  ___ Bathe/dress independently ___ Walk with walker     ___ Bathe/dress with assistance ___ Walk Independently    ___ Shower independently ___ Walk with assistance    _X__ Shower with assistance _X__ No alcohol     ___ Return to work/school ________   Special Instructions: Continue cervical precautions.   My questions have been answered and I understand these instructions. I will adhere to these goals and the provided educational materials after my discharge from the hospital.  Patient/Caregiver Signature _______________________________ Date __________  Clinician Signature _______________________________________ Date __________  Please bring this form and your medication list with you to all your follow-up doctor's appointments.

## 2024-06-23 NOTE — Plan of Care (Signed)
  Problem: Consults Goal: RH SPINAL CORD INJURY PATIENT EDUCATION Description:  See Patient Education module for education specifics.  Outcome: Progressing   Problem: SCI BOWEL ELIMINATION Goal: RH STG MANAGE BOWEL WITH ASSISTANCE Description: STG Manage Bowel with mod I Assistance. Outcome: Progressing Goal: RH STG SCI MANAGE BOWEL WITH MEDICATION WITH ASSISTANCE Description: STG SCI Manage bowel with medication with mod I assistance. Outcome: Progressing   Problem: SCI BLADDER ELIMINATION Goal: RH STG MANAGE BLADDER WITH ASSISTANCE Description: STG Manage Bladder With toileting Assistance Outcome: Progressing   Problem: RH SAFETY Goal: RH STG ADHERE TO SAFETY PRECAUTIONS W/ASSISTANCE/DEVICE Description: STG Adhere to Safety Precautions With cues Assistance/Device. Outcome: Progressing   Problem: RH PAIN MANAGEMENT Goal: RH STG PAIN MANAGED AT OR BELOW PT'S PAIN GOAL Description: < 4 with prns  Outcome: Progressing   Problem: RH KNOWLEDGE DEFICIT SCI Goal: RH STG INCREASE KNOWLEDGE OF SELF CARE AFTER SCI Description: Patient will be able to direct care needs at discharge using educational resources independnently Outcome: Progressing

## 2024-06-23 NOTE — Progress Notes (Signed)
 Physical Therapy Session Note  Patient Details  Name: Adrian Ray MRN: [id number] Date of Birth: February 22, 1934  Today's Date: 06/23/2024 PT Individual Time: 0847-1000 PT Individual Time Calculation (min): 73 min   Short Term Goals: Week 1:  PT Short Term Goal 1 (Week 1): pt will perorm sit to stand with LRAD and CGA PT Short Term Goal 2 (Week 1): pt will perform bed to chair tansfer with LRAD and CGA PT Short Term Goal 3 (Week 1): Pt will ambulated 150 feet with LRAD and CGA  Skilled Therapeutic Interventions/Progress Updates: Patient semi-reclined in bed on entrance to room. Patient alert and agreeable to PT session.   Patient reported unrated pain posterior neck (pain medication provided per pt request).   Therapeutic Activity: Bed Mobility: Pt performed supine<sit on EOB with supervision and HOB elevated) PTA donned B TED hose dependently. Transfers: Pt performed sit<>stand transfers throughout session with supervision and one moment of posterior LOB that required minA to prevent with cues for pt to lean anteriorly and press front of foot to ground. Pt demonstrates better recall this session for hand placement.  Neuromuscular Re-ed: NMR facilitated during session with focus on dynamic standing balance, coordination. - Pt tossed bean bags to cornhole board 4 rounds with rest breaks. Pt initially with L HHA and light minA on 1st round, then progressed to light CGA/close supervision with no UE support. Pt improved coordination and dynamic standing balance as rounds progressed.   NMR performed for improvements in motor control and coordination, balance, sequencing, judgement, and self confidence/ efficacy in performing all aspects of mobility at highest level of independence.   Therapeutic Exercise: Pt performed the following exercises with therapist providing the described cuing and facilitation for improvement. - Pt ambulated roughly 300' x 2 in RW with supervision in order to increase  cardiovascular flow  Patient sitting in WC at end of session with brakes locked, belt alarm set, and all needs within reach.      Therapy Documentation Precautions:  Precautions Precautions: Fall, Cervical Recall of Precautions/Restrictions: Impaired Restrictions Weight Bearing Restrictions Per Provider Order: No Other Position/Activity Restrictions: cervical; no pushing/pulling lifting more than 5-10 lbs  Therapy/Group: Individual Therapy  Ellen Mayol PTA 06/23/2024, 12:21 PM

## 2024-06-23 NOTE — Discharge Summary (Signed)
 Physician Discharge Summary  Patient ID: Adrian Ray MRN: [id number] DOB/AGE: [birth year] 88 y.o.  Admit date: 06/17/2024 Discharge date: 06/26/2024  Discharge Diagnoses:  Principal Problem:   Odontoid fracture [facility]) Active Problems:   Osteoarthritis of knee, unspecified   Pain in shoulder   Sensorineural hearing loss, bilateral   Hyponatremia  Neck pain   Discharged Condition: stable  Significant Diagnostic Studies:   Labs:  Basic Metabolic Panel:    Latest Ref Rng & Units 06/26/2024    4:42 AM 06/22/2024    4:33 AM 06/19/2024    4:55 AM  BMP  Glucose 70 - 99 mg/dL 897  896  94   BUN 8 - 23 mg/dL 8  8  10    Creatinine 0.61 - 1.24 mg/dL 9.22  9.22  9.28   Sodium 135 - 145 mmol/L 129  129  129   Potassium 3.5 - 5.1 mmol/L 4.0  4.2  4.1   Chloride 98 - 111 mmol/L 97  95  95   CO2 22 - 32 mmol/L 25  27  24    Calcium 8.9 - 10.3 mg/dL 8.8  8.6  8.6      CBC:    Latest Ref Rng & Units 06/26/2024    4:42 AM 06/22/2024    4:33 AM 06/19/2024    4:55 AM  CBC  WBC 4.0 - 10.5 K/uL 7.5  6.1  9.2   Hemoglobin 13.0 - 17.0 g/dL 85.7  86.4  86.1   Hematocrit 39.0 - 52.0 % 41.3  39.3  39.3   Platelets 150 - 400 K/uL 373  331  261      CBG: No results for input(s): GLUCAP in the last 168 hours.  Brief HPI:   Adrian Ray is a 89 y.o. male with history of COPD, bronchiectasis, BPH, B12 deficiency, left clavicle fracture 6 weeks ago, Alzheimer's dementia who was admitted from his ILF after unwitnessed fall onto his face with reports of neck pain.  He had multiple abrasions BUE and BLE as well as on face.  He reported that he was walking when he felt weak, bent down and fell over onto his face.  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  He was taken to OR emergently for C1-C2 segmental instrumentation and fusion with reduction of C2 fracture by Dr. Leeanna.  Postop evaluation was done and patient was noted to  be requiring min assist for mobility with BUE support.  Had balance deficits as well as decreased ability to follow simple commands due to his hearing loss.  He was limited by neck pain and weakness.  He was independent prior to admission and family and therapy recommended CIR due to functional decline   Hospital Course: Adrian Ray was admitted to rehab 06/17/2024 for inpatient therapies to consist of PT and OT at least three hours five days a week. Past admission physiatrist, therapy team and rehab RN have worked together to provide customized collaborative inpatient rehab.  His blood pressures were monitored on TID basis and has been relatively stable.  Serial check of electrolytes shows acute on chronic hyponatremia to be stable at 129 and he has been asymptomatic.  Recommend continued monitoring twice a week for now.  P.o. intake has been reasonable and nutritional supplements have been offered on twice daily basis.  He has had issues with constipation and bowel program initiated however patient has refused laxatives frequently.  Follow-up  CBC shows leukocytosis to have resolved and H&H is stable.  Pain is controlled with scheduled Tylenol  4 times daily and he has been using oxycodone  on rare occasions in the evenings.   Rehab course: During patient's stay in rehab weekly team conferences were held to monitor patient's progress, set goals and discuss barriers to discharge. At admission, patient required mod assist with mobility and with basic ADL tasks. He  has had improvement in activity tolerance, balance, postural control as well as ability to compensate for deficits.  He requires supervision with ADL tasks and min assist to don TED hose and shoes.  He requires supervision for transfers and is able to ambulate 300' with use of rolling walker and cues for safety.  He requires min assist with tasks due to tendency for posterior loss of balance and delayed reflexes.    Discharge disposition: 03-Skilled  Nursing Facility  Diet: Reglar  Special Instructions: 1.  No pushing, pulling, hyperextending your neck or lifting items over 5 pounds. 2.  Recommend recheck  BMET twice a week to monitor sodium levels. 3. Offer nutritional supplements between meals.    Allergies as of 06/26/2024       Reactions   Tetracycline Swelling   Lip swelling   Codeine Other (See Comments)   Unknown reaction   Shellfish Allergy  Hives, Nausea And Vomiting        Medication List     STOP taking these medications    donepezil 5 MG tablet Commonly known as: ARICEPT   rosuvastatin 5 MG tablet Commonly known as: CRESTOR       TAKE these medications    B-12 PO Take 1 tablet by mouth daily.   cyclobenzaprine  5 MG tablet Commonly known as: FLEXERIL  Take 1 tablet (5 mg total) by mouth 3 (three) times daily as needed for muscle spasms.   docusate sodium  100 MG capsule Commonly known as: COLACE Take 2 capsules (200 mg total) by mouth daily.   finasteride  5 MG tablet Commonly known as: PROSCAR  Take 5 mg by mouth daily.   fluticasone  50 MCG/ACT nasal spray Commonly known as: FLONASE  Place 2 sprays into both nostrils daily.   fluticasone  furoate-vilanterol 100-25 MCG/ACT Aepb Commonly known as: Breo Ellipta  Inhale 1 puff into the lungs daily.   melatonin 5 MG Tabs Take 1 tablet (5 mg total) by mouth at bedtime as needed.   neomycin -bacitracin -polymyxin Oint Commonly known as: NEOSPORIN Apply 1 Application topically 2 (two) times daily.   oxyCODONE  5 MG immediate release tablet--Rx# 5 pills Commonly known as: Oxy IR/ROXICODONE  Take 0.5-1 tablets (2.5-5 mg total) by mouth 2 (two) times daily as needed for severe pain (pain score 7-10). What changed: when to take this        Contact information for follow-up providers     Kirsteins, Prentice BRAVO, MD. Call.   Specialty: Physical Medicine and Rehabilitation Why: As needed Contact information: [address] [place]  [address] [phone]         Ransom Other, MD Follow up.   Specialty: Internal Medicine Why: Call in 1-2 days for post hospital follow up Contact information: [address] [place] [address] [phone]         Darnella Dorn SAUNDERS, MD Follow up.   Specialty: Neurosurgery Why: Call in 1-2 days for post hospital follow up Contact information: [address] [place] [address] [phone]  Contact information for after-discharge care     Destination     [facility] .   Service: Skilled Nursing Contact information: [address] [place]  [address] [phone]                     Signed: Sharlet GORMAN Schmitz 06/26/2024, 9:53 AM

## 2024-06-23 NOTE — Progress Notes (Signed)
 Speech Language Pathology Daily Session Note  Patient Details  Name: Adrian Ray MRN: [id number] Date of Birth: Oct 14, 1933  Today's Date: 06/23/2024 SLP Individual Time: 1349-1449 SLP Individual Time Calculation (min): 60 min  Short Term Goals: Week 1: SLP Short Term Goal 1 (Week 1): Patient will demonstrate problem solving skills during functional situations given mod multimodal A SLP Short Term Goal 2 (Week 1): Patient will recall daily information given mod multimodal A SLP Short Term Goal 3 (Week 1): Patient will demonstrate auditory comprehension during participation in complex conversation given mod multimodal A  Skilled Therapeutic Interventions: Skilled therapy session focused on cognitive goals. Upon entrance, patient requested transfer to BR via RW. Patient ambulated to BR with RW and continent of bladder. Patient completed hand hygiene and oral care at the sink. Patient independently oriented to time this session! Patient transferred to SLP office via [facility] to complete medication management task. Patient required minA to complete BID pill box according to written and verbalized directions with NO mistakes. Patient left in bed with alarm set and call bell in reach. Continue POC     Pain None reported   Therapy/Group: Individual Therapy  Trequan Marsolek M.A., CCC-SLP 06/23/2024, 7:42 AM

## 2024-06-23 NOTE — Progress Notes (Signed)
 Occupational Therapy Session Note  Patient Details  Name: Adrian Ray MRN: [id number] Date of Birth: 03-25-1934  Today's Date: 06/23/2024 OT Individual Time: 1020-1115 OT Individual Time Calculation (min): 55 min    Short Term Goals: Week 1:  OT Short Term Goal 1 (Week 1): PT will be able to perform sit to stands with min cues for proper hand placement and with contact guard during ADL tasks OT Short Term Goal 2 (Week 1): Pt will be able to thread LB (underwear and pants) with setup OT Short Term Goal 3 (Week 1): Pt will be able to button pants in standing using both hands with contact guard (decr posterior lean)  Skilled Therapeutic Interventions/Progress Updates:    Pt received in w/c very eager to shave.   Pt placed at sink but due to positioning of mirror at a high level, pt had to stand to see his face. Pt stood for over 15 minutes to work on shaving (min A to do area around his chin) and brush his teeth. He then ambulated to shower with RW CGA and then S to step into shower.  Pt bathed himself using long sponge for his feet and A with rinsing off.   He transferred to [facility] to dress.  His PCA,Lisa, arrived at this time to see how he was doing with dressing.  Discussed encouraging him to try himself.  Pt able to don clothing with S but continues to need cues to push up from wc arm rests vs walker. Showed [name] a trick to donning TED hose with a plastic bag. Pt able to get his shoes on but needed min A to fix strap of one sandal.   Pt wanted to go outside so Brady found warm blankets and robe to cover pt. Pt with his PCA and RN aware.   Therapy Documentation Precautions:  Precautions Precautions: Fall, Cervical Recall of Precautions/Restrictions: Impaired Restrictions Weight Bearing Restrictions Per Provider Order: No Other Position/Activity Restrictions: cervical; no pushing/pulling lifting more than 5-10 lbs   Pain: Pain Assessment Pain Scale: 0-10 Pain Score: 9  Pain Location:  Back Pain Intervention(s): Medication (See eMAR) ADL: ADL Eating: Set up Where Assessed-Eating: Bed level Grooming: Setup Where Assessed-Grooming: Standing at sink Upper Body Bathing: Supervision/safety Where Assessed-Upper Body Bathing: Shower Lower Body Bathing: Supervision/safety (used long sponge) Where Assessed-Lower Body Bathing: Shower Upper Body Dressing: Supervision/safety Where Assessed-Upper Body Dressing: Edge of bed Lower Body Dressing:  (Supervision underwear and pants, A for TED hose and min for shoes) Where Assessed-Lower Body Dressing: Edge of bed Toileting: Supervision/safety Where Assessed-Toileting: Teacher, Adult Education: Furniture Conservator/restorer Method: Proofreader: Acupuncturist: Close supervision Film/video Editor Method: Designer, Industrial/product: Shower seat without back, Grab bars   Therapy/Group: Individual Therapy  [name] 06/23/2024, 12:39 PM

## 2024-06-23 NOTE — Progress Notes (Signed)
 PROGRESS NOTE   Subjective/Complaints:  Still reports pain in the back of his neck. Is able to sleep but feels that he never really sleeps deeply. Asked about area on his lower lip which is still raised.   ROS: Patient denies fever, rash, sore throat, blurred vision, dizziness, nausea, vomiting, diarrhea, cough, shortness of breath or chest pain,   headache, or mood change.   Objective:   No results found.  Recent Labs    06/22/24 0433  WBC 6.1  HGB 13.5  HCT 39.3  PLT 331   Recent Labs    06/22/24 0433  NA 129*  K 4.2  CL 95*  CO2 27  GLUCOSE 103*  BUN 8  CREATININE 0.77  CALCIUM 8.6*        Intake/Output Summary (Last 24 hours) at 06/23/2024 1056 Last data filed at 06/23/2024 0855 Gross per 24 hour  Intake 836 ml  Output 475 ml  Net 361 ml        Physical Exam: Vital Signs Blood pressure 131/68, pulse 61, temperature 97.9 F (36.6 C), resp. rate 18, height 6' 2.5 (1.892 m), weight 85.6 kg, SpO2 98%.      Constitutional: No distress . Vital signs reviewed. HEENT: NCAT, EOMI, oral membranes moist, scarring still on mouth, lower lip Neck: supple Cardiovascular: RRR without murmur. No JVD    Respiratory/Chest: CTA Bilaterally without wheezes or rales. Normal effort    GI/Abdomen: BS +, non-tender, non-distended Ext: no clubbing, cyanosis, or edema Psych: pleasant and cooperative  Skin: No evidence of breakdown, no evidence of rash, cervical incision CDI with scab.  bilateral knee abrasions multiple some epithelialized some not, no lacerations Neuro: alert, remains very HOH even with hearing aid. Likely some cognitive delay also. CN exam non-focal. : motor strength is 4/5 in bilateral deltoid, bicep, tricep, grip, hip flexor, knee extensors, ankle dorsiflexor and plantar flexor Sensory exam normal sensation to light touch in bilateral upper and lower extremities Cerebellar exam normal finger to  nose to finger  Musculoskeletal: Full range of motion in all 4 extremities. No joint swelling  Assessment/Plan: 1. Functional deficits which require 3+ hours per day of interdisciplinary therapy in a comprehensive inpatient rehab setting. Physiatrist is providing close team supervision and 24 hour management of active medical problems listed below. Physiatrist and rehab team continue to assess barriers to discharge/monitor patient progress toward functional and medical goals  Care Tool:  Bathing    Body parts bathed by patient: Right arm, Left arm, Chest, Abdomen, Front perineal area, Buttocks, Right upper leg, Left upper leg, Face   Body parts bathed by helper: Right lower leg, Left lower leg     Bathing assist Assist Level: Minimal Assistance - Patient > 75%     Upper Body Dressing/Undressing Upper body dressing   What is the patient wearing?: Pull over shirt    Upper body assist Assist Level: Supervision/Verbal cueing    Lower Body Dressing/Undressing Lower body dressing      What is the patient wearing?: Underwear/pull up, Pants     Lower body assist Assist for lower body dressing: Supervision/Verbal cueing     Toileting Toileting    Toileting assist  Assist for toileting: Supervision/Verbal cueing     Transfers Chair/bed transfer  Transfers assist     Chair/bed transfer assist level: Contact Guard/Touching assist     Locomotion Ambulation   Ambulation assist      Assist level: Minimal Assistance - Patient > 75% Assistive device: Walker-rolling Max distance: 90   Walk 10 feet activity   Assist     Assist level: Minimal Assistance - Patient > 75% Assistive device: Walker-rolling   Walk 50 feet activity   Assist    Assist level: Minimal Assistance - Patient > 75% Assistive device: Walker-rolling    Walk 150 feet activity   Assist Walk 150 feet activity did not occur: Safety/medical concerns (fatigue)         Walk 10 feet on  uneven surface  activity   Assist     Assist level: Minimal Assistance - Patient > 75% Assistive device: Walker-rolling   Wheelchair     Assist Is the patient using a wheelchair?: Yes Type of Wheelchair: Manual    Wheelchair assist level: Dependent - Patient 0%      Wheelchair 50 feet with 2 turns activity    Assist            Wheelchair 150 feet activity     Assist      Assist Level: Dependent - Patient 0%   Blood pressure 131/68, pulse 61, temperature 97.9 F (36.6 C), resp. rate 18, height 6' 2.5 (1.892 m), weight 85.6 kg, SpO2 98%.  Medical Problem List and Plan: 1. Functional deficits secondary to odontoid fracture, NO COLLAR NEEDED per nsgy             -patient may  shower if incision is covered             -ELOS/Goals: 12/1 to [facility] Sup/minA   -Continue CIR therapies including PT, OT  Team conference today please see physician documentation under team conference tab, met with team  to discuss problems,progress, and goals. Formulized individual treatment plan based on medical history, underlying problem and comorbidities.  2.  Antithrombotics: -DVT/anticoagulation:  Mechanical: Sequential compression devices, below knee Bilateral lower extremities             -antiplatelet therapy: N/A  3. Pain Management: Oxycodone  prn. Tylenol  prn  11/27- last used oxy 11/25 4. Mood/Behavior/Sleep: LCSW to follow for evaluation and support             --Melatonin prn for sleep.              -antipsychotic agents: N/A  5. Neuropsych/cognition: This patient appears to be capable of making decisions on his own behalf. -11/24 very HOH. Will see what team thinks about SLP for him once they have time to work with him. He has some mild cog deficits related to his baseline alzheimer's .     6. Skin/Wound Care: routine pressure relief measures and skin care for wounds. Neosporin ordered.  -continue local care to abrasions.  -discussed his lip healing, needs  to let scarring/scab totally resolve. If persistent abnl, could consider outpt ENT assessment 7. Fluids/Electrolytes/Nutrition: Monitor I/O and routine labs, continue vitamins/supplements.   -Hyponatremia as below #11  8. Chronic rhinitis/allergies: Has chronic cough a/w this --continue Flonase  and Breo inhaler.    9. BPH: Managed with Proscar  5mg  daily  10. Alzheimer's dementia- Mild : Per family decline over  past couple of years. Tends to repeat himself             --  very HOH. Using dry erase board for communication needs.   Use hearing aides when possible   11. Acute on chronic hyponatremia: Baseline Na-132-->128             --Question SIADH v/s cerebral salt wasting.   -11/28 sodium has been steady at 129 on several checks 12. Constipation: 06/19/24 no BM since 11/21    -will give him a dose of sorbitol  today and add prn miralax   11/27- LBM   13. Leukocytosis: resolved  11/27- WBC 6.1k       LOS: 6 days A FACE TO FACE EVALUATION WAS PERFORMED  Arthea ONEIDA Gunther 06/23/2024, 10:56 AM

## 2024-06-23 NOTE — Plan of Care (Signed)
  Problem: SCI BOWEL ELIMINATION Goal: RH STG MANAGE BOWEL WITH ASSISTANCE Description: STG Manage Bowel with mod I Assistance. Outcome: Progressing   Problem: SCI BLADDER ELIMINATION Goal: RH STG MANAGE BLADDER WITH ASSISTANCE Description: STG Manage Bladder With toileting Assistance Outcome: Progressing   Problem: RH SAFETY Goal: RH STG ADHERE TO SAFETY PRECAUTIONS W/ASSISTANCE/DEVICE Description: STG Adhere to Safety Precautions With cues Assistance/Device. Outcome: Progressing   Problem: RH PAIN MANAGEMENT Goal: RH STG PAIN MANAGED AT OR BELOW PT'S PAIN GOAL Description: < 4 with prns  Outcome: Progressing   Problem: RH KNOWLEDGE DEFICIT SCI Goal: RH STG INCREASE KNOWLEDGE OF SELF CARE AFTER SCI Description: Patient will be able to direct care needs at discharge using educational resources independnently Outcome: Progressing

## 2024-06-24 NOTE — Progress Notes (Signed)
 Physical Therapy Session Note  Patient Details  Name: Adrian Ray MRN: [id number] Date of Birth: [birth year]  Today's Date: 06/24/2024 PT Individual Time: 0915-1000 PT Individual Time Calculation (min): 45 min   Short Term Goals: Week 1:  PT Short Term Goal 1 (Week 1): pt will perorm sit to stand with LRAD and CGA PT Short Term Goal 2 (Week 1): pt will perform bed to chair tansfer with LRAD and CGA PT Short Term Goal 3 (Week 1): Pt will ambulated 150 feet with LRAD and CGA  Skilled Therapeutic Interventions/Progress Updates:     Pt supine in bed with HOB elevated upon arrival. Pt reports 9/10 pain in his neck, premedicated and agreeable to therapy. Session emphasized functional strengthening, endurance, activity tolerance, and dynamic standing balance. Functional ambulation, ~195 ft, room <> main gym using RW with CGA quickly progressing to SBA/supervision. Pt required CGA/min A (from lower mat) for sit to stand transfers to RW throughout session. Pt required VC at times to push up from seat and to reach back for seat when performing sit to stand transfers. Pt performed x10 steps ups to 6 inch step on each LE using B HR with CGA. Pt then asc/desc 8-6 inch steps, followed by asc 4-6 inch, desc 8-3 inch steps and asc 8-3 inch steps and desc 4-6 inch steps - all using B HR with CGA Pt participated in standing ball toss to basketball hoop without UE support while standing in RW with CGA/SBA. At end of session, pt returned to supine, bed alarm set, and all needs within reach.  Therapy Documentation Precautions:  Precautions Precautions: Fall, Cervical Recall of Precautions/Restrictions: Impaired Restrictions Weight Bearing Restrictions Per Provider Order: No Other Position/Activity Restrictions: cervical; no pushing/pulling lifting more than 5-10 lbs  Therapy/Group: Individual Therapy  Comer CHRISTELLA Levora Comer Levora, PT, DPT 06/24/2024, 7:59 AM

## 2024-06-24 NOTE — Plan of Care (Signed)
  Problem: SCI BOWEL ELIMINATION Goal: RH STG MANAGE BOWEL WITH ASSISTANCE Description: STG Manage Bowel with mod I Assistance. Outcome: Progressing Goal: RH STG SCI MANAGE BOWEL WITH MEDICATION WITH ASSISTANCE Description: STG SCI Manage bowel with medication with mod I assistance. Outcome: Progressing   Problem: SCI BLADDER ELIMINATION Goal: RH STG MANAGE BLADDER WITH ASSISTANCE Description: STG Manage Bladder With toileting Assistance Outcome: Progressing   Problem: RH SAFETY Goal: RH STG ADHERE TO SAFETY PRECAUTIONS W/ASSISTANCE/DEVICE Description: STG Adhere to Safety Precautions With cues Assistance/Device. Outcome: Progressing   Problem: RH PAIN MANAGEMENT Goal: RH STG PAIN MANAGED AT OR BELOW PT'S PAIN GOAL Description: < 4 with prns  Outcome: Progressing

## 2024-06-24 NOTE — Plan of Care (Signed)
  Problem: Consults Goal: RH SPINAL CORD INJURY PATIENT EDUCATION Description:  See Patient Education module for education specifics.  Outcome: Progressing   Problem: SCI BOWEL ELIMINATION Goal: RH STG MANAGE BOWEL WITH ASSISTANCE Description: STG Manage Bowel with mod I Assistance. Outcome: Progressing Goal: RH STG SCI MANAGE BOWEL WITH MEDICATION WITH ASSISTANCE Description: STG SCI Manage bowel with medication with mod I assistance. Outcome: Progressing   Problem: SCI BLADDER ELIMINATION Goal: RH STG MANAGE BLADDER WITH ASSISTANCE Description: STG Manage Bladder With toileting Assistance Outcome: Progressing   Problem: RH SAFETY Goal: RH STG ADHERE TO SAFETY PRECAUTIONS W/ASSISTANCE/DEVICE Description: STG Adhere to Safety Precautions With cues Assistance/Device. Outcome: Progressing   Problem: RH PAIN MANAGEMENT Goal: RH STG PAIN MANAGED AT OR BELOW PT'S PAIN GOAL Description: < 4 with prns  Outcome: Progressing   Problem: RH KNOWLEDGE DEFICIT SCI Goal: RH STG INCREASE KNOWLEDGE OF SELF CARE AFTER SCI Description: Patient will be able to direct care needs at discharge using educational resources independnently Outcome: Progressing

## 2024-06-24 NOTE — Progress Notes (Signed)
 PROGRESS NOTE   Subjective/Complaints:  Pt doing well, slept well, pain doing ok. LBM 3 days ago per pt (documented 2 days ago) but states he doesn't want to take anything for it. Urinating fine. No other complaints or concerns.    ROS: as per HPI. Denies CP, SOB, abd pain, N/V/D, or any other complaints at this time.    Objective:   No results found.  Recent Labs    06/22/24 0433  WBC 6.1  HGB 13.5  HCT 39.3  PLT 331   Recent Labs    06/22/24 0433  NA 129*  K 4.2  CL 95*  CO2 27  GLUCOSE 103*  BUN 8  CREATININE 0.77  CALCIUM 8.6*        Intake/Output Summary (Last 24 hours) at 06/24/2024 1327 Last data filed at 06/24/2024 0800 Gross per 24 hour  Intake 240 ml  Output 755 ml  Net -515 ml        Physical Exam: Vital Signs Blood pressure (!) 141/80, pulse 76, temperature (!) 97.5 F (36.4 C), temperature source Oral, resp. rate 15, height 6' 2.5 (1.892 m), weight 86.4 kg, SpO2 98%.      Constitutional: No distress . Vital signs reviewed. HEENT: NCAT, EOMI, oral membranes moist, scarring still on mouth, lower lip Neck: supple Cardiovascular: RRR without murmur. No JVD    Respiratory/Chest: CTA Bilaterally without wheezes or rales. Normal effort    GI/Abdomen: BS +, non-tender, non-distended Ext: no clubbing, cyanosis, or edema Psych: pleasant and cooperative  Skin: No evidence of breakdown, no evidence of rash, cervical incision CDI with scab.  bilateral knee abrasions multiple some epithelialized some not, no lacerations Neuro: alert, remains very HOH even with hearing aid. Likely some cognitive delay also.   PRIOR EXAMS: Neuro:CN exam non-focal. : motor strength is 4/5 in bilateral deltoid, bicep, tricep, grip, hip flexor, knee extensors, ankle dorsiflexor and plantar flexor Sensory exam normal sensation to light touch in bilateral upper and lower extremities Cerebellar exam normal finger to  nose to finger  Musculoskeletal: Full range of motion in all 4 extremities. No joint swelling  Assessment/Plan: 1. Functional deficits which require 3+ hours per day of interdisciplinary therapy in a comprehensive inpatient rehab setting. Physiatrist is providing close team supervision and 24 hour management of active medical problems listed below. Physiatrist and rehab team continue to assess barriers to discharge/monitor patient progress toward functional and medical goals  Care Tool:  Bathing    Body parts bathed by patient: Right arm, Left arm, Chest, Abdomen, Front perineal area, Buttocks, Right upper leg, Left upper leg, Face, Right lower leg, Left lower leg   Body parts bathed by helper: Right lower leg, Left lower leg     Bathing assist Assist Level: Supervision/Verbal cueing     Upper Body Dressing/Undressing Upper body dressing   What is the patient wearing?: Pull over shirt    Upper body assist Assist Level: Supervision/Verbal cueing    Lower Body Dressing/Undressing Lower body dressing      What is the patient wearing?: Underwear/pull up, Pants     Lower body assist Assist for lower body dressing: Supervision/Verbal cueing  Toileting Toileting    Toileting assist Assist for toileting: Supervision/Verbal cueing     Transfers Chair/bed transfer  Transfers assist     Chair/bed transfer assist level: Supervision/Verbal cueing     Locomotion Ambulation   Ambulation assist      Assist level: Supervision/Verbal cueing Assistive device: Walker-rolling Max distance: 300   Walk 10 feet activity   Assist     Assist level: Supervision/Verbal cueing Assistive device: Walker-rolling   Walk 50 feet activity   Assist    Assist level: Supervision/Verbal cueing Assistive device: Walker-rolling    Walk 150 feet activity   Assist Walk 150 feet activity did not occur: Safety/medical concerns (fatigue)  Assist level: Supervision/Verbal  cueing Assistive device: Walker-rolling    Walk 10 feet on uneven surface  activity   Assist     Assist level: Supervision/Verbal cueing Assistive device: Walker-rolling   Wheelchair     Assist Is the patient using a wheelchair?: No Type of Wheelchair: Manual    Wheelchair assist level: Dependent - Patient 0%      Wheelchair 50 feet with 2 turns activity    Assist            Wheelchair 150 feet activity     Assist      Assist Level: Dependent - Patient 0%   Blood pressure (!) 141/80, pulse 76, temperature (!) 97.5 F (36.4 C), temperature source Oral, resp. rate 15, height 6' 2.5 (1.892 m), weight 86.4 kg, SpO2 98%.  Medical Problem List and Plan: 1. Functional deficits secondary to odontoid fracture, NO COLLAR NEEDED per nsgy             -patient may  shower if incision is covered             -ELOS/Goals: 12/1 to [facility]/minA   -Continue CIR therapies including PT, OT  Team conference today please see physician documentation under team conference tab, met with team  to discuss problems,progress, and goals. Formulized individual treatment plan based on medical history, underlying problem and comorbidities.  2.  Antithrombotics: -DVT/anticoagulation:  Mechanical: Sequential compression devices, below knee Bilateral lower extremities             -antiplatelet therapy: N/A  3. Pain Management: Oxycodone  prn. Tylenol  prn  11/27- last used oxy 11/25 4. Mood/Behavior/Sleep: LCSW to follow for evaluation and support             --Melatonin prn for sleep.              -antipsychotic agents: N/A  5. Neuropsych/cognition: This patient appears to be capable of making decisions on his own behalf. -11/24 very HOH. Will see what team thinks about SLP for him once they have time to work with him. He has some mild cog deficits related to his baseline alzheimer's .     6. Skin/Wound Care: routine pressure relief measures and skin care for wounds.  Neosporin ordered.  -continue local care to abrasions.  -discussed his lip healing, needs to let scarring/scab totally resolve. If persistent abnl, could consider outpt ENT assessment  7. Fluids/Electrolytes/Nutrition: Monitor I/O and routine labs, continue vitamins/supplements.   -Hyponatremia as below #11  8. Chronic rhinitis/allergies: Has chronic cough a/w this --continue Flonase  and Breo inhaler.    9. BPH: Managed with Proscar  5mg  daily  10. Alzheimer's dementia- Mild : Per family decline over  past couple of years. Tends to repeat himself             --  very HOH. Using dry erase board for communication needs.   Use hearing aides when possible   11. Acute on chronic hyponatremia: Baseline Na-132-->128             --Question SIADH v/s cerebral salt wasting.   -11/28 sodium has been steady at 129 on several checks  12. Constipation: 06/18/24 no BM since surgery, start colace 200mg  daily  06/19/24 no BM since 11/21    -will give him a dose of sorbitol  today and add prn miralax   -06/24/24 LBM 2 days ago per documentation, pt doesn't want anything, has PRNs which I told him about too 13. Leukocytosis: resolved  11/27- WBC 6.1k       LOS: 7 days A FACE TO FACE EVALUATION WAS PERFORMED  [address] 06/24/2024, 1:27 PM

## 2024-06-24 NOTE — Progress Notes (Signed)
 Physical Therapy Discharge Summary  Patient Details  Name: Adrian Ray MRN: [id number] Date of Birth: 1934-03-01  Date of Discharge from PT service:June 25, 2024  {CH[facility] IP REHAB PT TIME CALCULATION:[id number]}   Patient has met 9 of 9 long term goals due to improved activity tolerance, improved balance, improved postural control, increased strength, decreased pain, and ability to compensate for deficits.  Patient to discharge at an ambulatory level Supervision.   Patient's care partner unavailable to provide the necessary physical assistance at discharge; therefore, short term SNF recommended for further rehabilitation.   Reasons goals not met: n/a  Recommendation:  Patient will benefit from ongoing skilled PT services in skilled nursing facility setting to continue to advance safe functional mobility, address ongoing impairments in bed mobility, functional transfers, gait training, standing balance, stair training, and minimize fall risk.  Equipment: TBD by next level of care  Reasons for discharge: treatment goals met and discharge from hospital  Patient/family agrees with progress made and goals achieved: Yes  PT Discharge Precautions/Restrictions Precautions Precautions: Fall;Cervical Restrictions Weight Bearing Restrictions Per Provider Order: No Other Position/Activity Restrictions: cervical; no pushing/pulling lifting more than 5-10 lbs Pain Assessment Pain Scale: 0-10 (scheduled tylenol ) Pain Score: 0-No pain Pain Location: Other (Comment) Pain Intervention(s): Medication (See eMAR) Pain Interference Pain Interference Pain Effect on Sleep: 4. Almost constantly Pain Interference with Therapy Activities: 3. Frequently Pain Interference with Day-to-Day Activities: 4. Almost constantly  Vision/Perception  Vision - History Ability to See in Adequate Light: 0 Adequate  Cognition Overall Cognitive Status: Impaired/Different from baseline (baseline dementia and very  HOH) Arousal/Alertness: Awake/alert Orientation Level: Oriented to person;Oriented to place;Oriented to situation Focused Attention: Appears intact Sustained Attention: Appears intact Memory: Impaired Memory Impairment: Decreased recall of new information;Decreased short term memory Awareness: Impaired Awareness Impairment: Anticipatory impairment Problem Solving: Impaired Safety/Judgment: Appears intact  Sensation Sensation Light Touch: Appears Intact Hot/Cold: Appears Intact Proprioception: Appears Intact Coordination Gross Motor Movements are Fluid and Coordinated: Yes Motor  Motor Motor: Other (comment) Motor - Discharge Observations: generalized weakness but has improved since evaluation; acute pain in neck  Mobility Bed Mobility Bed Mobility: Supine to Sit;Sit to Supine;Rolling Left Rolling Right: Supervision/verbal cueing Rolling Left: Supervision/Verbal cueing Supine to Sit: Supervision/Verbal cueing Sit to Supine: Supervision/Verbal cueing Transfers Transfers: Sit to Stand;Stand to Sit;Stand Pivot Transfers Sit to Stand: Supervision/Verbal cueing Stand to Sit: Supervision/Verbal cueing Stand Pivot Transfers: Supervision/Verbal cueing Stand Pivot Transfer Details: Verbal cues for technique Transfer (Assistive device): Rolling walker Locomotion  Gait Ambulation: Yes Gait Assistance: Supervision/Verbal cueing Gait Distance (Feet): 300 Feet Assistive device: Rolling walker Gait Assistance Details: Verbal cues for gait pattern;Verbal cues for precautions/safety Gait Gait: Yes Gait Pattern: Impaired Gait Pattern: Decreased step length - right;Decreased step length - left;Step-to pattern;Decreased stride length;Trunk flexed Stairs / Additional Locomotion Stairs: Yes Stairs Assistance: Supervision/Verbal cueing Stair Management Technique: Two rails;Step to pattern;Forwards Number of Stairs: 16 Height of Stairs: 6 Ramp: Supervision/Verbal cueing Pick up small  object from the floor assist level: Total Assistance - Patient < 25% Wheelchair Mobility Wheelchair Mobility: No  Trunk/Postural Assessment  Cervical Assessment Cervical Assessment: Exceptions to [facility] (cervical precautions) Thoracic Assessment Thoracic Assessment: Within Functional Limits Lumbar Assessment Lumbar Assessment: Within Functional Limits Postural Control Postural Control: Deficits on evaluation Righting Reactions: delayed Protective Responses: delayed  Balance Balance Balance Assessed: Yes Static Sitting Balance Static Sitting - Balance Support: Feet supported;No upper extremity supported Static Sitting - Level of Assistance: 5: Stand by assistance Dynamic Sitting Balance Dynamic Sitting -  Balance Support: Feet supported Dynamic Sitting - Level of Assistance: 5: Stand by assistance Static Standing Balance Static Standing - Balance Support: Bilateral upper extremity supported Static Standing - Level of Assistance: 5: Stand by assistance Dynamic Standing Balance Dynamic Standing - Balance Support: During functional activity;Bilateral upper extremity supported Dynamic Standing - Level of Assistance: 5: Stand by assistance Extremity Assessment      RLE Assessment RLE Assessment: Exceptions to [facility] General Strength Comments: Grossly 4/5 LLE Assessment LLE Assessment: Exceptions to [facility] General Strength Comments: Grossly 4/5   Dominic Sandoval PTA   06/24/2024, 7:59 AM

## 2024-06-25 MED ORDER — SORBITOL 70 % SOLN
30.0000 mL | Freq: Once | Status: AC
  Administered 2024-06-25: 30 mL via ORAL
  Filled 2024-06-25: qty 30

## 2024-06-25 NOTE — Progress Notes (Signed)
 Occupational Therapy Session Note  Patient Details  Name: Adrian Ray MRN: [id number] Date of Birth: Feb 20, 1934  Today's Date: 06/25/2024 OT Individual Time: 9082-8984 OT Individual Time Calculation (min): 58 min    Short Term Goals: Week 1:  OT Short Term Goal 1 (Week 1): PT will be able to perform sit to stands with min cues for proper hand placement and with contact guard during ADL tasks OT Short Term Goal 2 (Week 1): Pt will be able to thread LB (underwear and pants) with setup OT Short Term Goal 3 (Week 1): Pt will be able to button pants in standing using both hands with contact guard (decr posterior lean)  Skilled Therapeutic Interventions/Progress Updates:      Therapy Documentation Precautions:  Precautions Precautions: Fall, Cervical Recall of Precautions/Restrictions: Impaired Restrictions Weight Bearing Restrictions Per Provider Order: No Other Position/Activity Restrictions: cervical; no pushing/pulling lifting more than 5-10 lbs General: Pt supine in bed upon OT arrival, agreeable to OT session. Pt declining bathing/dressing tasks this AM although wanting to floss and put on undergarments.   Pain:  7/10 pain reported in neck, activity, intermittent rest breaks, distractions provided for pain management, pt reports tolerable to proceed.   ADL: OT providing skilled intervention on ADL retraining in order to increase independence with tasks and increase activity tolerance. Pt completed the following tasks at the current level of assist: Bed mobility: SBA with HOB raised supine>EOB Grooming/oral hygiene: Set up supine in bed for flossing teeth LB dressing: Min A for threading LE into underwear  Footwear: TED hose and shoes donned at total A for time management Transfers: CGA with RW, fading to SBA for al transfers completed   Exercises: Pt completed 10 minutes of nu step bike in order to increase BUE/BLEstrength and endurance in preparation for increased independence  in ADLs such as functional mobility and transfers. No rest break, on hills mode resistance ranging between levels 3 and 8.  Other Treatments: OT educating pt on fx pt experienced d/t pt wondering about loss of ROM of neck. OT describing anatomy structures as well as surgery limiting ROM. OT educating pt on shoulder/scapular exercises in order to loosen trapezius muscle. OT educating about scpaulat elevation/depression/protraction/retraction as well as shoulder circles. Pt using teach back method for understanding    Pt supine in bed with bed alarm activated, 2 bed rails up, call light within reach and 4Ps assessed.   Therapy/Group: Individual Therapy  Camie Hoe, OTD, OTR/L 06/25/2024, 12:50 PM

## 2024-06-25 NOTE — Plan of Care (Signed)
  Problem: Consults Goal: RH SPINAL CORD INJURY PATIENT EDUCATION Description:  See Patient Education module for education specifics.  Outcome: Progressing   Problem: SCI BOWEL ELIMINATION Goal: RH STG MANAGE BOWEL WITH ASSISTANCE Description: STG Manage Bowel with mod I Assistance. Outcome: Progressing Goal: RH STG SCI MANAGE BOWEL WITH MEDICATION WITH ASSISTANCE Description: STG SCI Manage bowel with medication with mod I assistance. Outcome: Progressing   Problem: SCI BLADDER ELIMINATION Goal: RH STG MANAGE BLADDER WITH ASSISTANCE Description: STG Manage Bladder With toileting Assistance Outcome: Progressing   Problem: RH SAFETY Goal: RH STG ADHERE TO SAFETY PRECAUTIONS W/ASSISTANCE/DEVICE Description: STG Adhere to Safety Precautions With cues Assistance/Device. Outcome: Progressing   Problem: RH PAIN MANAGEMENT Goal: RH STG PAIN MANAGED AT OR BELOW PT'S PAIN GOAL Description: < 4 with prns  Outcome: Progressing   Problem: RH KNOWLEDGE DEFICIT SCI Goal: RH STG INCREASE KNOWLEDGE OF SELF CARE AFTER SCI Description: Patient will be able to direct care needs at discharge using educational resources independnently Outcome: Progressing

## 2024-06-25 NOTE — Progress Notes (Signed)
 Physical Therapy Session Note  Patient Details  Name: Adrian Ray MRN: [id number] Date of Birth: [birth year]  Today's Date: 06/25/2024 PT Individual Time: 1400-1444 PT Individual Time Calculation (min): 44 min   Short Term Goals: Week 1:  PT Short Term Goal 1 (Week 1): pt will perorm sit to stand with LRAD and CGA PT Short Term Goal 2 (Week 1): pt will perform bed to chair tansfer with LRAD and CGA PT Short Term Goal 3 (Week 1): Pt will ambulated 150 feet with LRAD and CGA  Skilled Therapeutic Interventions/Progress Updates:       Pt sleeping in bed on arrival - awakens to touch, not voice, given his hearing loss. Used white board to write to indicate it's time for his scheduled therapy treatment. Pt reports 9/10 neck pain, reports the pain rx doesn't help much. Mobility and distraction provided for pain support.   Supine<>sitting EOB with supervision with hospital bed features. Donned shoes with assist for time management. Also assisted him with donning B hearing aids to improve communication during session.   Sit<>stand to RW with supervision - pt requesting for PT to brace his RW as he relies on pulling from it to stand up. Ambulation completed at supervision level 150' and RW from his room to the ortho gym.   Completed car transfer with supervision, able to safely get legs in/out of vehicle without assist.   Navigated a 64ft ramp with supervision and RW - min cues for awareness of pace with descent but no LOB noted.   Reviewed stair training with both 6 and 3 steps while using 2 hand rails. Pt able to navigate x16 steps total at supervision level - primarily a step-to pattern with descent and and reciprocal pattern for ascent.   Remaining time spent on treatment focused on standing balance. -overhead reaching with ball -chest press reaching with ball -standing marching in place while holding ball *needed minA for tasks due to posterior LOB and delayed stepping strategies for  balance recovery. Pt relies heavily on UE support for his balance and safety.   Pt ambulated back to his room at supervision level while using the RW. Ended treatment in bed, needs met.   Therapy Documentation Precautions:  Precautions Precautions: Fall, Cervical Recall of Precautions/Restrictions: Impaired Restrictions Weight Bearing Restrictions Per Provider Order: No Other Position/Activity Restrictions: cervical; no pushing/pulling lifting more than 5-10 lbs General:    Therapy/Group: Individual Therapy  [name] 06/25/2024, 7:42 AM

## 2024-06-25 NOTE — Progress Notes (Signed)
 PROGRESS NOTE   Subjective/Complaints:  Adrian Ray doing well again, slept ok, pain doing ok. LBM 3 days ago, agreeable to take whatever helped the last time (sorbitol ). Urinating fine. No other complaints or concerns.    ROS: as per HPI. Denies CP, SOB, abd pain, N/V/D, or any other complaints at this time.    Objective:   No results found.  No results for input(s): WBC, HGB, HCT, PLT in the last 72 hours.  No results for input(s): NA, K, CL, CO2, GLUCOSE, BUN, CREATININE, CALCIUM in the last 72 hours.       Intake/Output Summary (Last 24 hours) at 06/25/2024 0954 Last data filed at 06/25/2024 0539 Gross per 24 hour  Intake 240 ml  Output 650 ml  Net -410 ml        Physical Exam: Vital Signs Blood pressure 137/75, pulse 69, temperature 97.9 F (36.6 C), resp. rate 18, height 6' 2.5 (1.892 m), weight 88 kg, SpO2 98%.      Constitutional: No distress . Vital signs reviewed. Laying in bed but got up to eat HEENT: NCAT, EOMI, oral membranes moist, scarring/abrasions still on lower lip but swelling better Neck: supple Cardiovascular: RRR without murmur. No JVD    Respiratory/Chest: CTA Bilaterally without wheezes or rales. Normal effort    GI/Abdomen: BS +, non-tender, non-distended Ext: no clubbing, cyanosis, or edema Psych: pleasant and cooperative  Skin: No evidence of breakdown, no evidence of rash, cervical incision CDI with scab.  bilateral knee abrasions multiple some epithelialized some not, no lacerations Neuro: alert, remains very HOH even with hearing aid. Likely some cognitive delay also.   PRIOR EXAMS: Neuro:CN exam non-focal. : motor strength is 4/5 in bilateral deltoid, bicep, tricep, grip, hip flexor, knee extensors, ankle dorsiflexor and plantar flexor Sensory exam normal sensation to light touch in bilateral upper and lower extremities Cerebellar exam normal finger to nose  to finger  Musculoskeletal: Full range of motion in all 4 extremities. No joint swelling  Assessment/Plan: 1. Functional deficits which require 3+ hours per day of interdisciplinary therapy in a comprehensive inpatient rehab setting. Physiatrist is providing close team supervision and 24 hour management of active medical problems listed below. Physiatrist and rehab team continue to assess barriers to discharge/monitor patient progress toward functional and medical goals  Care Tool:  Bathing    Body parts bathed by patient: Right arm, Left arm, Chest, Abdomen, Front perineal area, Buttocks, Right upper leg, Left upper leg, Face, Right lower leg, Left lower leg   Body parts bathed by helper: Right lower leg, Left lower leg     Bathing assist Assist Level: Supervision/Verbal cueing     Upper Body Dressing/Undressing Upper body dressing   What is the patient wearing?: Pull over shirt    Upper body assist Assist Level: Supervision/Verbal cueing    Lower Body Dressing/Undressing Lower body dressing      What is the patient wearing?: Underwear/pull up, Pants     Lower body assist Assist for lower body dressing: Supervision/Verbal cueing     Toileting Toileting    Toileting assist Assist for toileting: Supervision/Verbal cueing     Transfers Chair/bed transfer  Transfers assist  Chair/bed transfer assist level: Supervision/Verbal cueing     Locomotion Ambulation   Ambulation assist      Assist level: Supervision/Verbal cueing Assistive device: Walker-rolling Max distance: 300   Walk 10 feet activity   Assist     Assist level: Supervision/Verbal cueing Assistive device: Walker-rolling   Walk 50 feet activity   Assist    Assist level: Supervision/Verbal cueing Assistive device: Walker-rolling    Walk 150 feet activity   Assist Walk 150 feet activity did not occur: Safety/medical concerns (fatigue)  Assist level: Supervision/Verbal  cueing Assistive device: Walker-rolling    Walk 10 feet on uneven surface  activity   Assist     Assist level: Supervision/Verbal cueing Assistive device: Walker-rolling   Wheelchair     Assist Is the patient using a wheelchair?: No Type of Wheelchair: Manual    Wheelchair assist level: Dependent - Patient 0%      Wheelchair 50 feet with 2 turns activity    Assist            Wheelchair 150 feet activity     Assist      Assist Level: Dependent - Patient 0%   Blood pressure 137/75, pulse 69, temperature 97.9 F (36.6 C), resp. rate 18, height 6' 2.5 (1.892 m), weight 88 kg, SpO2 98%.  Medical Problem List and Plan: 1. Functional deficits secondary to odontoid fracture, NO COLLAR NEEDED per nsgy             -patient may  shower if incision is covered             -ELOS/Goals: 12/1 to [facility]   -Continue CIR therapies including Adrian Ray, OT  Team conference today please see physician documentation under team conference tab, met with team  to discuss problems,progress, and goals. Formulized individual treatment plan based on medical history, underlying problem and comorbidities.  2.  Antithrombotics: -DVT/anticoagulation:  Mechanical: Sequential compression devices, below knee Bilateral lower extremities             -antiplatelet therapy: N/A  3. Pain Management: Oxycodone  prn. Tylenol  prn  11/27- last used oxy 11/25  4. Mood/Behavior/Sleep: LCSW to follow for evaluation and support             --Melatonin prn for sleep.              -antipsychotic agents: N/A  5. Neuropsych/cognition: This patient appears to be capable of making decisions on his own behalf. -11/24 very HOH. Will see what team thinks about SLP for him once they have time to work with him. He has some mild cog deficits related to his baseline alzheimer's .     6. Skin/Wound Care: routine pressure relief measures and skin care for wounds. Neosporin ordered.  -continue local  care to abrasions.  -discussed his lip healing, needs to let scarring/scab totally resolve. If persistent abnl, could consider outpt ENT assessment  7. Fluids/Electrolytes/Nutrition: Monitor I/O and routine labs, continue vitamins/supplements.   -Hyponatremia as below #11  8. Chronic rhinitis/allergies: Has chronic cough a/w this --continue Flonase  and Breo inhaler.    9. BPH: Managed with Proscar  5mg  daily  10. Alzheimer's dementia- Mild : Per family decline over  past couple of years. Tends to repeat himself             --very HOH. Using dry erase board for communication needs.   -Use hearing aides when possible   11. Acute on chronic hyponatremia: Baseline Na-132-->128             --  Question SIADH v/s cerebral salt wasting.   -11/28 sodium has been steady at 129 on several checks  12. Constipation: 06/18/24 no BM since surgery, start colace 200mg  daily  06/19/24 no BM since 11/21    -will give him a dose of sorbitol  today and add prn miralax  -06/24/24 LBM 2 days ago per documentation, Adrian Ray doesn't want anything, has PRNs which I told him about too -06/25/24 LBM 3 days, will give sorbitol  30ml once per request  13. Leukocytosis: resolved  11/27- WBC 6.1k       LOS: 8 days A FACE TO FACE EVALUATION WAS PERFORMED  [address] 06/25/2024, 9:54 AM

## 2024-06-26 ENCOUNTER — Non-Acute Institutional Stay (SKILLED_NURSING_FACILITY): Payer: Self-pay

## 2024-06-26 DIAGNOSIS — G301 Alzheimer's disease with late onset: Secondary | ICD-10-CM | POA: Diagnosis not present

## 2024-06-26 DIAGNOSIS — S022XXS Fracture of nasal bones, sequela: Secondary | ICD-10-CM

## 2024-06-26 DIAGNOSIS — J984 Other disorders of lung: Secondary | ICD-10-CM

## 2024-06-26 DIAGNOSIS — F02B0 Dementia in other diseases classified elsewhere, moderate, without behavioral disturbance, psychotic disturbance, mood disturbance, and anxiety: Secondary | ICD-10-CM

## 2024-06-26 DIAGNOSIS — E871 Hypo-osmolality and hyponatremia: Secondary | ICD-10-CM | POA: Diagnosis not present

## 2024-06-26 DIAGNOSIS — N401 Enlarged prostate with lower urinary tract symptoms: Secondary | ICD-10-CM | POA: Diagnosis not present

## 2024-06-26 DIAGNOSIS — S12120S Other displaced dens fracture, sequela: Secondary | ICD-10-CM

## 2024-06-26 LAB — BASIC METABOLIC PANEL WITH GFR
Anion gap: 7 (ref 5–15)
BUN: 8 mg/dL (ref 8–23)
CO2: 25 mmol/L (ref 22–32)
Calcium: 8.8 mg/dL — ABNORMAL LOW (ref 8.9–10.3)
Chloride: 97 mmol/L — ABNORMAL LOW (ref 98–111)
Creatinine, Ser: 0.77 mg/dL (ref 0.61–1.24)
GFR, Estimated: >60 mL/min (ref >60)
Glucose, Bld: 102 mg/dL — ABNORMAL HIGH (ref 70–99)
Potassium: 4 mmol/L (ref 3.5–5.1)
Sodium: 129 mmol/L — ABNORMAL LOW (ref 135–145)

## 2024-06-26 LAB — CBC
HCT: 41.3 % (ref 39.0–52.0)
Hemoglobin: 14.2 g/dL (ref 13.0–17.0)
MCH: 31.8 pg (ref 26.0–34.0)
MCHC: 34.4 g/dL (ref 30.0–36.0)
MCV: 92.4 fL (ref 80.0–100.0)
Platelets: 373 K/uL (ref 150–400)
RBC: 4.47 MIL/uL (ref 4.22–5.81)
RDW: 12.5 % (ref 11.5–15.5)
WBC: 7.5 K/uL (ref 4.0–10.5)
nRBC: 0 % (ref 0.0–0.2)

## 2024-06-26 MED ORDER — OXYCODONE HCL 5 MG PO TABS: 2.5000 mg | ORAL_TABLET | Freq: Two times a day (BID) | ORAL | 0 refills | Status: DC | PRN

## 2024-06-26 MED ORDER — OXYCODONE HCL 5 MG PO TABS: 2.5000 mg | ORAL_TABLET | Freq: Four times a day (QID) | ORAL | 0 refills | Status: DC | PRN

## 2024-06-26 MED ORDER — ACETAMINOPHEN 500 MG PO TABS: 1000.0000 mg | ORAL_TABLET | Freq: Three times a day (TID) | ORAL | Status: AC

## 2024-06-26 NOTE — Progress Notes (Addendum)
 Inpatient Rehabilitation Care Coordinator Discharge Note   Patient Details  Name: Adrian Ray MRN: [id number] Date of Birth: 11/13/1933   Discharge location: DISCHARGE TO [facility] FOR CONTINED REHAB  Length of Stay: 9 DAYS  Discharge activity level: CGA LEVEL  Home/community participation: ACTIVE  Patient response un:Yzjouy Literacy - How often do you need to have someone help you when you read instructions, pamphlets, or other written material from your doctor or pharmacy?: Rarely  Patient response un:Dnrpjo Isolation - How often do you feel lonely or isolated from those around you?: Rarely  Services provided included: MD, RD, PT, OT, SLP, RN, CM, TR, Pharmacy, SW  Financial Services:  Financial Services Utilized: Medicare    Choices offered to/list presented to: PT AND DAUGHTER  Follow-up services arranged:  Other (Comment) (SNF [facility])     [facility] transported to facility drive and Adrian Ray-CNA. Spoke with Edsel RN calling report too and aware DC summary was not completed when left and sent at 10:08 Am re-sent at 10:51.      Patient response to transportation need: Is the patient able to respond to transportation needs?: Yes In the past 12 months, has lack of transportation kept you from medical appointments or from getting medications?: No In the past 12 months, has lack of transportation kept you from meetings, work, or from getting things needed for daily living?: No   Patient/Family verbalized understanding of follow-up arrangements:  Yes  Individual responsible for coordination of the follow-up plan: PT AND Adrian Ray-DAUGHTER [phone]  Confirmed correct DME delivered: Adrian Ray 06/26/2024    Comments (or additional information): GOING TO [facility] OF [facility] TO CONTINUE HIS REHAB.   Summary of Stay    Date/Time Discharge Planning CSW  06/21/24 9157 Going to [facility] and then transtion to ALF. Daughter's want him to get the  maximum rehab here prior to going back to [facility]       Adrian Ray, Adrian Ray

## 2024-06-26 NOTE — Progress Notes (Addendum)
 Patient ID: Adrian Ray, male   DOB: 1934-06-06, 89 y.o.   MRN: [id number]  Left message for Cierra-SW admissions for [facility] regarding confirmation of bed today. Also spoke with erika-daughter will update all once have confirmation from [facility]  8;54 AM Spoke with [facility] they confirmed to take him today. They can come and transport him and CNA to come with driver. Will call daughter to confirm. Have spoken with Geni good with plan. Gave number to call report for bedside RN to call. Companion to come at 10;00 to get pt's belongings and take to facility.

## 2024-06-26 NOTE — Plan of Care (Signed)

## 2024-06-26 NOTE — Progress Notes (Addendum)
 PROGRESS NOTE   Subjective/Complaints:  Labs reviewed , no new cos   ROS: as per HPI. Denies CP, SOB, abd pain, N/V/D, or any other complaints at this time.    Objective:   No results found.  Recent Labs    06/26/24 0442  WBC 7.5  HGB 14.2  HCT 41.3  PLT 373    Recent Labs    06/26/24 0442  NA 129*  K 4.0  CL 97*  CO2 25  GLUCOSE 102*  BUN 8  CREATININE 0.77  CALCIUM 8.8*         Intake/Output Summary (Last 24 hours) at 06/26/2024 0926 Last data filed at 06/26/2024 0533 Gross per 24 hour  Intake 120 ml  Output 800 ml  Net -680 ml        Physical Exam: Vital Signs Blood pressure 136/69, pulse 81, temperature 97.9 F (36.6 C), resp. rate 18, height 6' 2.5 (1.892 m), weight 88 kg, SpO2 100%.    General: No acute distress Mood and affect are appropriate Heart: Regular rate and rhythm no rubs murmurs or extra sounds Lungs: Clear to auscultation, breathing unlabored, no rales or wheezes Abdomen: Positive bowel sounds, soft nontender to palpation, nondistended Extremities: No clubbing, cyanosis, or edema  Skin- facial laceraton well healed, post  cervical incision CDI   PRIOR EXAMS: Neuro:CN exam non-focal. : motor strength is 4/5 in bilateral deltoid, bicep, tricep, grip, hip flexor, knee extensors, ankle dorsiflexor and plantar flexor  Musculoskeletal: Full range of motion in all 4 extremities. No joint swelling  Assessment/Plan: 1. Functional deficits due to odontoid fracture  Stable for D/C today F/u PCP at [facility] F/u Spine surgery , Dr Darnella 1- 2 weeks No PMR f/u needed See D/C summary See D/C instructions  Care Tool:  Bathing    Body parts bathed by patient: Right arm, Left arm, Chest, Abdomen, Front perineal area, Buttocks, Right upper leg, Left upper leg, Face, Right lower leg, Left lower leg   Body parts bathed by helper: Right lower leg, Left lower leg     Bathing  assist Assist Level: Supervision/Verbal cueing     Upper Body Dressing/Undressing Upper body dressing   What is the patient wearing?: Pull over shirt    Upper body assist Assist Level: Supervision/Verbal cueing    Lower Body Dressing/Undressing Lower body dressing      What is the patient wearing?: Underwear/pull up, Pants     Lower body assist Assist for lower body dressing: Supervision/Verbal cueing     Toileting Toileting    Toileting assist Assist for toileting: Supervision/Verbal cueing     Transfers Chair/bed transfer  Transfers assist     Chair/bed transfer assist level: Supervision/Verbal cueing     Locomotion Ambulation   Ambulation assist      Assist level: Supervision/Verbal cueing Assistive device: Walker-rolling Max distance: 300   Walk 10 feet activity   Assist     Assist level: Supervision/Verbal cueing Assistive device: Walker-rolling   Walk 50 feet activity   Assist    Assist level: Supervision/Verbal cueing Assistive device: Walker-rolling    Walk 150 feet activity   Assist Walk 150 feet activity did not occur:  Safety/medical concerns (fatigue)  Assist level: Supervision/Verbal cueing Assistive device: Walker-rolling    Walk 10 feet on uneven surface  activity   Assist     Assist level: Supervision/Verbal cueing Assistive device: Walker-rolling   Wheelchair     Assist Is the patient using a wheelchair?: No Type of Wheelchair: Manual    Wheelchair assist level: Dependent - Patient 0%      Wheelchair 50 feet with 2 turns activity    Assist            Wheelchair 150 feet activity     Assist      Assist Level: Dependent - Patient 0%   Blood pressure 136/69, pulse 81, temperature 97.9 F (36.6 C), resp. rate 18, height 6' 2.5 (1.892 m), weight 88 kg, SpO2 100%.  Medical Problem List and Plan: 1. Functional deficits secondary to odontoid fracture, NO COLLAR NEEDED per nsgy              -patient may  shower if incision is covered             -ELOS/Goals: 12/1 to [facility]  No PMR f/u needed   2.  Antithrombotics: -DVT/anticoagulation:  Mechanical: Sequential compression devices, below knee Bilateral lower extremities             -antiplatelet therapy: N/A  3. Pain Management: Oxycodone  prn. Tylenol  prn  11/27- last used oxy 11/25  4. Mood/Behavior/Sleep: LCSW to follow for evaluation and support             --Melatonin prn for sleep.              -antipsychotic agents: N/A  5. Neuropsych/cognition: This patient appears to be capable of making decisions on his own behalf. -11/24 very HOH. Will see what team thinks about SLP for him once they have time to work with him. He has some mild cog deficits related to his baseline alzheimer's .     6. Skin/Wound Care: routine pressure relief measures and skin care for wounds. Neosporin ordered.    7. Fluids/Electrolytes/Nutrition: Monitor I/O and routine labs, continue vitamins/supplements.   -Hyponatremia as below #11  8. Chronic rhinitis/allergies: Has chronic cough a/w this --continue Flonase  and Breo inhaler.    9. BPH: Managed with Proscar  5mg  daily  10. Alzheimer's dementia- Mild : Per family decline over  past couple of years. Tends to repeat himself             --very HOH. Using dry erase board for communication needs.   -Use hearing aides when possible   11. Acute on chronic hyponatremia: Baseline Na-132-->128             --Question SIADH v/s cerebral salt wasting.   -11/28 sodium has been steady at 129 on several checks    Latest Ref Rng & Units 06/26/2024    4:42 AM 06/22/2024    4:33 AM 06/19/2024    4:55 AM  BMP  Glucose 70 - 99 mg/dL 897  896  94   BUN 8 - 23 mg/dL 8  8  10    Creatinine 0.61 - 1.24 mg/dL 9.22  9.22  9.28   Sodium 135 - 145 mmol/L 129  129  129   Potassium 3.5 - 5.1 mmol/L 4.0  4.2  4.1   Chloride 98 - 111 mmol/L 97  95  95   CO2 22 - 32 mmol/L 25  27  24    Calcium  8.9 - 10.3 mg/dL 8.8  8.6  8.6     12. Constipation: 06/18/24 no BM since surgery, start colace 200mg  daily  06/19/24 no BM since 11/21    -will give him a dose of sorbitol  today and add prn miralax  -06/24/24 LBM 2 days ago per documentation, pt doesn't want anything, has PRNs which I told him about too -06/25/24 LBM 3 days, will give sorbitol  30ml once per request  13. Leukocytosis: resolved  11/27- WBC 6.1k       LOS: 9 days A FACE TO FACE EVALUATION WAS PERFORMED  Prentice FORBES Compton 06/26/2024, 9:26 AM

## 2024-06-26 NOTE — Progress Notes (Signed)
 Inpatient Rehabilitation Discharge Medication Review by a Pharmacist   A complete drug regimen review was completed for this patient to identify any potential clinically significant medication issues.   High Risk Drug Classes Is patient taking? Indication by Medication  Antipsychotic No    Anticoagulant No    Antibiotic No    Opioid Yes Oxycodone  - severe pain    Antiplatelet No    Hypoglycemics/insulin No    Vasoactive Medication No    Chemotherapy No    Other Yes Docusate - constipation Flexeril  - muscle spasms Proscar  - BPH Flonase  - allergies Breo - COPD Melatonin-insomnia Neosporin - skin care Vitamin B12 - supplement         Type of Medication Issue Identified Description of Issue Recommendation(s)  Drug Interaction(s) (clinically significant)        Duplicate Therapy        Allergy         No Medication Administration End Date        Incorrect Dose        Additional Drug Therapy Needed        Significant med changes from prior encounter (inform family/care partners about these prior to discharge). PTA donepezil and rosuvastatin  were discontinued. Communicate relevant medication changes to patient/family members at discharge from CIR.      Other            Clinically significant medication issues were identified that warrant physician communication and completion of prescribed/recommended actions by midnight of the next day:  No   Name of provider notified for urgent issues identified:    Provider Method of Notification:        Pharmacist comments:    Time spent performing this drug regimen review (minutes): 20   Adrian Ray, RPh Clinical Pharmacist 06/26/2024 7:31 AM
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
## 2024-06-26 NOTE — Progress Notes (Signed)
 Occupational Therapy Discharge Summary  Patient Details  Name: Adrian Ray MRN: [id number] Date of Birth: Jun 26, 1934  Date of Discharge from OT service:June 26, 2024  Patient has met 8 of 8 long term goals due to improved activity tolerance, improved balance, postural control, ability to compensate for deficits, and improved awareness.  Patient to discharge at overall Supervision level.  Patient's care partner unavailable to provide the necessary physical and cognitive assistance at discharge.    Reasons goals not met: n/a  Recommendation:  Patient will benefit from ongoing skilled OT services in skilled nursing facility setting to continue to advance functional skills in the area of BADL.  Equipment: Long handled sponge  Reasons for discharge: treatment goals met  Patient/family agrees with progress made and goals achieved: Yes  OT Discharge Precautions/Restrictions  Precautions Precautions: Fall;Cervical Precaution/Restrictions Comments: no collar needed Restrictions Other Position/Activity Restrictions: cervical; no pushing/pulling lifting more than 5-10 lbs      ADL ADL Eating: Set up Where Assessed-Eating: Bed level Grooming: Setup Where Assessed-Grooming: Standing at sink Upper Body Bathing: Supervision/safety Where Assessed-Upper Body Bathing: Shower Lower Body Bathing: Supervision/safety Where Assessed-Lower Body Bathing: Shower Upper Body Dressing: Supervision/safety Where Assessed-Upper Body Dressing: Edge of bed Lower Body Dressing:  (Supervision underwear and pants, A for TED hose and min for shoes) Where Assessed-Lower Body Dressing: Edge of bed Toileting: Supervision/safety Where Assessed-Toileting: Teacher, Adult Education: Close supervision Toilet Transfer Method: Proofreader: Acupuncturist: Close supervision Film/video Editor Method: Designer, Industrial/product: Information systems manager without back,  Grab bars Vision Baseline Vision/History: 1 Wears glasses Patient Visual Report: No change from baseline Vision Assessment?: No apparent visual deficits Perception  Perception: Within Functional Limits Praxis Praxis: WFL Cognition Cognition Overall Cognitive Status: Impaired/Different from baseline Memory Impairment: Decreased recall of new information;Decreased short term memory Safety/Judgment: Appears intact Brief Interview for Mental Status (BIMS) Repetition of Three Words (First Attempt): 3 Temporal Orientation: Year: Missed by 1 year Temporal Orientation: Month: Accurate within 5 days Temporal Orientation: Day: Correct Recall: Sock: Yes, no cue required Recall: Blue: Yes, after cueing (a color) Recall: Bed: Yes, no cue required BIMS Summary Score: 13 Sensation Sensation Light Touch: Appears Intact Hot/Cold: Appears Intact Proprioception: Appears Intact Stereognosis: Appears Intact Coordination Gross Motor Movements are Fluid and Coordinated: Yes Fine Motor Movements are Fluid and Coordinated: Yes Motor  Motor Motor - Discharge Observations: generalized weakness but has improved since evaluation; acute pain in neck     Trunk/Postural Assessment  Postural Control Trunk Control: posterior bias Righting Reactions: delayed Protective Responses: delayed  Balance Static Sitting Balance Static Sitting - Level of Assistance: 7: Independent Dynamic Sitting Balance Dynamic Sitting - Level of Assistance: 5: Stand by assistance Static Standing Balance Static Standing - Level of Assistance: 5: Stand by assistance Dynamic Standing Balance Dynamic Standing - Level of Assistance: 5: Stand by assistance Extremity/Trunk Assessment RUE Assessment RUE Assessment: Within Functional Limits LUE Assessment LUE Assessment: Within Functional Limits   Blayne Garlick 06/26/2024, 8:29 AM

## 2024-06-26 NOTE — Progress Notes (Signed)
 Report given to Danielle in [facility].

## 2024-06-27 ENCOUNTER — Other Ambulatory Visit: Payer: Self-pay | Admitting: Orthopedic Surgery

## 2024-06-28 NOTE — Plan of Care (Signed)
  Problem: RH Cognition - SLP Goal: RH LTG Patient will demonstrate orientation with cues Description:  LTG:  Patient will demonstrate orientation to person/place/time/situation with cues (SLP)   Outcome: Completed/Met   Problem: RH Comprehension Communication Goal: LTG Patient will comprehend basic/complex auditory (SLP) Description: LTG: Patient will comprehend basic/complex auditory information with cues (SLP). Outcome: Completed/Met   Problem: RH Problem Solving Goal: LTG Patient will demonstrate problem solving for (SLP) Description: LTG:  Patient will demonstrate problem solving for basic/complex daily situations with cues  (SLP) Outcome: Completed/Met   Problem: RH Memory Goal: LTG Patient will demonstrate ability for day to day (SLP) Description: LTG:   Patient will demonstrate ability for day to day recall/carryover during cognitive/linguistic activities with assist  (SLP) Outcome: Not Met (cont to require increased A)

## 2024-06-28 NOTE — Progress Notes (Signed)
 Speech Language Pathology Discharge Summary  Patient Details  Name: Adrian Ray MRN: [id number] Date of Birth: 05-Feb-1934  Date of Discharge from SLP service:June 27, 2024   Patient has met 3 of 4 long term goals.  Patient to discharge at overall Min;Mod level.  Reasons goals not met: cont to require increased A for memory   Clinical Impression/Discharge Summary:  Pt has made good gains and has met 3 of 4 LTG's this admission due to improved cognition and auditory comprehension. Pt is currently an overall min-modA for cognitive-communicative tasks with the exception of short term memory. Pt/family education complete and pt will discharge to [facility] with 24 hour supervision from friends/family/etc. Pt would benefit from f/u ST services to maximize cognition in order to maximize functional independence.   Care Partner:  Caregiver Able to Provide Assistance: Yes  Type of Caregiver Assistance: Physical;Cognitive  Recommendation:  Home Health SLP  Rationale for SLP Follow Up: Maximize cognitive function and independence   Equipment: n/a   Reasons for discharge: Discharged from hospital   Patient/Family Agrees with Progress Made and Goals Achieved: Yes    Hardeep Reetz M.A., CCC-SLP 06/28/2024, 12:18 PM

## 2024-06-29 ENCOUNTER — Telehealth: Payer: Self-pay | Admitting: Adult Health

## 2024-06-29 DIAGNOSIS — E871 Hypo-osmolality and hyponatremia: Secondary | ICD-10-CM | POA: Diagnosis not present

## 2024-06-29 LAB — COMPREHENSIVE METABOLIC PANEL WITH GFR
Calcium: 8.9 (ref 8.7–10.7)
eGFR: 86

## 2024-06-29 LAB — BASIC METABOLIC PANEL WITH GFR
BUN: 12 (ref 4–21)
CO2: 27 — AB (ref 13–22)
Chloride: 98 — AB (ref 99–108)
Creatinine: 0.8 (ref 0.6–1.3)
Potassium: 4.7 meq/L (ref 3.5–5.1)
Sodium: 131 — AB (ref 137–147)

## 2024-06-29 MED ORDER — SODIUM CHLORIDE 1 G PO TABS: 1.0000 g | ORAL_TABLET | Freq: Every day | ORAL | Status: DC

## 2024-06-29 NOTE — Telephone Encounter (Signed)
 NA returned at 131. Continue sodium chloride  1 gram daily.

## 2024-07-03 ENCOUNTER — Non-Acute Institutional Stay: Payer: Self-pay | Admitting: Internal Medicine

## 2024-07-03 DIAGNOSIS — F02B0 Dementia in other diseases classified elsewhere, moderate, without behavioral disturbance, psychotic disturbance, mood disturbance, and anxiety: Secondary | ICD-10-CM

## 2024-07-03 DIAGNOSIS — E871 Hypo-osmolality and hyponatremia: Secondary | ICD-10-CM

## 2024-07-03 DIAGNOSIS — G301 Alzheimer's disease with late onset: Secondary | ICD-10-CM | POA: Diagnosis not present

## 2024-07-03 DIAGNOSIS — S12120S Other displaced dens fracture, sequela: Secondary | ICD-10-CM
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
## 2024-07-04 ENCOUNTER — Encounter: Payer: Self-pay | Admitting: Internal Medicine

## 2024-07-05 DIAGNOSIS — E7800 Pure hypercholesterolemia, unspecified: Secondary | ICD-10-CM | POA: Diagnosis not present

## 2024-07-05 LAB — BASIC METABOLIC PANEL WITH GFR
BUN: 11 (ref 4–21)
CO2: 24 — AB (ref 13–22)
Chloride: 98 — AB (ref 99–108)
Creatinine: 0.7 (ref 0.6–1.3)
Glucose: 99
Potassium: 4.4 meq/L (ref 3.5–5.1)
Sodium: 133 — AB (ref 137–147)

## 2024-07-05 LAB — COMPREHENSIVE METABOLIC PANEL WITH GFR
Calcium: 8.7 (ref 8.7–10.7)
eGFR: 88

## 2024-07-25 ENCOUNTER — Telehealth: Payer: Self-pay
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
## 2024-07-25 NOTE — Telephone Encounter (Signed)
Forms have been scanned into patient's chart.

## 2024-07-31 ENCOUNTER — Encounter: Payer: Self-pay | Admitting: Adult Health

## 2024-07-31 ENCOUNTER — Non-Acute Institutional Stay (SKILLED_NURSING_FACILITY): Payer: Self-pay | Admitting: Adult Health

## 2024-07-31 DIAGNOSIS — F02B0 Dementia in other diseases classified elsewhere, moderate, without behavioral disturbance, psychotic disturbance, mood disturbance, and anxiety: Secondary | ICD-10-CM

## 2024-07-31 DIAGNOSIS — J479 Bronchiectasis, uncomplicated: Secondary | ICD-10-CM | POA: Diagnosis not present

## 2024-07-31 DIAGNOSIS — E871 Hypo-osmolality and hyponatremia: Secondary | ICD-10-CM | POA: Diagnosis not present

## 2024-07-31 DIAGNOSIS — S12120S Other displaced dens fracture, sequela: Secondary | ICD-10-CM

## 2024-07-31 DIAGNOSIS — N400 Enlarged prostate without lower urinary tract symptoms: Secondary | ICD-10-CM | POA: Diagnosis not present

## 2024-07-31 DIAGNOSIS — R634 Abnormal weight loss: Secondary | ICD-10-CM | POA: Diagnosis not present

## 2024-07-31 DIAGNOSIS — G301 Alzheimer's disease with late onset: Secondary | ICD-10-CM | POA: Diagnosis not present

## 2024-07-31 NOTE — Progress Notes (Addendum)
 " Location:  [facility] Room Number: 156 P Place of Service:  SNF (31) Provider:  Tawni America, NP    Patient Care Team: Ransom Other, MD as PCP - General (Internal Medicine)  Extended Emergency Contact Information Primary Emergency Contact: Rentfrow,Erica Address: [address]          [place] , [place] [address] [place] Phone: [phone] Relation: Daughter Secondary Emergency Contact: [facility] Phone: [phone] Mobile Phone: [phone] Relation: Daughter  Code Status:  DNR Goals of care: Advanced Directive information    06/26/2024    2:33 PM  Advanced Directives  Does Patient Have a Medical Advance Directive? Yes  Type of Advance Directive Living will;Out of facility DNR (pink MOST or yellow form)  Does patient want to make changes to medical advance directive? No - Patient declined  Would patient like information on creating a medical advance directive? No - Patient declined  Pre-existing out of facility DNR order (yellow form or pink MOST form) Pink MOST form placed in chart (order not valid for inpatient use)     Chief Complaint  Patient presents with   Routine Visit    HPI:  Pt is a 89 y.o. male seen today for medical management of chronic diseases.    Hospitalized from 11/18-11/22 for C 2 Cervical Fracture underwent Posterior C1-2 Fusion on 06/14/24 Also had acute nasal bone fractures.  Denies any pain at this time, no numbness or tingling  He has worked with therapy and made some gaines but due to underlying Alzheimer's dementia he assessed at an enhanced assisted living level of care.  MMSE 13/30 Significant hearing loss Incontinence  Also  follows with pulmonary due to asthma with allergic phenotype, bronchiectasis, and abnormal CT scan CT scan 05/12/23 showed bilateral bronchiectasis and pulmonary nodules worsened when compared with 2022  He had stopped taking Breo for reasons that  are not clear. No current complaints of cough or sputum productions. Sats normal on RA.   Hyponatremia: was low in the hospital 129 no resolved.  NA 133 07/05/24  Past Medical History:  Diagnosis Date   BPH (benign prostatic hyperplasia)    Cataracts, bilateral    Clavicle fracture    left   COPD (chronic obstructive pulmonary disease) (HCC)    COVID-19    Dementia (HCC)    Deviated septum    GERD (gastroesophageal reflux disease)    Hyperlipemia    Moderately severe hearing loss    Pulmonary nodule    Tinnitus    Past Surgical History:  Procedure Laterality Date   APPENDECTOMY     POSTERIOR CERVICAL FUSION/FORAMINOTOMY N/A 06/14/2024   Procedure: POSTERIOR CERVICAL FUSION/FORAMINOTOMY CERVICAL ONE- TWO;  Surgeon: Darnella Dorn SAUNDERS, MD;  Location: [facility] OR;  Service: Neurosurgery;  Laterality: N/A;  POSTERIOR CERVICAL FUSION/ C1-C2   SHOULDER SURGERY Right    2003    Allergies[1]  Outpatient Encounter Medications as of 07/31/2024  Medication Sig   acetaminophen  (TYLENOL ) 500 MG tablet Take 2 tablets (1,000 mg total) by mouth 3 (three) times daily.   Cyanocobalamin  (B-12 PO) Take 1 tablet by mouth daily.   cyclobenzaprine  (FLEXERIL ) 5 MG tablet Take 1 tablet (5 mg total) by mouth 3 (three) times daily as needed for muscle spasms.   docusate sodium  (COLACE) 100 MG capsule Take 2 capsules (200 mg total) by mouth daily.   finasteride  (PROSCAR ) 5 MG tablet Take 5 mg by mouth daily.   fluticasone  (FLONASE ) 50 MCG/ACT nasal  spray Place 2 sprays into both nostrils daily.   melatonin 5 MG TABS Take 1 tablet (5 mg total) by mouth at bedtime as needed.   fluticasone  furoate-vilanterol (BREO ELLIPTA ) 100-25 MCG/ACT AEPB Inhale 1 puff into the lungs daily. (Patient not taking: Reported on 07/31/2024)   lactose free nutrition (BOOST) LIQD Take 237 mLs by mouth 2 (two) times daily between meals. (Patient not taking: Reported on 07/31/2024)   neomycin -bacitracin -polymyxin (NEOSPORIN) OINT Apply 1  Application topically 2 (two) times daily. (Patient not taking: Reported on 07/31/2024)   oxyCODONE  (OXY IR/ROXICODONE ) 5 MG immediate release tablet Take 0.5 tablets (2.5 mg total) by mouth every 6 (six) hours as needed for severe pain (pain score 7-10). (Patient not taking: Reported on 07/31/2024)   No facility-administered encounter medications on file as of 07/31/2024.    Review of Systems  Constitutional:  Negative for activity change, appetite change, chills, diaphoresis, fatigue and fever.  HENT:  Positive for hearing loss. Negative for congestion.   Respiratory:  Negative for cough, shortness of breath and wheezing.   Cardiovascular:  Negative for chest pain and leg swelling.  Gastrointestinal:  Negative for abdominal distention, abdominal pain, constipation, diarrhea, nausea and vomiting.  Genitourinary:  Negative for difficulty urinating, dysuria and urgency.  Musculoskeletal:  Positive for gait problem. Negative for back pain, myalgias and neck pain.  Skin:  Negative for rash.  Neurological:  Negative for dizziness and weakness.  Psychiatric/Behavioral:  Positive for confusion.     Immunization History  Administered Date(s) Administered   Fluad Quad(high Dose 65+) 03/08/2023   Fluzone Influenza virus vaccine,trivalent (IIV3), split virus 04/22/2009, 04/29/2010, 04/29/2011, 05/04/2012, 05/23/2013, 05/04/2014   INFLUENZA, HIGH DOSE SEASONAL PF 04/08/2020, 04/10/2021, 04/20/2022, 08/18/2023, 06/02/2024   PFIZER(Purple Top)SARS-COV-2 Vaccination 09/25/2019   PNEUMOCOCCAL CONJUGATE-20 03/31/2023   Pneumococcal Conjugate-13 05/30/2014   Pneumococcal Polysaccharide-23 04/22/2009   Tdap 02/18/2017, 10/02/2020   Pertinent  Health Maintenance Due  Topic Date Due   Influenza Vaccine  Completed      10/23/2020    2:00 AM 10/23/2020    8:13 AM 10/23/2020    7:30 PM 10/24/2020    8:39 AM 08/09/2023   10:15 AM  Fall Risk  Falls in the past year?     0  (RETIRED) Patient Fall Risk Level  High fall risk  High fall risk  High fall risk  High fall risk       Data saved with a previous flowsheet row definition   Functional Status Survey:    Vitals:   07/31/24 1032  BP: (!) 109/52  Pulse: 68  Resp: 17  Temp: 97.6 F (36.4 C)  SpO2: 99%  Weight: 163 lb 9.6 oz (74.2 kg)  Height: 6' 2.5 (1.892 m)   Body mass index is 20.72 kg/m. Physical Exam Vitals and nursing note reviewed.  Constitutional:      Appearance: Normal appearance.  HENT:     Head: Normocephalic and atraumatic.  Eyes:     Conjunctiva/sclera: Conjunctivae normal.  Cardiovascular:     Rate and Rhythm: Normal rate and regular rhythm.     Heart sounds: No murmur heard. Pulmonary:     Effort: Pulmonary effort is normal. No respiratory distress.     Breath sounds: Normal breath sounds. No wheezing.  Abdominal:     General: Bowel sounds are normal. There is no distension.     Palpations: Abdomen is soft.     Tenderness: There is no abdominal tenderness.  Musculoskeletal:     Cervical back:  No rigidity or tenderness.     Right lower leg: No edema.     Left lower leg: No edema.  Lymphadenopathy:     Cervical: No cervical adenopathy.  Skin:    General: Skin is warm and dry.  Neurological:     General: No focal deficit present.     Mental Status: He is alert. Mental status is at baseline.     Comments: Strength 4/5 BUE   Psychiatric:        Mood and Affect: Mood normal.     Labs reviewed: Recent Labs    06/14/24 0404 06/15/24 0516 06/19/24 0455 06/22/24 0433 06/26/24 0442 06/29/24 0000 07/05/24 0000  NA 132*   < > 129* 129* 129* 131* 133*  K 4.5   < > 4.1 4.2 4.0 4.7 4.4  CL 98   < > 95* 95* 97* 98* 98*  CO2 23   < > 24 27 25  27* 24*  GLUCOSE 131*   < > 94 103* 102*  --   --   BUN 10   < > 10 8 8 12 11   CREATININE 0.89   < > 0.71 0.77 0.77 0.8 0.7  CALCIUM 8.4*   < > 8.6* 8.6* 8.8* 8.9 8.7  MG 2.1  --   --   --   --   --   --    < > = values in this interval not displayed.    Recent Labs    06/14/24 0404 06/15/24 0516 06/18/24 0459  AST 20 27 34  ALT 12 12 28   ALKPHOS 97 95 89  BILITOT 0.7 1.3* 1.1  PROT 6.8 6.1* 6.4*  ALBUMIN 3.2* 2.9* 2.7*   Recent Labs    06/13/24 1848 06/14/24 0404 06/15/24 0516 06/18/24 0459 06/19/24 0455 06/22/24 0433 06/26/24 0442  WBC 11.5* 14.8*   < > 10.6* 9.2 6.1 7.5  NEUTROABS 8.9* 12.3*  --  7.7  --   --   --   HGB 13.2 13.2   < > 12.7* 13.8 13.5 14.2  HCT 39.7 39.0   < > 36.0* 39.3 39.3 41.3  MCV 96.6 96.3   < > 92.1 92.3 93.3 92.4  PLT 235 236   < > 252 261 331 373   < > = values in this interval not displayed.   No results found for: TSH No results found for: HGBA1C No results found for: CHOL, HDL, LDLCALC, LDLDIRECT, TRIG, CHOLHDL  Significant Diagnostic Results in last 30 days:  No results found.  Assessment/Plan  1. Other closed displaced odontoid fracture, sequela (Primary) Follow up apt with neurosurgery indicates proper healing Working with therapy Ready to discharged to EAL  2. Weight loss Decreased intake Will check BMP and TSH ?underlying depression?  3. Moderate late onset Alzheimer's dementia without behavioral disturbance, psychotic disturbance, mood disturbance, or anxiety (HCC) Off aricept during his last rehab stay  4. Benign prostatic hyperplasia, unspecified whether lower urinary tract symptoms present ON proscar  No issues  5. Hyponatremia Repeat BMP No longer on FR and sodium tablets.  No edema refused hose  6. Bronchiectasis No current issues ?restart Breo, has apt with pulmonary    Labs/tests ordered:  BMP TSH        [1]  Allergies Allergen Reactions   Tetracycline Swelling    Lip swelling   Codeine Other (See Comments)    Unknown reaction   Shellfish Allergy  Hives and Nausea And Vomiting   "

## 2024-08-01 ENCOUNTER — Ambulatory Visit: Admitting: Adult Health

## 2024-08-01 ENCOUNTER — Encounter: Payer: Self-pay | Admitting: Adult Health

## 2024-08-01 ENCOUNTER — Telehealth: Payer: Self-pay

## 2024-08-01 VITALS — BP 117/75 | HR 67 | Temp 97.1°F | Ht 74.0 in | Wt 169.2 lb

## 2024-08-01 DIAGNOSIS — J479 Bronchiectasis, uncomplicated: Secondary | ICD-10-CM

## 2024-08-01 DIAGNOSIS — R918 Other nonspecific abnormal finding of lung field: Secondary | ICD-10-CM | POA: Diagnosis not present

## 2024-08-01 DIAGNOSIS — J45998 Other asthma: Secondary | ICD-10-CM

## 2024-08-01 DIAGNOSIS — Z87891 Personal history of nicotine dependence: Secondary | ICD-10-CM

## 2024-08-01 DIAGNOSIS — R911 Solitary pulmonary nodule: Secondary | ICD-10-CM

## 2024-08-01 DIAGNOSIS — J309 Allergic rhinitis, unspecified: Secondary | ICD-10-CM | POA: Diagnosis not present

## 2024-08-01 DIAGNOSIS — R9389 Abnormal findings on diagnostic imaging of other specified body structures: Secondary | ICD-10-CM

## 2024-08-01 DIAGNOSIS — J4530 Mild persistent asthma, uncomplicated: Secondary | ICD-10-CM

## 2024-08-01 DIAGNOSIS — H9190 Unspecified hearing loss, unspecified ear: Secondary | ICD-10-CM

## 2024-08-01 NOTE — Telephone Encounter (Signed)
 ATC patient's daughter, Desmin Daleo, at [facility]'s request after pt's OV on 08/02/23. LVMTCB.  Attempted to contact daughter to let her know that pt states he is doing well, and that he is due for a CT scan which has been ordered for him. Also to let her know that he is to continue on the BREO inhaler.

## 2024-08-01 NOTE — Patient Instructions (Addendum)
 Set up CT chest .  Continue on Breo 1 puff daily, rinse after use  Albuterol  inhaler As needed  wheezing/shortness of breath  Claritin 10mg  daily As needed   Flonase  Nasal 2 puffs daily As needed   Follow up with Dr. Geronimo in 6 months and As needed

## 2024-08-01 NOTE — Progress Notes (Signed)
 "  @Patient  ID: Adrian Ray, male    DOB: 1933/07/31, 89 y.o.   MRN: [id number]  Chief Complaint  Patient presents with   Medical Management of Chronic Issues    CT f/u    Referring provider: No ref. provider found  HPI: 89 year old male with minimal smoking history seen for pulmonary consult October 2024 for abnormal chest x-ray and chronic cough for 2 years felt to have underlying asthma with an allergic phenotype and bronchiectasis Medical history significant for hearing impairment , dementia  Hospitalized 05/2024 for fall with C2 fracture requiring fusion     TEST/EVENTS : Reviewed 08/01/2024  Allergy  panel-IgE 1,140 positive for cat dander, dog dander, dust mites, grass, aspergillus fumigatus  Eosinophils 300   Autoimmune serology for ANA, rheumatoid factor, ESR , CCP negative.  High-resolution CT chest May 12, 2023 showed bilateral bronchiectasis and pulmonary nodules, focal area of bronchiectasis and nodularity in the right apex, scattered pulmonary nodules bilaterally.  Referred to allergist 2024   Unable to complete PFT due severe hearing impairment   CT chest March 2022 bilateral dependent atelectasis, right apical nodule measuring 6 mm  CT chest October 2024 bilateral bronchiectasis, pulmonary nodules new area of focal bronchiectasis and nodularity right apex and scattered pulmonary nodules bilaterally  CT maxillary, cervical spine June 13, 2024 acute bilateral nasal bone fractures,, C2 fracture, right severe neural foraminal stenosis, cavitary lesion versus bronchiectasis right upper lobe findings worsened since CT chest October 2024  Discussed the use of AI scribe software for clinical note transcription with the patient, who gave verbal consent to proceed.  History of Present Illness Adrian Ray is an 89 year old male with dementia presents for posthospital follow-up.  Patient was hospitalized in November after mechanical fall resulting in a C2 fracture  requiring surgical intervention with C1-C2 fusion  Patient has been discharged to assisted living at [facility].  Patient says overall he feels his breathing is doing well.  Denies any increased cough or congestion.  Patient was last seen in our office in January 2025 felt to have asthma with allergic phenotype and bronchiectasis.  Workup revealed allergy  panel with multiple positive triggers including cat dander, dog dander, dust mites, grass, Aspergillus.  IgE was very high at 1140.  Eosinophils were elevated at 300.  Patient was recommended on Breo daily.  He also was referred to allergist.  He was unable to complete pulmonary function testing due to severe hearing issues.  Patient had clinical improvement and decreased cough.  High-resolution CT chest on October 2024 showed bilateral bronchiectasis and pulmonary nodules with focal areas of bronchiectasis and nodularity in the right apex.  Patient was recommended for 61-month follow-up but unfortunately did not make that appointment.  It is unclear about patient's medication list if he is actually taking Breo since discharge.  Patient is accompanied by transportation assistant.  We have reached out to his daughter/POA for additional information but been unable to speak with her. He experiences occasional coughing. No hemoptysis, fever, or other significant symptoms. Hospitalized November 2025 after mechanical fall at home with subsequent C2 cervical fracture requiring surgical intervention with C1-C2 fusion discharged to assisted living.    Allergies[1]  Immunization History  Administered Date(s) Administered   Fluad Quad(high Dose 65+) 03/08/2023   Fluzone Influenza virus vaccine,trivalent (IIV3), split virus 04/22/2009, 04/29/2010, 04/29/2011, 05/04/2012, 05/23/2013, 05/04/2014   INFLUENZA, HIGH DOSE SEASONAL PF 04/08/2020, 04/10/2021, 04/20/2022, 08/18/2023, 06/02/2024   PFIZER(Purple Top)SARS-COV-2 Vaccination 09/25/2019   PNEUMOCOCCAL  CONJUGATE-20 03/31/2023  Pneumococcal Conjugate-13 05/30/2014   Pneumococcal Polysaccharide-23 04/22/2009   Tdap 02/18/2017, 10/02/2020    Past Medical History:  Diagnosis Date   BPH (benign prostatic hyperplasia)    Cataracts, bilateral    Clavicle fracture    left   COPD (chronic obstructive pulmonary disease) (HCC)    COVID-19    Dementia (HCC)    Deviated septum    GERD (gastroesophageal reflux disease)    Hyperlipemia    Moderately severe hearing loss    Pulmonary nodule    Tinnitus     Tobacco History: Tobacco Use History[2] Counseling given: Not Answered   Outpatient Medications Prior to Visit  Medication Sig Dispense Refill   acetaminophen  (TYLENOL ) 500 MG tablet Take 2 tablets (1,000 mg total) by mouth 3 (three) times daily.     Cyanocobalamin  (B-12 PO) Take 1 tablet by mouth daily.     cyclobenzaprine  (FLEXERIL ) 5 MG tablet Take 1 tablet (5 mg total) by mouth 3 (three) times daily as needed for muscle spasms.     docusate sodium  (COLACE) 100 MG capsule Take 2 capsules (200 mg total) by mouth daily.     finasteride  (PROSCAR ) 5 MG tablet Take 5 mg by mouth daily.     fluticasone  (FLONASE ) 50 MCG/ACT nasal spray Place 2 sprays into both nostrils daily. 16 g 10   melatonin 5 MG TABS Take 1 tablet (5 mg total) by mouth at bedtime as needed.     fluticasone  furoate-vilanterol (BREO ELLIPTA ) 100-25 MCG/ACT AEPB Inhale 1 puff into the lungs daily. (Patient not taking: Reported on 08/01/2024)     lactose free nutrition (BOOST) LIQD Take 237 mLs by mouth 2 (two) times daily between meals. (Patient not taking: Reported on 08/01/2024)     neomycin -bacitracin -polymyxin (NEOSPORIN) OINT Apply 1 Application topically 2 (two) times daily. (Patient not taking: Reported on 08/01/2024)     oxyCODONE  (OXY IR/ROXICODONE ) 5 MG immediate release tablet Take 0.5 tablets (2.5 mg total) by mouth every 6 (six) hours as needed for severe pain (pain score 7-10). (Patient not taking: Reported on  08/01/2024) 15 tablet 0   No facility-administered medications prior to visit.     Review of Systems:   Constitutional:   No  weight loss, night sweats,  Fevers, chills, fatigue, or  lassitude.  HEENT:   No headaches,  Difficulty swallowing,  Tooth/dental problems, or  Sore throat,                No sneezing, itching, ear ache, nasal congestion, post nasal drip,   CV:  No chest pain,  Orthopnea, PND, swelling in lower extremities, anasarca, dizziness, palpitations, syncope.   GI  No heartburn, indigestion, abdominal pain, nausea, vomiting, diarrhea, change in bowel habits, loss of appetite, bloody stools.   Resp: No shortness of breath with exertion or at rest.  No excess mucus, no productive cough,  No non-productive cough,  No coughing up of blood.  No change in color of mucus.  No wheezing.  No chest wall deformity  Skin: no rash or lesions.  GU: no dysuria, change in color of urine, no urgency or frequency.  No flank pain, no hematuria   MS:  No joint pain or swelling.  No decreased range of motion.  No back pain.    Physical Exam  BP 117/75   Pulse 67   Temp (!) 97.1 F (36.2 C)   Ht 6' 2 (1.88 m) Comment: Per pt  Wt 169 lb 3.2 oz (76.7 kg)  SpO2 98% Comment: RA  BMI 21.72 kg/m   GEN: A/Ox3; pleasant , NAD, well nourished    HEENT:  [place]/AT,   NOSE-clear, THROAT-clear, no lesions, no postnasal drip or exudate noted.   NECK:  Supple w/ fair ROM; no JVD; normal carotid impulses w/o bruits; no thyromegaly or nodules palpated; no lymphadenopathy.    RESP  Clear  P & A; w/o, wheezes/ rales/ or rhonchi. no accessory muscle use, no dullness to percussion  CARD:  RRR, no m/r/g, no peripheral edema, pulses intact, no cyanosis or clubbing.  GI:   Soft & nt; nml bowel sounds; no organomegaly or masses detected.   Musco: Warm bil, no deformities or joint swelling noted.   Neuro: alert, no focal deficits noted.    Skin: Warm, no lesions or rashes    Lab  Results:Reviewed 08/01/2024   CBC   BMET   BNP No results found for: BNP  ProBNP    Component Value Date/Time   PROBNP 37.0 05/03/2023 0916    Imaging: No results found.  Administration History     None           No data to display          No results found for: NITRICOXIDE      No data to display              Assessment & Plan:   Assessment and Plan Assessment & Plan Asthma with allergic phenotype-clinically appears stable.  Unclear if he is taking his maintenance inhaler.  Have recommended he continue or restart Breo.  Order to assisted living has been sent for Breo 1 puff daily.  Use albuterol  as needed.  Claritin and Flonase  daily as needed  Allergic rhinitis-appears stable. Claritin and Flonase  daily as needed  Bronchiectasis-appears clinically stable. Unable previously to do PFTs.  Follow-up CT chest is pending  Pulmonary nodularity-scattered pulmonary nodules along with bilateral bronchiectasis and focal nodularity in the right apex and right upper lobe nodule-Will need ongoing surveillance imaging.  CT chest has been recommended.   Plan  Patient Instructions  Set up CT chest .  Continue on Breo 1 puff daily, rinse after use  Albuterol  inhaler As needed  wheezing/shortness of breath  Claritin 10mg  daily As needed   Flonase  Nasal 2 puffs daily As needed   Follow up with Dr. Geronimo in 6 months and As needed            Madelin Stank, NP 08/01/2024      [1]  Allergies Allergen Reactions   Tetracycline Swelling    Lip swelling   Codeine Other (See Comments)    Unknown reaction   Shellfish Allergy  Hives and Nausea And Vomiting  [2]  Social History Tobacco Use  Smoking Status Never  Smokeless Tobacco Never   "

## 2024-08-03 ENCOUNTER — Encounter: Payer: Self-pay | Admitting: Adult Health

## 2024-08-04 MED ORDER — FLUTICASONE-SALMETEROL 250-50 MCG/ACT IN AEPB: 1.0000 | INHALATION_SPRAY | Freq: Two times a day (BID) | RESPIRATORY_TRACT | 11 refills | Status: AC

## 2024-08-04 NOTE — Telephone Encounter (Signed)
 Can try Wixela 250 1 puff Twice daily  in place of Breo .

## 2024-08-04 NOTE — Telephone Encounter (Signed)
 Please advise

## 2024-08-09 ENCOUNTER — Ambulatory Visit: Admitting: Podiatry

## 2024-08-09 DIAGNOSIS — Q828 Other specified congenital malformations of skin: Secondary | ICD-10-CM

## 2024-08-09 NOTE — Progress Notes (Signed)
 "  Subjective:  Patient ID: Adrian Ray, male    DOB: January 22, 1934,  MRN: [id number]  Chief Complaint  Patient presents with   Callouses    89 y.o. male presents with the above complaint.  Patient presents with left submetatarsal 2 porokeratotic lesion painful to touch is progressive and worsens with ambulation worse with pressure pain scale is 5 out of 10 sharp shooting in nature.  He would like to have it debrided and it does give him relief.     Review of Systems: Negative except as noted in the HPI. Denies N/V/F/Ch.  Past Medical History:  Diagnosis Date   BPH (benign prostatic hyperplasia)    Cataracts, bilateral    Clavicle fracture    left   COPD (chronic obstructive pulmonary disease) (HCC)    COVID-19    Dementia (HCC)    Deviated septum    GERD (gastroesophageal reflux disease)    Hyperlipemia    Moderately severe hearing loss    Pulmonary nodule    Tinnitus     Current Outpatient Medications:    acetaminophen  (TYLENOL ) 500 MG tablet, Take 2 tablets (1,000 mg total) by mouth 3 (three) times daily., Disp: , Rfl:    Cyanocobalamin  (B-12 PO), Take 1 tablet by mouth daily., Disp: , Rfl:    cyclobenzaprine  (FLEXERIL ) 5 MG tablet, Take 1 tablet (5 mg total) by mouth 3 (three) times daily as needed for muscle spasms., Disp: , Rfl:    docusate sodium  (COLACE) 100 MG capsule, Take 2 capsules (200 mg total) by mouth daily., Disp: , Rfl:    finasteride  (PROSCAR ) 5 MG tablet, Take 5 mg by mouth daily., Disp: , Rfl:    fluticasone  (FLONASE ) 50 MCG/ACT nasal spray, Place 2 sprays into both nostrils daily., Disp: 16 g, Rfl: 10   fluticasone -salmeterol (WIXELA INHUB) 250-50 MCG/ACT AEPB, Inhale 1 puff into the lungs in the morning and at bedtime., Disp: 1 each, Rfl: 11   lactose free nutrition (BOOST) LIQD, Take 237 mLs by mouth 2 (two) times daily between meals., Disp: , Rfl:    melatonin 5 MG TABS, Take 1 tablet (5 mg total) by mouth at bedtime as needed., Disp: , Rfl:   Social  History   Tobacco Use  Smoking Status Never  Smokeless Tobacco Never    Allergies  Allergen Reactions   Tetracycline Swelling    Lip swelling   Codeine Other (See Comments)    Unknown reaction   Shellfish Allergy  Hives and Nausea And Vomiting   Objective:  There were no vitals filed for this visit. There is no height or weight on file to calculate BMI. Constitutional Well developed. Well nourished.  Vascular Dorsalis pedis pulses palpable bilaterally. Posterior tibial pulses palpable bilaterally. Capillary refill normal to all digits.  No cyanosis or clubbing noted. Pedal hair growth normal.  Neurologic Normal speech. Oriented to person, place, and time. Epicritic sensation to light touch grossly present bilaterally.  Dermatologic -Check lesion with central nucleated core noted left submetatarsal 2 pain on palpation.  No pinpoint bleeding noted upon debridement  Orthopedic: Normal joint ROM without pain or crepitus bilaterally. No visible deformities. No bony tenderness.   Radiographs: None Assessment:   No diagnosis found.   Plan:  Patient was evaluated and treated and all questions answered.  Left submetatarsal 2 porokeratosis -All questions and concerns were discussed with the patient in extensive detail -Given the amount of pain that he is having he will benefit from debridement of the lesion, as  a courtesy using chisel blade to handle lesion with healthy dry tissue no complication noted no pinpoint bleeding noted. -I discussed shoe gear modification as well.  And offloading pads  No follow-ups on file. "

## 2024-08-10 ENCOUNTER — Ambulatory Visit (HOSPITAL_BASED_OUTPATIENT_CLINIC_OR_DEPARTMENT_OTHER)
Admission: RE | Admit: 2024-08-10 | Discharge: 2024-08-10 | Disposition: A | Discharge status: Home / Self Care | Source: Ambulatory Visit | Attending: Adult Health | Admitting: Adult Health

## 2024-08-10 DIAGNOSIS — R9389 Abnormal findings on diagnostic imaging of other specified body structures: Secondary | ICD-10-CM | POA: Diagnosis present

## 2024-08-16 ENCOUNTER — Telehealth: Payer: Self-pay

## 2024-08-16 ENCOUNTER — Encounter: Payer: Self-pay | Admitting: Podiatry

## 2024-08-16 ENCOUNTER — Other Ambulatory Visit (HOSPITAL_COMMUNITY): Payer: Self-pay

## 2024-08-16 NOTE — Telephone Encounter (Signed)
*  Pulm  Pharmacy Patient Advocate Encounter   Received notification from Fax that prior authorization for Wixela Inhub 250-50MCG/ACT aerosol powder   is required/requested.   Insurance verification completed.   The patient is insured through [employer].   Per test claim:  See below,  is preferred by the insurance.  If suggested medication is appropriate, Please send in a new RX and discontinue this one. If not, please advise as to why it's not appropriate so that we may request a Prior Authorization. Please note, some preferred medications may still require a PA.  If the suggested medications have not been trialed and there are no contraindications to their use, the PA will not be submitted, as it will not be approved. Archived Key: [id number]   Brand Breo Ellipta - $383.09 Brand Symbicort- $220.52  *patient showing a deductible needing to be met causing higher prices at this time

## 2024-08-16 NOTE — Telephone Encounter (Signed)
 Called the pt to discuss response from pharm team and there was no answer- LMTCB.

## 2024-08-16 NOTE — Telephone Encounter (Signed)
 Copied from CRM #[id number]. Topic: General - Other >> Aug 16, 2024  9:13 AM Diannia H wrote: Reason for CRM: Patients daughter Geni is calling because she is needing to speak to the provider. She is trying to get some answers about his CT scan and some other concerns. Could you assist? Erica's callback number is [phone]. She is also wanting the CT scan results added to Mychart asap. >> Aug 16, 2024  9:16 AM Diannia H wrote: Please call after 2 she is flying right now

## 2024-08-22 ENCOUNTER — Ambulatory Visit: Payer: Self-pay | Admitting: Adult Health

## 2024-08-22 ENCOUNTER — Encounter: Payer: Self-pay | Admitting: Internal Medicine

## 2024-08-22 ENCOUNTER — Telehealth: Payer: Self-pay | Admitting: Adult Health

## 2024-08-22 DIAGNOSIS — R918 Other nonspecific abnormal finding of lung field: Secondary | ICD-10-CM

## 2024-08-22 MED ORDER — AMOXICILLIN-POT CLAVULANATE 875-125 MG PO TABS: 1.0000 | ORAL_TABLET | Freq: Two times a day (BID) | ORAL | 0 refills | Status: AC

## 2024-08-22 MED ORDER — AMOXICILLIN-POT CLAVULANATE 875-125 MG PO TABS: 1.0000 | ORAL_TABLET | Freq: Two times a day (BID) | ORAL | 0 refills | Status: DC

## 2024-08-22 NOTE — Telephone Encounter (Signed)
 Tried to call Daughter Geni again x 08/22/24 [phone], straight to VM. LMOMTCB  Called Sister Hilliary again 4 , LMOMTCB   See previous office visit, phone message and result message regarding calls   If they call back please direct call to me so I can speak to directly if possible.

## 2024-08-22 NOTE — Telephone Encounter (Signed)
 Spoke with Rolland Rowels, POA   Please place on chart clinically deaf, will need to Face time on patient's phone for next office with Daughter Geni or [place] .    Advised on CT results and need for PET scan . Can we move PET scan out for 3-4 weeks  OV with Dr. Geronimo in 5 weeks for OV .  Rx for Augmentin  x 1 week  Contacted [facility]- Rx faxed to [facility], they will get rx through [employer].  Fax [phone] Attn: Ms Gailen    Can we get PET scan moved to end of FEB and Ov after with Dr. Geronimo

## 2024-08-22 NOTE — Telephone Encounter (Signed)
 Please refer to previous note and phone message as Provider and CMA tried to call POA to relay results   08/22/2024 Called Daughter [place] and [place] , Straight to VM, LMTCB .  Glad to speak with either daughter to update

## 2024-08-22 NOTE — Telephone Encounter (Signed)
 Faxed Augmentin  to [facility] and attention to Ms. Gailen, at [phone].  Fax confirmation received.

## 2024-08-22 NOTE — Telephone Encounter (Signed)
 FYI Only or Action Required?: Action required by provider: clinical question for provider and update on patient condition.  Patient is followed in Pulmonology for abn CT chest, last seen on 08/01/2024 by Parrett, Madelin RAMAN, NP.  Called Nurse Triage reporting Advice Only.   Triage Disposition: Information or Advice Only Call  Patient/caregiver understands and will follow disposition?: Yes  [place], PAS contacted NT for clarification. [place], [place]. Triage encounter created.   Reason for Disposition  Caller has medicine question only, adult not sick, AND triager answers question  Answer Assessment - Initial Assessment Questions 1. NAME of MEDICINE: What medicine(s) are you calling about?     [place], RN from [facility] [place] clinic for verification on Augmentin . Faxed for does not have diagnosis on script. Per chart,  Parrett, Madelin RAMAN, NP to [facility]       08/22/24 12:47 PM Result Note In assisted living , very hard of hearing  CT chest shows new nodularity areas that need further evaluation (recent hospitalization/SNF )  ? Infectious -Begin Augmentin  875mg  Twice daily  for 1 week. Take with food.  Set up PET scan  Need ov in 4 weeks with Dr. Geronimo for check up .   Read message to RN and she confirmed Augmentin  is for new lung nodes. Confirmed. She voiced appreciation.  Protocols used: Medication Question Call-A-AH

## 2024-08-22 NOTE — Telephone Encounter (Signed)
 Called pt to give PET appt details and daughter said they would not be going to any further appts unless a doctor calls back and tells what is going on. Daughter was frustrated and verbalized that she had reached out several times as has not heard back. Please call and advise.

## 2024-08-24 NOTE — Telephone Encounter (Signed)
 Attempted to call pt. Left voicemail for pt to call office back to get scheduled for sooner apt after PET scan.

## 2024-08-24 NOTE — Telephone Encounter (Signed)
 Copied from CRM #[id number]. Topic: Clinical - Medication Question >> Aug 18, 2024 10:18 AM Rilla NOVAK wrote: Reason for CRM: Patient's daughter returning call to [facility] regarding medication.  Please call daughter @ [phone].  Also okay to send message through MyChart.

## 2024-08-24 NOTE — Progress Notes (Signed)
 VM left for both [place] and [place].  PET is scheduled for 2/10 at [facility].  It looks like Tammy spoke with the POA, Hillary.  He needs a 4 week f/u with Dr. Geronimo.

## 2024-08-24 NOTE — Telephone Encounter (Signed)
 Copied from CRM [id number]. Topic: Clinical - Lab/Test Results >> Aug 18, 2024  2:26 PM Benton KIDD wrote: Reason for CRM: patient daughter is needing to speak with someone about her fathers ct scan . Patient daughter is concerned because there are a lot of new nodules and she is needing to speak with provider or nurse to see about a care plan or what is needing to be done  [phone] >> Aug 22, 2024  4:25 PM Dedra B wrote: Patient's daughter Rolland returning call for Tammy regarding results.   This is a duplicate message.  Message left for daughters today, 08/24/24.

## 2024-08-24 NOTE — Telephone Encounter (Signed)
 Message from results per Madelin Stank NP:    In assisted living , very hard of hearing  CT chest shows new nodularity areas that need further evaluation (recent hospitalization/SNF )  ? Infectious -Begin Augmentin  875mg  Twice daily  for 1 week. Take with food.  Set up PET scan  Need ov in 4 weeks with Dr. Geronimo for check up .   -------------------------------------------------------------------------------------------------------------------------------------------------  Stank Madelin RAMAN, NP  Nurse Practitioner Pulmonology   Telephone Encounter Signed   Encounter Date: 08/22/2024   Signed      Spoke with Rolland Rowels, POA    Please place on chart clinically deaf, will need to Face time on patient's phone for next office with Daughter Geni or [place] .      Advised on CT results and need for PET scan . Can we move PET scan out for 3-4 weeks  OV with Dr. Geronimo in 5 weeks for OV .  Rx for Augmentin  x 1 week   Contacted [facility]- Rx faxed to [facility], they will get rx through [employer].  Fax [phone] Attn: Ms Gailen      Can we get PET scan moved to end of FEB and Ov after with Dr. Geronimo           Electronically signed by Stank Madelin RAMAN, NP at 08/22/2024  4:55 PM      ATC daughter Willliam Pettet.  LVM to return call. ATC daughter Rolland Nephew.  LVM to return call.

## 2024-08-25 NOTE — Telephone Encounter (Signed)
 PET scan is scheduled for 09/05/24.  He needs a 4 week f/u with Dr. Geronimo.  Thank you.

## 2024-08-29 NOTE — Telephone Encounter (Signed)
 No response, closing request.

## 2024-08-29 NOTE — Telephone Encounter (Signed)
 Pt has been scheduled.

## 2024-09-04 ENCOUNTER — Encounter: Admitting: Adult Health

## 2024-09-05 ENCOUNTER — Encounter (HOSPITAL_COMMUNITY)

## 2024-09-26 ENCOUNTER — Ambulatory Visit: Admitting: Internal Medicine

## 2025-01-08 ENCOUNTER — Ambulatory Visit: Admitting: Internal Medicine
